# Patient Record
Sex: Male | Born: 1960
Health system: Southern US, Community
[De-identification: ages and names within clinical notes are randomized; demographics above are authoritative.]

## PROBLEM LIST (undated history)

## (undated) DIAGNOSIS — N429 Disorder of prostate, unspecified: Secondary | ICD-10-CM

## (undated) DIAGNOSIS — E785 Hyperlipidemia, unspecified: Secondary | ICD-10-CM

## (undated) DIAGNOSIS — R079 Chest pain, unspecified: Secondary | ICD-10-CM

## (undated) DIAGNOSIS — M25551 Pain in right hip: Secondary | ICD-10-CM

## (undated) DIAGNOSIS — E119 Type 2 diabetes mellitus without complications: Secondary | ICD-10-CM

## (undated) DIAGNOSIS — M1990 Unspecified osteoarthritis, unspecified site: Secondary | ICD-10-CM

## (undated) DIAGNOSIS — G4730 Sleep apnea, unspecified: Secondary | ICD-10-CM

## (undated) DIAGNOSIS — I10 Essential (primary) hypertension: Secondary | ICD-10-CM

## (undated) DIAGNOSIS — N200 Calculus of kidney: Secondary | ICD-10-CM

## (undated) HISTORY — DX: Essential (primary) hypertension: I10

## (undated) HISTORY — DX: Chest pain, unspecified: R07.9

## (undated) HISTORY — DX: Unspecified osteoarthritis, unspecified site: M19.90

## (undated) HISTORY — DX: Type 2 diabetes mellitus without complications: E11.9

## (undated) HISTORY — PX: INGUINAL HERNIA REPAIR: SUR1180

## (undated) HISTORY — PX: ROTATOR CUFF REPAIR: SHX139

## (undated) HISTORY — PX: APPENDECTOMY: SHX54

## (undated) HISTORY — DX: Pain in right hip: M25.551

## (undated) HISTORY — PX: NASAL RECONSTRUCTION: SHX2069

## (undated) HISTORY — DX: Sleep apnea, unspecified: G47.30

## (undated) HISTORY — PX: FINGER SURGERY: SHX640

## (undated) HISTORY — PX: BRAIN SURGERY: SHX531

## (undated) HISTORY — DX: Hyperlipidemia, unspecified: E78.5

## (undated) HISTORY — DX: Calculus of kidney: N20.0

## (undated) HISTORY — PX: HERNIA REPAIR: SHX51

---

## 1986-12-31 HISTORY — PX: BRAIN SURGERY: SHX531

## 1997-12-31 HISTORY — PX: OTHER SURGICAL HISTORY: SHX169

## 1998-07-28 ENCOUNTER — Emergency Department (HOSPITAL_COMMUNITY): Admission: EM | Admit: 1998-07-28 | Discharge: 1998-07-28 | Payer: Self-pay | Admitting: Emergency Medicine

## 1998-09-15 ENCOUNTER — Emergency Department (HOSPITAL_COMMUNITY): Admission: EM | Admit: 1998-09-15 | Discharge: 1998-09-15 | Payer: Self-pay | Admitting: Emergency Medicine

## 1998-10-17 ENCOUNTER — Emergency Department (HOSPITAL_COMMUNITY): Admission: EM | Admit: 1998-10-17 | Discharge: 1998-10-17 | Payer: Self-pay | Admitting: Emergency Medicine

## 1998-12-01 ENCOUNTER — Encounter: Admission: RE | Admit: 1998-12-01 | Discharge: 1998-12-01 | Payer: Self-pay | Admitting: Internal Medicine

## 1999-02-02 ENCOUNTER — Encounter: Payer: Self-pay | Admitting: Emergency Medicine

## 1999-02-02 ENCOUNTER — Emergency Department (HOSPITAL_COMMUNITY): Admission: EM | Admit: 1999-02-02 | Discharge: 1999-02-03 | Payer: Self-pay | Admitting: Emergency Medicine

## 1999-02-03 ENCOUNTER — Ambulatory Visit (HOSPITAL_COMMUNITY): Admission: RE | Admit: 1999-02-03 | Discharge: 1999-02-03 | Payer: Self-pay | Admitting: Internal Medicine

## 1999-02-03 ENCOUNTER — Encounter: Admission: RE | Admit: 1999-02-03 | Discharge: 1999-02-03 | Payer: Self-pay | Admitting: Internal Medicine

## 1999-07-13 ENCOUNTER — Encounter: Admission: RE | Admit: 1999-07-13 | Discharge: 1999-07-13 | Payer: Self-pay | Admitting: Hematology and Oncology

## 1999-07-13 ENCOUNTER — Ambulatory Visit (HOSPITAL_COMMUNITY): Admission: RE | Admit: 1999-07-13 | Discharge: 1999-07-13 | Payer: Self-pay | Admitting: Hematology and Oncology

## 2000-02-01 ENCOUNTER — Encounter: Admission: RE | Admit: 2000-02-01 | Discharge: 2000-02-01 | Payer: Self-pay | Admitting: Internal Medicine

## 2000-02-07 ENCOUNTER — Encounter: Payer: Self-pay | Admitting: Emergency Medicine

## 2000-02-07 ENCOUNTER — Emergency Department (HOSPITAL_COMMUNITY): Admission: EM | Admit: 2000-02-07 | Discharge: 2000-02-08 | Payer: Self-pay | Admitting: Emergency Medicine

## 2000-03-04 ENCOUNTER — Encounter: Admission: RE | Admit: 2000-03-04 | Discharge: 2000-03-04 | Payer: Self-pay | Admitting: Internal Medicine

## 2000-04-23 ENCOUNTER — Emergency Department (HOSPITAL_COMMUNITY): Admission: EM | Admit: 2000-04-23 | Discharge: 2000-04-23 | Payer: Self-pay | Admitting: Emergency Medicine

## 2000-04-23 ENCOUNTER — Encounter: Payer: Self-pay | Admitting: Emergency Medicine

## 2000-04-28 ENCOUNTER — Encounter: Payer: Self-pay | Admitting: *Deleted

## 2000-04-28 ENCOUNTER — Ambulatory Visit (HOSPITAL_COMMUNITY): Admission: RE | Admit: 2000-04-28 | Discharge: 2000-04-28 | Payer: Self-pay | Admitting: *Deleted

## 2000-05-30 ENCOUNTER — Encounter: Admission: RE | Admit: 2000-05-30 | Discharge: 2000-05-30 | Payer: Self-pay | Admitting: Internal Medicine

## 2000-07-01 ENCOUNTER — Encounter: Admission: RE | Admit: 2000-07-01 | Discharge: 2000-07-01 | Payer: Self-pay

## 2000-08-05 ENCOUNTER — Encounter: Admission: RE | Admit: 2000-08-05 | Discharge: 2000-08-05 | Payer: Self-pay | Admitting: Internal Medicine

## 2000-08-16 ENCOUNTER — Encounter: Admission: RE | Admit: 2000-08-16 | Discharge: 2000-08-16 | Payer: Self-pay | Admitting: Internal Medicine

## 2000-09-23 ENCOUNTER — Encounter: Admission: RE | Admit: 2000-09-23 | Discharge: 2000-09-23 | Payer: Self-pay | Admitting: Internal Medicine

## 2000-10-16 ENCOUNTER — Emergency Department (HOSPITAL_COMMUNITY): Admission: EM | Admit: 2000-10-16 | Discharge: 2000-10-16 | Payer: Self-pay | Admitting: Emergency Medicine

## 2000-10-16 ENCOUNTER — Encounter: Payer: Self-pay | Admitting: Emergency Medicine

## 2000-10-31 ENCOUNTER — Emergency Department (HOSPITAL_COMMUNITY): Admission: EM | Admit: 2000-10-31 | Discharge: 2000-10-31 | Payer: Self-pay | Admitting: Emergency Medicine

## 2000-10-31 ENCOUNTER — Encounter: Payer: Self-pay | Admitting: Emergency Medicine

## 2000-11-18 ENCOUNTER — Encounter: Admission: RE | Admit: 2000-11-18 | Discharge: 2000-11-18 | Payer: Self-pay | Admitting: Internal Medicine

## 2001-03-24 ENCOUNTER — Encounter: Admission: RE | Admit: 2001-03-24 | Discharge: 2001-03-24 | Payer: Self-pay | Admitting: Internal Medicine

## 2001-06-02 ENCOUNTER — Encounter: Admission: RE | Admit: 2001-06-02 | Discharge: 2001-06-02 | Payer: Self-pay | Admitting: Internal Medicine

## 2001-10-09 ENCOUNTER — Encounter: Admission: RE | Admit: 2001-10-09 | Discharge: 2001-10-09 | Payer: Self-pay

## 2001-12-02 ENCOUNTER — Encounter: Admission: RE | Admit: 2001-12-02 | Discharge: 2001-12-02 | Payer: Self-pay | Admitting: Internal Medicine

## 2002-03-05 ENCOUNTER — Encounter: Admission: RE | Admit: 2002-03-05 | Discharge: 2002-03-05 | Payer: Self-pay

## 2002-06-02 ENCOUNTER — Encounter: Admission: RE | Admit: 2002-06-02 | Discharge: 2002-06-02 | Payer: Self-pay | Admitting: Internal Medicine

## 2002-08-20 ENCOUNTER — Encounter: Admission: RE | Admit: 2002-08-20 | Discharge: 2002-08-20 | Payer: Self-pay | Admitting: Internal Medicine

## 2002-10-21 ENCOUNTER — Encounter: Admission: RE | Admit: 2002-10-21 | Discharge: 2002-10-21 | Payer: Self-pay | Admitting: Internal Medicine

## 2002-12-11 ENCOUNTER — Encounter: Admission: RE | Admit: 2002-12-11 | Discharge: 2002-12-11 | Payer: Self-pay | Admitting: Internal Medicine

## 2003-05-28 ENCOUNTER — Encounter: Admission: RE | Admit: 2003-05-28 | Discharge: 2003-05-28 | Payer: Self-pay | Admitting: Internal Medicine

## 2003-06-14 ENCOUNTER — Encounter: Admission: RE | Admit: 2003-06-14 | Discharge: 2003-06-14 | Payer: Self-pay | Admitting: Internal Medicine

## 2003-07-16 ENCOUNTER — Encounter: Admission: RE | Admit: 2003-07-16 | Discharge: 2003-07-16 | Payer: Self-pay | Admitting: Internal Medicine

## 2003-07-22 ENCOUNTER — Encounter: Admission: RE | Admit: 2003-07-22 | Discharge: 2003-07-22 | Payer: Self-pay | Admitting: Internal Medicine

## 2003-08-19 ENCOUNTER — Ambulatory Visit (HOSPITAL_COMMUNITY): Admission: RE | Admit: 2003-08-19 | Discharge: 2003-08-19 | Payer: Self-pay | Admitting: Cardiology

## 2003-08-19 ENCOUNTER — Encounter: Payer: Self-pay | Admitting: Cardiology

## 2003-09-02 ENCOUNTER — Encounter: Admission: RE | Admit: 2003-09-02 | Discharge: 2003-09-02 | Payer: Self-pay | Admitting: Internal Medicine

## 2003-09-02 ENCOUNTER — Ambulatory Visit (HOSPITAL_COMMUNITY): Admission: RE | Admit: 2003-09-02 | Discharge: 2003-09-02 | Payer: Self-pay | Admitting: Internal Medicine

## 2003-10-06 ENCOUNTER — Encounter: Admission: RE | Admit: 2003-10-06 | Discharge: 2003-10-06 | Payer: Self-pay | Admitting: Internal Medicine

## 2003-11-12 ENCOUNTER — Encounter: Admission: RE | Admit: 2003-11-12 | Discharge: 2003-11-12 | Payer: Self-pay | Admitting: Internal Medicine

## 2003-12-09 ENCOUNTER — Emergency Department (HOSPITAL_COMMUNITY): Admission: EM | Admit: 2003-12-09 | Discharge: 2003-12-09 | Payer: Self-pay | Admitting: Emergency Medicine

## 2003-12-16 ENCOUNTER — Encounter: Admission: RE | Admit: 2003-12-16 | Discharge: 2003-12-16 | Payer: Self-pay | Admitting: Internal Medicine

## 2003-12-30 ENCOUNTER — Encounter: Admission: RE | Admit: 2003-12-30 | Discharge: 2003-12-30 | Payer: Self-pay | Admitting: Internal Medicine

## 2004-01-21 ENCOUNTER — Encounter: Admission: RE | Admit: 2004-01-21 | Discharge: 2004-01-21 | Payer: Self-pay | Admitting: Internal Medicine

## 2004-02-22 ENCOUNTER — Emergency Department (HOSPITAL_COMMUNITY): Admission: AD | Admit: 2004-02-22 | Discharge: 2004-02-23 | Payer: Self-pay | Admitting: Emergency Medicine

## 2004-02-25 ENCOUNTER — Ambulatory Visit (HOSPITAL_COMMUNITY): Admission: RE | Admit: 2004-02-25 | Discharge: 2004-02-25 | Payer: Self-pay | Admitting: Internal Medicine

## 2004-02-25 ENCOUNTER — Encounter: Admission: RE | Admit: 2004-02-25 | Discharge: 2004-02-25 | Payer: Self-pay | Admitting: Internal Medicine

## 2004-06-01 ENCOUNTER — Encounter: Admission: RE | Admit: 2004-06-01 | Discharge: 2004-06-01 | Payer: Self-pay | Admitting: Internal Medicine

## 2004-10-18 ENCOUNTER — Ambulatory Visit: Payer: Self-pay | Admitting: Internal Medicine

## 2004-11-16 ENCOUNTER — Emergency Department (HOSPITAL_COMMUNITY): Admission: EM | Admit: 2004-11-16 | Discharge: 2004-11-16 | Payer: Self-pay | Admitting: Emergency Medicine

## 2005-01-17 ENCOUNTER — Ambulatory Visit: Payer: Self-pay | Admitting: Internal Medicine

## 2005-02-01 ENCOUNTER — Ambulatory Visit: Payer: Self-pay | Admitting: Internal Medicine

## 2005-04-06 ENCOUNTER — Ambulatory Visit: Payer: Self-pay | Admitting: Internal Medicine

## 2005-05-17 ENCOUNTER — Ambulatory Visit: Payer: Self-pay | Admitting: Internal Medicine

## 2005-05-30 ENCOUNTER — Ambulatory Visit (HOSPITAL_COMMUNITY): Admission: RE | Admit: 2005-05-30 | Discharge: 2005-05-30 | Payer: Self-pay | Admitting: Internal Medicine

## 2005-06-05 ENCOUNTER — Ambulatory Visit: Payer: Self-pay | Admitting: Internal Medicine

## 2005-06-22 ENCOUNTER — Ambulatory Visit (HOSPITAL_COMMUNITY): Admission: RE | Admit: 2005-06-22 | Discharge: 2005-06-22 | Payer: Self-pay | Admitting: Urology

## 2005-07-02 ENCOUNTER — Ambulatory Visit: Payer: Self-pay | Admitting: Internal Medicine

## 2005-07-26 ENCOUNTER — Emergency Department (HOSPITAL_COMMUNITY): Admission: EM | Admit: 2005-07-26 | Discharge: 2005-07-26 | Payer: Self-pay | Admitting: Emergency Medicine

## 2005-08-27 ENCOUNTER — Ambulatory Visit (HOSPITAL_BASED_OUTPATIENT_CLINIC_OR_DEPARTMENT_OTHER): Admission: RE | Admit: 2005-08-27 | Discharge: 2005-08-27 | Payer: Self-pay | Admitting: Surgery

## 2005-08-27 ENCOUNTER — Ambulatory Visit (HOSPITAL_COMMUNITY): Admission: RE | Admit: 2005-08-27 | Discharge: 2005-08-27 | Payer: Self-pay | Admitting: Surgery

## 2005-10-01 ENCOUNTER — Ambulatory Visit: Payer: Self-pay | Admitting: Hospitalist

## 2005-10-18 ENCOUNTER — Encounter: Payer: Self-pay | Admitting: Internal Medicine

## 2005-10-18 ENCOUNTER — Ambulatory Visit (HOSPITAL_COMMUNITY): Admission: RE | Admit: 2005-10-18 | Discharge: 2005-10-18 | Payer: Self-pay | Admitting: Gastroenterology

## 2005-12-27 ENCOUNTER — Ambulatory Visit: Payer: Self-pay | Admitting: Internal Medicine

## 2006-02-05 ENCOUNTER — Ambulatory Visit: Payer: Self-pay | Admitting: Internal Medicine

## 2006-02-27 ENCOUNTER — Ambulatory Visit: Payer: Self-pay | Admitting: Internal Medicine

## 2006-04-17 ENCOUNTER — Ambulatory Visit: Payer: Self-pay | Admitting: Internal Medicine

## 2006-04-22 ENCOUNTER — Ambulatory Visit: Payer: Self-pay | Admitting: Internal Medicine

## 2006-05-01 ENCOUNTER — Encounter: Payer: Self-pay | Admitting: Emergency Medicine

## 2006-06-06 ENCOUNTER — Ambulatory Visit: Payer: Self-pay | Admitting: Internal Medicine

## 2006-09-20 ENCOUNTER — Ambulatory Visit: Payer: Self-pay | Admitting: Hospitalist

## 2006-10-18 DIAGNOSIS — E785 Hyperlipidemia, unspecified: Secondary | ICD-10-CM

## 2006-10-18 DIAGNOSIS — D443 Neoplasm of uncertain behavior of pituitary gland: Secondary | ICD-10-CM | POA: Insufficient documentation

## 2006-10-18 DIAGNOSIS — I10 Essential (primary) hypertension: Secondary | ICD-10-CM

## 2006-10-18 DIAGNOSIS — D444 Neoplasm of uncertain behavior of craniopharyngeal duct: Secondary | ICD-10-CM

## 2006-10-18 DIAGNOSIS — G9050 Complex regional pain syndrome I, unspecified: Secondary | ICD-10-CM | POA: Insufficient documentation

## 2006-10-18 DIAGNOSIS — F411 Generalized anxiety disorder: Secondary | ICD-10-CM | POA: Insufficient documentation

## 2006-10-18 DIAGNOSIS — Z87442 Personal history of urinary calculi: Secondary | ICD-10-CM

## 2006-10-18 DIAGNOSIS — G4700 Insomnia, unspecified: Secondary | ICD-10-CM | POA: Insufficient documentation

## 2006-10-18 DIAGNOSIS — K219 Gastro-esophageal reflux disease without esophagitis: Secondary | ICD-10-CM

## 2006-10-18 HISTORY — DX: Hyperlipidemia, unspecified: E78.5

## 2006-12-03 DIAGNOSIS — G8929 Other chronic pain: Secondary | ICD-10-CM

## 2006-12-03 DIAGNOSIS — R197 Diarrhea, unspecified: Secondary | ICD-10-CM

## 2006-12-03 DIAGNOSIS — F1011 Alcohol abuse, in remission: Secondary | ICD-10-CM

## 2007-02-27 ENCOUNTER — Encounter: Payer: Self-pay | Admitting: Internal Medicine

## 2007-02-27 ENCOUNTER — Inpatient Hospital Stay (HOSPITAL_COMMUNITY): Admission: EM | Admit: 2007-02-27 | Discharge: 2007-03-02 | Payer: Self-pay | Admitting: Emergency Medicine

## 2007-03-02 ENCOUNTER — Encounter (INDEPENDENT_AMBULATORY_CARE_PROVIDER_SITE_OTHER): Payer: Self-pay | Admitting: *Deleted

## 2007-05-05 ENCOUNTER — Telehealth (INDEPENDENT_AMBULATORY_CARE_PROVIDER_SITE_OTHER): Payer: Self-pay | Admitting: *Deleted

## 2007-05-21 ENCOUNTER — Telehealth (INDEPENDENT_AMBULATORY_CARE_PROVIDER_SITE_OTHER): Payer: Self-pay | Admitting: *Deleted

## 2007-06-02 ENCOUNTER — Ambulatory Visit: Payer: Self-pay | Admitting: Internal Medicine

## 2007-06-02 DIAGNOSIS — R109 Unspecified abdominal pain: Secondary | ICD-10-CM | POA: Insufficient documentation

## 2007-06-02 DIAGNOSIS — F17200 Nicotine dependence, unspecified, uncomplicated: Secondary | ICD-10-CM

## 2008-01-15 ENCOUNTER — Telehealth: Payer: Self-pay | Admitting: Infectious Disease

## 2008-07-06 ENCOUNTER — Encounter (INDEPENDENT_AMBULATORY_CARE_PROVIDER_SITE_OTHER): Payer: Self-pay | Admitting: Internal Medicine

## 2008-07-06 ENCOUNTER — Ambulatory Visit: Payer: Self-pay | Admitting: Internal Medicine

## 2008-07-07 LAB — CONVERTED CEMR LAB
ALT: 22 units/L (ref 0–53)
AST: 17 units/L (ref 0–37)
Albumin: 4.1 g/dL (ref 3.5–5.2)
Alkaline Phosphatase: 85 units/L (ref 39–117)
BUN: 13 mg/dL (ref 6–23)
CO2: 26 meq/L (ref 19–32)
Calcium: 8.9 mg/dL (ref 8.4–10.5)
Chloride: 106 meq/L (ref 96–112)
Cholesterol: 217 mg/dL — ABNORMAL HIGH (ref 0–200)
Creatinine, Ser: 0.82 mg/dL (ref 0.40–1.50)
Glucose, Bld: 91 mg/dL (ref 70–99)
HDL: 38 mg/dL — ABNORMAL LOW (ref >39)
LDL Cholesterol: 116 mg/dL — ABNORMAL HIGH (ref 0–99)
Potassium: 4.2 meq/L (ref 3.5–5.3)
Sodium: 143 meq/L (ref 135–145)
Total Bilirubin: 0.4 mg/dL (ref 0.3–1.2)
Total CHOL/HDL Ratio: 5.7
Total Protein: 6.4 g/dL (ref 6.0–8.3)
Triglycerides: 317 mg/dL — ABNORMAL HIGH (ref <150)
VLDL: 63 mg/dL — ABNORMAL HIGH (ref 0–40)

## 2008-07-13 ENCOUNTER — Encounter (INDEPENDENT_AMBULATORY_CARE_PROVIDER_SITE_OTHER): Payer: Self-pay | Admitting: Internal Medicine

## 2008-07-13 ENCOUNTER — Ambulatory Visit: Payer: Self-pay | Admitting: Internal Medicine

## 2008-07-13 DIAGNOSIS — M25519 Pain in unspecified shoulder: Secondary | ICD-10-CM

## 2008-07-13 LAB — CONVERTED CEMR LAB
ALT: 27 units/L (ref 0–53)
AST: 21 units/L (ref 0–37)
Albumin: 4.1 g/dL (ref 3.5–5.2)
Alkaline Phosphatase: 81 units/L (ref 39–117)
BUN: 11 mg/dL (ref 6–23)
CO2: 26 meq/L (ref 19–32)
Calcium: 9.2 mg/dL (ref 8.4–10.5)
Chloride: 106 meq/L (ref 96–112)
Creatinine, Ser: 0.83 mg/dL (ref 0.40–1.50)
Glucose, Bld: 89 mg/dL (ref 70–99)
Potassium: 4.2 meq/L (ref 3.5–5.3)
Sodium: 144 meq/L (ref 135–145)
Total Bilirubin: 0.5 mg/dL (ref 0.3–1.2)
Total Protein: 6.5 g/dL (ref 6.0–8.3)

## 2008-09-06 ENCOUNTER — Encounter (INDEPENDENT_AMBULATORY_CARE_PROVIDER_SITE_OTHER): Payer: Self-pay | Admitting: Internal Medicine

## 2008-09-13 ENCOUNTER — Ambulatory Visit: Payer: Self-pay | Admitting: Internal Medicine

## 2008-09-13 ENCOUNTER — Encounter (INDEPENDENT_AMBULATORY_CARE_PROVIDER_SITE_OTHER): Payer: Self-pay | Admitting: Internal Medicine

## 2008-09-13 LAB — CONVERTED CEMR LAB

## 2008-09-14 ENCOUNTER — Encounter (INDEPENDENT_AMBULATORY_CARE_PROVIDER_SITE_OTHER): Payer: Self-pay | Admitting: Internal Medicine

## 2008-09-16 ENCOUNTER — Telehealth: Payer: Self-pay | Admitting: *Deleted

## 2008-09-27 ENCOUNTER — Encounter (INDEPENDENT_AMBULATORY_CARE_PROVIDER_SITE_OTHER): Payer: Self-pay | Admitting: Internal Medicine

## 2008-09-27 ENCOUNTER — Ambulatory Visit: Payer: Self-pay | Admitting: Internal Medicine

## 2008-09-27 ENCOUNTER — Ambulatory Visit (HOSPITAL_COMMUNITY): Admission: RE | Admit: 2008-09-27 | Discharge: 2008-09-27 | Payer: Self-pay | Admitting: Internal Medicine

## 2008-09-27 DIAGNOSIS — Z9189 Other specified personal risk factors, not elsewhere classified: Secondary | ICD-10-CM | POA: Insufficient documentation

## 2008-09-27 LAB — CONVERTED CEMR LAB
ALT: 33 units/L (ref 0–53)
AST: 18 units/L (ref 0–37)
Albumin: 4.4 g/dL (ref 3.5–5.2)
Alkaline Phosphatase: 82 units/L (ref 39–117)
BUN: 16 mg/dL (ref 6–23)
Basophils Absolute: 0 10*3/uL (ref 0.0–0.1)
Basophils Relative: 0 % (ref 0–1)
Bilirubin Urine: NEGATIVE
CO2: 22 meq/L (ref 19–32)
Calcium: 9.2 mg/dL (ref 8.4–10.5)
Chloride: 106 meq/L (ref 96–112)
Creatinine, Ser: 1 mg/dL (ref 0.40–1.50)
Eosinophils Absolute: 0.2 10*3/uL (ref 0.0–0.7)
Eosinophils Relative: 2 % (ref 0–5)
Glucose, Bld: 93 mg/dL (ref 70–99)
HCT: 44.9 % (ref 39.0–52.0)
HIV 1 RNA Quant: <50 copies/mL (ref <50)
HIV-1 RNA Quant, Log: <1.7 (ref <1.70)
Hemoglobin, Urine: NEGATIVE
Hemoglobin: 14.8 g/dL (ref 13.0–17.0)
Ketones, ur: NEGATIVE mg/dL
Leukocytes, UA: NEGATIVE
Lymphocytes Relative: 27 % (ref 12–46)
Lymphs Abs: 2.8 10*3/uL (ref 0.7–4.0)
MCHC: 33 g/dL (ref 30.0–36.0)
MCV: 95.7 fL (ref 78.0–100.0)
Monocytes Absolute: 0.7 10*3/uL (ref 0.1–1.0)
Monocytes Relative: 7 % (ref 3–12)
Neutro Abs: 6.6 10*3/uL (ref 1.7–7.7)
Neutrophils Relative %: 65 % (ref 43–77)
Nitrite: NEGATIVE
Platelets: 221 10*3/uL (ref 150–400)
Potassium: 4 meq/L (ref 3.5–5.3)
Protein, ur: NEGATIVE mg/dL
RBC: 4.69 M/uL (ref 4.22–5.81)
RDW: 13.4 % (ref 11.5–15.5)
Sodium: 143 meq/L (ref 135–145)
Specific Gravity, Urine: 1.041 — ABNORMAL HIGH (ref 1.005–1.03)
Total Bilirubin: 0.4 mg/dL (ref 0.3–1.2)
Total Protein: 6.7 g/dL (ref 6.0–8.3)
Urine Glucose: NEGATIVE mg/dL
Urobilinogen, UA: 0.2 (ref 0.0–1.0)
WBC: 10.2 10*3/uL (ref 4.0–10.5)
pH: 5.5 (ref 5.0–8.0)

## 2008-10-08 ENCOUNTER — Ambulatory Visit: Payer: Self-pay | Admitting: Internal Medicine

## 2008-10-08 DIAGNOSIS — R1319 Other dysphagia: Secondary | ICD-10-CM | POA: Insufficient documentation

## 2008-10-19 ENCOUNTER — Ambulatory Visit: Payer: Self-pay | Admitting: Internal Medicine

## 2008-10-27 ENCOUNTER — Encounter: Payer: Self-pay | Admitting: Internal Medicine

## 2008-10-27 ENCOUNTER — Ambulatory Visit: Payer: Self-pay | Admitting: Internal Medicine

## 2008-10-27 LAB — CONVERTED CEMR LAB: UREASE: NEGATIVE

## 2008-10-28 LAB — HM COLONOSCOPY

## 2008-11-01 ENCOUNTER — Encounter: Payer: Self-pay | Admitting: Internal Medicine

## 2009-05-31 ENCOUNTER — Telehealth (INDEPENDENT_AMBULATORY_CARE_PROVIDER_SITE_OTHER): Payer: Self-pay | Admitting: Internal Medicine

## 2009-06-10 ENCOUNTER — Telehealth: Payer: Self-pay | Admitting: *Deleted

## 2010-06-14 ENCOUNTER — Telehealth: Payer: Self-pay | Admitting: Internal Medicine

## 2010-07-21 ENCOUNTER — Telehealth: Payer: Self-pay | Admitting: Internal Medicine

## 2010-07-31 ENCOUNTER — Ambulatory Visit: Admission: RE | Admit: 2010-07-31 | Discharge: 2010-07-31 | Payer: Self-pay | Admitting: Internal Medicine

## 2010-07-31 ENCOUNTER — Ambulatory Visit: Payer: Self-pay | Admitting: Internal Medicine

## 2010-07-31 ENCOUNTER — Encounter: Payer: Self-pay | Admitting: Internal Medicine

## 2010-07-31 LAB — CONVERTED CEMR LAB
Amphetamine Screen, Ur: NEGATIVE
Barbiturate Quant, Ur: NEGATIVE
Benzodiazepines.: NEGATIVE
Bilirubin Urine: NEGATIVE
Casts: NONE SEEN /lpf
Cocaine Metabolites: NEGATIVE
Creatinine, Urine: 279.5 mg/dL
Creatinine,U: 283.7 mg/dL
Ketones, ur: NEGATIVE mg/dL
Leukocytes, UA: NEGATIVE
Marijuana Metabolite: NEGATIVE
Methadone: NEGATIVE
Microalb Creat Ratio: 9.3 mg/g (ref 0.0–30.0)
Microalb, Ur: 2.59 mg/dL — ABNORMAL HIGH (ref 0.00–1.89)
Nitrite: NEGATIVE
Opiates: NEGATIVE
Phencyclidine (PCP): NEGATIVE
Propoxyphene: NEGATIVE
Protein, ur: NEGATIVE mg/dL
Specific Gravity, Urine: 1.034 — ABNORMAL HIGH (ref 1.005–1.0)
Squamous Epithelial / HPF: NONE SEEN /lpf
Urine Glucose: NEGATIVE mg/dL
Urobilinogen, UA: 1 (ref 0.0–1.0)
pH: 6 (ref 5.0–8.0)

## 2010-08-09 ENCOUNTER — Telehealth: Payer: Self-pay | Admitting: *Deleted

## 2010-09-18 ENCOUNTER — Telehealth: Payer: Self-pay | Admitting: *Deleted

## 2010-09-19 ENCOUNTER — Ambulatory Visit: Payer: Self-pay | Admitting: Internal Medicine

## 2010-09-19 LAB — CONVERTED CEMR LAB
ALT: 65 units/L — ABNORMAL HIGH (ref 0–53)
AST: 35 units/L (ref 0–37)
Albumin: 4.2 g/dL (ref 3.5–5.2)
Alkaline Phosphatase: 91 units/L (ref 39–117)
BUN: 14 mg/dL (ref 6–23)
CO2: 27 meq/L (ref 19–32)
Calcium: 8.9 mg/dL (ref 8.4–10.5)
Chloride: 106 meq/L (ref 96–112)
Cholesterol: 170 mg/dL (ref 0–200)
Creatinine, Ser: 0.92 mg/dL (ref 0.40–1.50)
Glucose, Bld: 105 mg/dL — ABNORMAL HIGH (ref 70–99)
HCT: 45.4 % (ref 39.0–52.0)
HDL: 38 mg/dL — ABNORMAL LOW (ref >39)
Hemoglobin: 15.5 g/dL (ref 13.0–17.0)
LDL Cholesterol: 112 mg/dL — ABNORMAL HIGH (ref 0–99)
MCHC: 34.1 g/dL (ref 30.0–36.0)
MCV: 94 fL (ref >=78.0)
Platelets: 193 10*3/uL (ref 150–400)
Potassium: 4.2 meq/L (ref 3.5–5.3)
RBC: 4.83 M/uL (ref 4.22–5.81)
RDW: 13.1 % (ref 11.5–15.5)
Sodium: 141 meq/L (ref 135–145)
TSH: 0.898 microintl units/mL (ref 0.350–4.5)
Total Bilirubin: 0.6 mg/dL (ref 0.3–1.2)
Total CHOL/HDL Ratio: 4.5
Total Protein: 6.2 g/dL (ref 6.0–8.3)
Triglycerides: 102 mg/dL (ref <150)
VLDL: 20 mg/dL (ref 0–40)
WBC: 5.8 10*3/uL (ref 4.0–10.5)

## 2011-01-30 NOTE — Progress Notes (Signed)
Summary: refill/ hla  Phone Note Refill Request Message from:  Fax from Pharmacy on June 14, 2010 10:35 AM  Refills Requested: Medication #1:  LISINOPRIL 5 MG  TABS take 1 pill by mouth daily.   Last Refilled: 4/3 last visit 09/2008  Initial call taken by: Marin Roberts RN,  June 14, 2010 10:35 AM  Follow-up for Phone Call        Pt last seen 2009.  Will give 30 day supply.  Pt needs appt for F/U and labs.  Sent flag to [facility] Follow-up by: Blanch Media MD,  June 14, 2010 10:47 AM    Prescriptions: LISINOPRIL 5 MG  TABS (LISINOPRIL) take 1 pill by mouth daily.  #30 x 0   Entered and Authorized by:   Blanch Media MD   Signed by:   Blanch Media MD on 06/14/2010   Method used:   [facility]        [facility] [phone]* (retail)       [address]       [place], [place]  [address]       Ph: [phone] or [phone]       Fax: [phone]   RxID:   [id number]   Appended Document: refill/ hla MS Lissa Hoard called pt - no car,no driver's lincense, no money for taxi.  I sent flag to Lorri Frederick to see if any transportation resources to get pt here for F/U

## 2011-01-30 NOTE — Assessment & Plan Note (Signed)
Summary: ACUTE-GENERAL CHECK-UP UNASSIGNED/CFB   Vital Signs:  Patient profile:   51 year old male Height:      69 inches (175.26 cm) Weight:      210.2 pounds (95.55 kg) BMI:     31.15 Temp:     97.0 degrees F (36.11 degrees C) oral Pulse rate:   74 / minute BP sitting:   153 / 92  (right arm) Cuff size:   large  Vitals Entered By: Theotis Barrio NT II (July 31, 2010 3:58 PM) CC: MEDICATION REFILL / BILATERAL ANKLE AND FEET PAIN / UNABLE TO SLEEP AT NIGHT / JOINT PAIN LEFT MIDDLE FINGER / LOWER BACK PAIN / HEAT BURN Is Patient Diabetic? No Pain Assessment Patient in pain? yes     Location: BACK/ANKLE Intensity:      9 Type: PAIN Onset of pain  Chronic Nutritional Status BMI of > 30 = obese  Have you ever been in a relationship where you felt threatened, hurt or afraid?No   Does patient need assistance? Functional Status Self care Ambulation Normal   CC:  MEDICATION REFILL / BILATERAL ANKLE AND FEET PAIN / UNABLE TO SLEEP AT NIGHT / JOINT PAIN LEFT MIDDLE FINGER / LOWER BACK PAIN / HEAT BURN.  History of Present Illness: 1. HTN -was not taking any medications for "months." Denies any HA/visual changes or weakness.. No exercise and salt. 2. Heartburn. Hx of GERD. 3. Chronic foot pain.  Preventive Screening-Counseling & Management  Alcohol-Tobacco     Smoking Status: current     Smoking Cessation Counseling: yes     Packs/Day: 3 packs aday     Year Started: 33years  Caffeine-Diet-Exercise     Does Patient Exercise: no  Problems Prior to Update: 1)  Dysphagia  (ICD-787.29) 2)  Fever, Hx of  (ICD-V15.9) 3)  Preventive Health Care  (ICD-V70.0) 4)  Shoulder Pain, Left  (ICD-719.41) 5)  Tobacco Abuse  (ICD-305.1) 6)  Abdominal Pain, Chronic  (ICD-789.00) 7)  Hypertension  (ICD-401.9) 8)  Pain, Chronic Nec  (ICD-338.29) 9)  Dyslipidemia  (ICD-272.4) 10)  Gerd  (ICD-530.81) 11)  Anxiety  (ICD-300.00) 12)  Diarrhea, Chronic  (ICD-787.91) 13)  Insomnia   (ICD-780.52) 14)  Renal Calculus, Hx of  (ICD-V13.01) 15)  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) 16)  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) 17)  Abuse, Alcohol, 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)  Current Problems (verified): 1)  Dysphagia  (ICD-787.29) 2)  Fever, Hx of  (ICD-V15.9) 3)  Preventive Health Care  (ICD-V70.0) 4)  Shoulder Pain, Left  (ICD-719.41) 5)  Tobacco Abuse  (ICD-305.1) 6)  Abdominal Pain, Chronic  (ICD-789.00) 7)  Hypertension  (ICD-401.9) 8)  Pain, Chronic Nec  (ICD-338.29) 9)  Dyslipidemia  (ICD-272.4) 10)  Gerd  (ICD-530.81) 11)  Anxiety  (ICD-300.00) 12)  Diarrhea, Chronic  (ICD-787.91) 13)  Insomnia  (ICD-780.52) 14)  Renal Calculus, Hx of  (ICD-V13.01) 15)  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 Take 1 Pill By Mouth Daily. 2)  Ativan 1 Mg Tabs (Lorazepam) .... Take 1 Tablet By Mouth Once A Day 3)  Famotidine 10 Mg  Tabs (Famotidine) .... Take 1 Pill By Mouth Daily 4)  Pravachol 10 Mg  Tabs (Pravastatin Sodium) .... Take 1 Pill By Mouth Daily. 5)  Aspirin 81 Mg Chew (Aspirin) .... Take 1 Tablet By Mouth Once A Day 6)  Cardura 8 Mg  Tabs (Doxazosin Mesylate) .... Per His Urologist. 7)  Tramadol Hcl 50 Mg Tabs (Tramadol Hcl) .... Take One Tab By Mouth Q 4 Hours Prn For Pain 8)  Omeprazole 40 Mg  Cpdr (Omeprazole) .Marland Kitchen.. 1 Each Day 30 Minutes Before Meal  Allergies (verified): 1)  ! Pcn 2)  ! Codeine  Directives (verified): 1)  Full Code   Past History:  Past Medical History: Last updated: 10/08/2008 Anxiety GERD Hypertension H/O Atypical chest pain Partial small bowel obstruction 2008 Brain tumor Hyperlipidemia  Past Surgical History: Last updated: 10/08/2008 Bilateral inguinal hernia repair Appendectomy Rotator cuff surgery, right Left ankle reconstruction Brain tumor removal -  pituitary  Family History: Last updated: 07/31/2010 Grndomother: breast cancer Mother: breast cancer Father: Hypertension  Social History: Last updated: 10/08/2008 Married, 1 son, currently separated Occupation: Tax inspector buildings Patient currently smokes.  Alcohol Use - no Illicit Drug Use - no Daily Caffeine Use 6 doses  Risk Factors: Smoking Status: current (07/31/2010) Packs/Day: 3 packs aday (07/31/2010)  Family History: Grndomother: breast cancer Mother: breast cancer Father: Hypertension  Review of Systems       per HPI  Physical Exam  General:  alert, well-nourished, overweight-appearing, dusky, disheveled, and poor hygiene.   Head:  normocephalic and atraumatic.   Eyes:  pupils equal, pupils round, pupils reactive to light, pupils react to accomodation, no injection, vision impaired R, and xerophthalmia.   Ears:  no external deformities.   Nose:  no external deformity.   Mouth:  no gingival abnormalities and pharynx pink and moist.   Neck:  Supple; no masses or thyromegaly. Lungs:  Normal respiratory effort, chest expands symmetrically. Lungs are clear to auscultation, no crackles or wheezes. Heart:  Regular rate and rhythm; no murmurs, rubs,  or bruits. Abdomen:  Right inguinal hernia otherwise soft, NT, no masses BS+ no HSM Rectal:  deferred until time of colonoscopy.   Msk:  left foot lateral aspect TTP; FROM but with acute pain ln lateral flexion;no crepitus or joint instability Pulses:  R and L carotid,radial,femoral,dorsalis pedis and posterior tibial pulses are full and equal bilaterally Extremities:  No clubbing, cyanosis, edema, or deformity noted with normal full range of motion of all joints.   Neurologic:  No cranial nerve deficits noted. Station and gait are normal. Plantar reflexes are down-going bilaterally. DTRs are symmetrical throughout. Sensory, motor and coordinative functions appear intact. Skin:  no rashes Cervical Nodes:  No  lymphadenopathy noted Axillary Nodes:  No palpable lymphadenopathy Inguinal Nodes:  No significant adenopathy Psych:  Cognition and judgment appear intact. Alert and cooperative with normal attention span and concentration. No apparent delusions, illusions, hallucinations   Impression & Recommendations:  Problem # 1:  DYSLIPIDEMIA (ICD-272.4) Low cholesterol and high fiber diet along with exercise discussed. Will f/u post lab work. His updated medication list for this problem includes:    Pravachol 10 Mg Tabs (Pravastatin sodium) .Marland Kitchen... Take 1 pill by mouth daily.  Orders: T-CMP with Estimated GFR ([id number])Future Orders: T-Lipid Profile ([id number]) ... 08/01/2010 T-TSH [phone]) ... 08/01/2010  Labs Reviewed: SGOT: 18 (09/27/2008)   SGPT: 33 (09/27/2008)   HDL:38 (07/06/2008)  LDL:116 (07/06/2008)  Chol:217 (07/06/2008)  Trig:317 (07/06/2008)  Problem # 2:  HYPERTENSION (ICD-401.9) Uncontrolled due to lack of medication for 1 month. Strongly advised to adhere with Tx regimen. Weight managment and diet adressed. His updated medication list for this problem includes:    Lisinopril 5 Mg Tabs (Lisinopril) .Marland Kitchen... Take 1 pill by mouth daily.  Cardura 8 Mg Tabs (Doxazosin mesylate) .Marland Kitchen... Per his urologist.  Orders: 12 Lead EKG (12 Lead EKG) Ophthalmology Referral (Ophthalmology) T-Urine Microalbumin w/creat. ratio [phone]) T-Urinalysis ([id number]) T-CMP with Estimated GFR ([id number])Future Orders: T-CBC No Diff ([id number]) ... 08/01/2010  BP today: 153/92 Prior BP: 130/82 (10/19/2008)  Labs Reviewed: K+: 4.0 (09/27/2008) Creat: : 1.00 (09/27/2008)   Chol: 217 (07/06/2008)   HDL: 38 (07/06/2008)   LDL: 116 (07/06/2008)   TG: 317 (07/06/2008)  Problem # 3:  TOBACCO ABUSE (ICD-305.1)  Encouraged smoking cessation and discussed different methods for smoking cessation.   Future Orders: T-CBC No Diff ([id number]) ... 08/01/2010  Problem # 4:  GERD  (ICD-530.81) Denies any weight loss, hematchezia or melena. Seen GI and had upper GI work up in the past. Will continue with opmeprazole. Patient refused a repat referral for EGD/manometry. The following medications were removed from the medication list:    Famotidine 10 Mg Tabs (Famotidine) .Marland Kitchen... Take 1 pill by mouth daily His updated medication list for this problem includes:    Omeprazole 40 Mg Cpdr (Omeprazole) .Marland Kitchen... 1 each day 30 minutes before meal  EGD: Location: [facility]   (10/27/2008)  Labs Reviewed: Hgb: 14.8 (09/27/2008)   Hct: 44.9 (09/27/2008)  Problem # 5:  PAIN, CHRONIC NEC (ICD-338.29) Left foot latereal aspect pain; hx of trauma and suergery in 1999 by Dr. Eulah Pont. Patient refused any imaging at this time but agreed to be seen by Sports Medicine. d/C tramdaol since ineffective. Given saples of Celebres -side-effects reviewed. Orders: Sports Medicine (Sports Med)  Complete Medication List: 1)  Lisinopril 5 Mg Tabs (Lisinopril) .... Take 1 pill by mouth daily. 2)  Ativan 1 Mg Tabs (Lorazepam) .... Take 1 tablet by mouth once a day 3)  Pravachol 10 Mg Tabs (Pravastatin sodium) .... Take 1 pill by mouth daily. 4)  Aspirin 81 Mg Chew (Aspirin) .... Take 1 tablet by mouth once a day 5)  Cardura 8 Mg Tabs (Doxazosin mesylate) .... Per his urologist. 6)  Omeprazole 40 Mg Cpdr (Omeprazole) .Marland Kitchen.. 1 each day 30 minutes before meal  Other Orders: T-Hemoccult Card-Multiple (take home) ([id number]) T-Drug Screen-Urine, (single) [phone])  Patient Instructions: 1)  Please, return for a blood work fasting. 2)  Nurse will call you with ayour referrals to an eye doctor and sports medicine. 3)  Call with any questions. 4)  Please, follow up in 4 weeks. Prescriptions: OMEPRAZOLE 40 MG  CPDR (OMEPRAZOLE) 1 each day 30 minutes before meal  #30 x 6   Entered and Authorized by:   Deatra Robinson MD   Signed by:   Deatra Robinson MD on 07/31/2010   Method used:    [facility]        [facility] [phone]* (retail)       [address]       [place], [place]  [address]       Ph: [phone] or [phone]       Fax: [phone]   RxID:   [id number] PRAVACHOL 10 MG  TABS (PRAVASTATIN SODIUM) take 1 pill by mouth daily.  #62 x 6   Entered and Authorized by:   Deatra Robinson MD   Signed by:   Deatra Robinson MD on 07/31/2010   Method used:   Electronically to        [facility] [phone]* (retail)       [phone]  [place], [place]  [address]       Ph: [phone] or [phone]       Fax: [phone]   RxID:   [id number] LISINOPRIL 5 MG  TABS (LISINOPRIL) take 1 pill by mouth daily.  #30 x 6   Entered and Authorized by:   Deatra Robinson MD   Signed by:   Deatra Robinson MD on 07/31/2010   Method used:   Electronically to        [facility] [phone]* (retail)       [address]       [place], [place]  [address]       Ph: [phone] or [phone]       Fax: [phone]   RxID:   [id number]  Process Orders Check Orders Results:     [facility]: ABN not required for this insurance Tests Sent for requisitioning (August 02, 2010 3:26 PM):     08/01/2010: [facility] -- T-Lipid Profile [phone] (signed)     08/01/2010: [facility] -- T-CBC No Diff [[id number]] (signed)     08/01/2010: [facility] -- T-TSH [id number] (signed)     07/31/2010: [facility] -- T-Drug Screen-Urine, (single) [[id number]] (signed)     07/31/2010: [facility] -- T-Urine Microalbumin w/creat. ratio [[id number]] (signed)     07/31/2010: [facility] -- T-Urinalysis [[id number]] (signed)     07/31/2010: [facility] -- T-CMP with Estimated GFR [[id number]] (signed)     Prevention & Chronic  Care Immunizations   Influenza vaccine: Not documented   Influenza vaccine deferral: Deferred  (07/31/2010)   Influenza vaccine due: 08/31/2010    Tetanus booster: Not documented   Tetanus booster due: 07/31/2020    Pneumococcal vaccine: Not documented  Other Screening  Reports requested:   Last colonoscopy report requested.  Smoking status: current  (07/31/2010)   Smoking cessation counseling: yes  (07/31/2010)  Lipids   Total Cholesterol: 217  (07/06/2008)   LDL: 116  (07/06/2008)   LDL Direct: Not documented   HDL: 38  (07/06/2008)   Triglycerides: 317  (07/06/2008)    SGOT (AST): 18  (09/27/2008)   SGPT (ALT): 33  (09/27/2008)   Alkaline phosphatase: 82  (09/27/2008)   Total bilirubin: 0.4  (09/27/2008)    Lipid flowsheet reviewed?: Yes   Progress toward LDL goal: Unchanged    Stage of readiness to change (lipid management): Preparation  Hypertension   Last Blood Pressure: 153 / 92  (07/31/2010)   Serum creatinine: 1.00  (09/27/2008)   Serum potassium 4.0  (09/27/2008)    Hypertension flowsheet reviewed?: Yes   Progress toward BP goal: Deteriorated    Stage of readiness to change (hypertension management): Action  Self-Management Support :   Personal Goals (by the next clinic visit) :      Personal blood pressure goal: 140/90  (07/31/2010)     Personal LDL goal: 100  (07/31/2010)    Patient will work on the following items until the next clinic visit to reach self-care goals:     Medications and monitoring: take my medicines every day  (07/31/2010)     Eating: drink diet soda or water instead of juice or soda, eat more vegetables, use fresh or frozen vegetables, eat baked foods instead of fried foods, limit or avoid alcohol  (07/31/2010)    Hypertension self-management support: Resources for patients  handout  (07/31/2010)    Lipid self-management support: Resources for patients handout  (07/31/2010)     Self-management comments: UNABLE TO EXERCISE DUE  TO PAIN IN LEGS AND FEET      Resource handout printed.   Nursing Instructions: Give tetanus booster today Request report of last colonoscopy    Process Orders Check Orders Results:     [facility]: ABN not required for this insurance Tests Sent for requisitioning (August 02, 2010 3:26 PM):     08/01/2010: [facility] -- T-Lipid Profile [phone] (signed)     08/01/2010: [facility] -- T-CBC No Diff [[id number]] (signed)     08/01/2010: [facility] -- T-TSH [phone] (signed)     07/31/2010: [facility] -- T-Drug Screen-Urine, (single) [[id number]] (signed)     07/31/2010: [facility] -- T-Urine Microalbumin w/creat. ratio [[id number]] (signed)     07/31/2010: [facility] -- T-Urinalysis [[id number]] (signed)     07/31/2010: [facility] -- T-CMP with Estimated GFR [[id number]] (signed)

## 2011-01-30 NOTE — Progress Notes (Signed)
  Phone Note Outgoing Call   Call placed by: Theotis Barrio NT II,  August 09, 2010 3:18 PM Call placed to: Patient Details for Reason: SPORTS MEDICINE V/ OPTH. REFERRAL Summary of Call: SPOKE WITH MR Marrin, HE HAS NOT YET TALKED WTIH DEBORAH HILL. TO PUT REFERRALS ON HOLD UNTIL HE IS ABLE TO TALKE WITH HER.

## 2011-01-30 NOTE — Progress Notes (Signed)
Summary: call for medicine/ hla  Phone Note Call from Patient   Summary of Call: pt calls very angry, requesting his lisinopril be filled now, he is informed that the physician has denied filling any medications until he is seen in clinic. he starts to raise his voice very loudly and states "so, you people don't care if i keel over before that appt cause you wouldn't give me my medicine", i answered sir, you could keel over by taking medicine without recently being evaluated, dr Phillips Odor was present at this time. pt then hung up on me after saying," i will just go to the street and buy something to lower my blood pressure" Initial call taken by: Marin Roberts RN,  July 21, 2010 12:43 PM  Follow-up for Phone Call        Provider Notified Follow-up by: Deatra Robinson MD,  July 24, 2010 11:51 PM

## 2011-01-30 NOTE — Progress Notes (Signed)
Summary: rtc/ hla  Phone Note Call from Patient   Summary of Call: pt called, left message to rtc, called, a lady stated he was not there and she would have him call clinic Initial call taken by: Marin Roberts RN,  September 18, 2010 5:28 PM

## 2011-04-21 ENCOUNTER — Other Ambulatory Visit: Payer: Self-pay | Admitting: Internal Medicine

## 2011-04-23 NOTE — Telephone Encounter (Signed)
Last visit in 8/11 with Dr Denton Meek  OK to refill X 3 but will need appointment in the next few months

## 2011-05-18 NOTE — H&P (Signed)
[name] Acosta, [name]                ACCOUNT NO.:  [id number]   MEDICAL RECORD NO.:  [id number]          PATIENT TYPE:  EMS   LOCATION:  ED                           FACILITY:  [facility]   PHYSICIAN:  Angelia Mould. Derrell Lolling, M.D.DATE OF BIRTH:  [birth year]   DATE OF ADMISSION:  02/27/2007  DATE OF DISCHARGE:                              HISTORY & PHYSICAL   CHIEF COMPLAINT:  Abdominal pain and vomiting.   HISTORY OF PRESENT ILLNESS:  This is a 51 year old white man who states  that he began having crampy diffuse abdominal pain at about noontime  yesterday, approximately 18-20 hours ago.  He has vomited 5 times,  mostly greenish fluid.  He states he had a loose bowel movement  yesterday after the pain started and then had a normal bowel movement  the previous day.   He denies any prior GI problems like this, but on the record, he has had  an upper GI and small-bowel follow-through in 2006 ordered by Dr. Ewing Schlein,  which was normal.  He has been followed by Dr. Vic Blackbird over  the years and he is on doxazosin.  CT scans have shown a small hypodense  lesion of the left kidney for 3 or 4 years that has been stable.   He was evaluated by Dr. Effie Shy, the emergency department physician.  Abdominal x-ray showed some slightly dilated, slightly thickened small  bowel loops of uncertain etiology.  A CT scan suggested a small-bowel  obstruction with a transition in the mid small bowel, no sign of  inflammatory process.  A small 5-mm hypodense lesion of the left kidney  was noted again.  He is being admitted for further evaluation and  management.   PAST HISTORY:  1. He states he has had a brain tumor removed by Dr. Jeral Fruit.  2. Right rotator cuff surgery.  3. Left ankle reconstruction.  4. Open appendectomy.  5. Bilateral inguinal hernia repair by Dr. Daphine Deutscher.  6. Hypertension.  7. Hyperlipidemia.   CURRENT MEDICATIONS:  1. Aspirin 81 mg a day.  2. Doxazosin, unknown dose, once a day.  3.  Lorazepam 1 mg three times a day.  4. Protonix 40 mg once a day.  5. Soma 350 mg three times a day.  6. Ziac 10 mg once a day.  7. Zetia one tablet daily.   DRUG ALLERGIES:  He tells me PENICILLIN and CODEINE.   SOCIAL HISTORY:  He lives in [place].  He is married.  He has 1 child and  1 stepchild.  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  He smokes 1 pack of cigarettes per day.  He used  to drink alcohol fairly heavily, but not lately; no drinking at all now.   FAMILY HISTORY:  Mother living, had history of breast cancer and has  mental problems according to him.  Father living and has hypertension.   REVIEW OF SYSTEMS:  Fifteen-system review of systems is performed and is  noncontributory, except as described above.   PHYSICAL EXAMINATION:  GENERAL:  A thin white man in  mild to moderate  distress.  He appears somewhat sedated by the Dilaudid that he has  received prior to my arrival.  He does answer questions appropriately  and is cooperative.  VITAL SIGNS:  Temperature 97.5, blood pressure 108/73, heart rate 88 and  regular, respirations 29 and unlabored, oxygen saturation 95% on room  air.  HEENT:  Eyes:  Sclerae are clear.  Extraocular movements intact.  Ears,  nose, mouth and throat, nose, lips, tongue and oropharynx are without  gross lesions.  NECK:  Supple and nontender.  No mass.  No jugular distention.  LUNGS:  Clear to auscultation.  No chest wall tenderness.  HEART:  Regular rate and rhythm.  No murmur.  Radial and femoral pulses  are palpable.  ABDOMEN:  Not obviously distended.  Active bowel sounds.  Fairly soft,  but with mild diffuse tenderness.  I cannot get any evidence of any  rebound.  There is no percussion tenderness anywhere.  I do feel a mass.  I do not feel a hernia.  There is a transverse scar in the right lower  quadrant.  There are bilateral inguinal scars.  I do not feel an  inguinal hernia or any inguinal mass  or tenderness.  EXTREMITIES:  Moves all 4 extremities well without pain or deformity.  NEUROLOGIC:  No gross motor or sensory deficits.   ADMISSION DATA:  1. CT scan suggesting mid small bowel obstruction as described above.  2. Hemoglobin 14.6, white blood cell count 9500; differential is      normal with 72 neutrophils, 20 lymphocytes and 6 monocytes and 2      eosinophils.  Basic metabolic panel shows a sodium of 135,      potassium 3.4, chloride 97, carbon dioxide 29, glucose 108, BUN 12,      creatinine 0.86, calcium 8.8.  Liver function tests are basically      normal except for an SGOT of 59; lipase is 51.  Urinalysis is      negative.   ASSESSMENT:  1. Abdominal pain and vomiting.  Most likely diagnosis is a small-      bowel obstruction secondary to adhesions.  There is no sign of any      compromised bowel at this time, although he is still symptomatic      despite the nasogastric tube.  2. History of hypertension.  3. History of appendectomy.  4. History of bilateral inguinal hernia repair.   PLAN:  1. The patient will be admitted for bowel rest, nasogastric suction      and we will reevaluate him in 12-24 hours with followup lab and x-      ray.  He has been told that if this does not resolve spontaneously,      he may be a laparotomy.      Angelia Mould. Derrell Lolling, M.D.  Electronically Signed     HMI/MEDQ  D:  02/27/2007  T:  02/27/2007  Job:  [id number]   cc:   [facility]

## 2011-05-18 NOTE — Op Note (Signed)
NAMEJACEN, [name] Acosta                ACCOUNT NO.:  [id number]   MEDICAL RECORD NO.:  [id number]          PATIENT TYPE:  AMB   LOCATION:  NESC                         FACILITY:  [facility]   PHYSICIAN:  Thornton Park. Daphine Deutscher, MD  DATE OF BIRTH:  May 23, 1960   DATE OF PROCEDURE:  08/27/2005  DATE OF DISCHARGE:                                 OPERATIVE REPORT   PREOPERATIVE DIAGNOSIS:  Bilateral inguinal hernias.   POSTOPERATIVE DIAGNOSIS:  Bilateral direct inguinal hernias, left larger  than right, the right incarcerated and pinching hence probably causing pain.   SURGEON:  Thornton Park. Daphine Deutscher, MD   ANESTHESIA:  General endotracheal.   DESCRIPTION OF PROCEDURE:  [name] Acosta is a 51 year old man with a lot of  pain on the right side and bilateral inguinal hernias. He was taken to OR 1  at [facility] and after general anesthesia was administered and a  Foley was placed, I made a transverse incision, dissected to the left  midline, inserted a balloon and got good preperitoneal dissection. He has a  small pelvis and has had a previous appendectomy and that did limit the size  of my cavity for operating. I dissected free two hernias, one on the left  that was incarcerated and was a fairly broad direct hernia. On the right,  there was a prominent right inguinal hernia but a little more narrow and a  little more incarcerated. Both were freed. I cleaned everything up as best I  could particularly because of the inflammatory change on the right side from  his previous appendectomy but was able to identify the cord structures.  There did not appear to be __________ indirect hernias. I then placed a  piece of left large mesh tacking it a little more to the right of the  midline but I carried it out laterally. Everywhere I placed a tack, I could  palpate the tacker and it seemed to be well anterior to the anterior  superior iliac spine. I placed a single tacked laterally and tacked it along  the  iliopubic tract and then anteriorly. I covered the defect. I similarly  inserted a right large 3-D max mesh again letting them overlap and tacked it  and got good coverage of both direct defects. There was essentially minimal  bleeding noted. I then deflated the preperitoneal space. The two 5 mm  trocars that I had placed under vision using needle guide were then  withdrawn and the balloon tip trocar was removed. The fascia and the upper  midline 10 mm port was closed with a single #0 Vicryl and then 4-0 Vicryl  used to close the skin with Benzoin and Steri-Strips. The patient seemed to  tolerate the procedure well and was taken to the recovery room in  satisfactory condition. He will be given Tylox to take as needed for pain  and will be followed-up in the office in about 3 weeks.      Thornton Park Daphine Deutscher, MD  Electronically Signed     MBM/MEDQ  D:  08/27/2005  T:  08/27/2005  Job:  [id number] 

## 2011-05-18 NOTE — Discharge Summary (Signed)
NAMEPARVIN, STETZER                ACCOUNT NO.:  [id number]   MEDICAL RECORD NO.:  [id number]          PATIENT TYPE:  INP   LOCATION:  1618                         FACILITY:  [facility]   PHYSICIAN:  Angelia Mould. Derrell Lolling, M.D.DATE OF BIRTH:  January 11, 1960   DATE OF ADMISSION:  02/26/2007  DATE OF DISCHARGE:  03/02/2007                               DISCHARGE SUMMARY   FINAL DIAGNOSES:  1. Partial small-bowel obstruction, resolved.  2. Hypertension.  3. History of appendectomy.  4. History of bilateral inguinal hernia repairs.   OPERATIONS PERFORMED:  None.   HISTORY:  This is a 51 year old white man who reported crampy abdominal  pain for 24 hours.  He reported multiple episodes of bilious vomiting,  but he stated he had a loose bowel movement 1 day prior to admission.  He denies having prior GI problems, but on the hospital records, there  was a report of an upper GI small-bowel follow-through in 2006 by Dr.  Vida Rigger which was normal.   When he came to the emergency room, he was evaluated by Dr. Shelva Majestic, the  emergency department physician, and x-rays showed some slightly dilated,  slightly thickened small-bowel loops of uncertain etiology.  The CT scan  suggested a small-bowel obstruction with transition in mid small bowel  and a 5mm hypodense lesion in the left kidney which has been noted for  years and followed by Dr. Vic Blackbird.   PHYSICAL EXAMINATION:  GENERAL:  He was found to be a thin man in mild  to moderate distress.  ABDOMEN:  Was not obviously distended, had active bowel sounds.  It was  soft with mild diffuse tenderness, no rebound, no mass, no hernia.  Transverse scar in the right lower quadrant and bilateral inguinal  scars. No evidence of any recurrent hernia.   ADMISSION DATA:  Hemoglobin 14.6, white blood cell count 9500.  Chemistry studies were basically normal. Lipase 51.  Urinalysis clear   HOSPITAL COURSE:  A nasogastric tube was placed.  IV fluids were  started, and the patient was admitted.  Over the next couple days he  improved.  He did have some nausea and mild pain initially, but he got  more comfortable by  February 29 and said he passed a little bit of flatus.  His x-rays  improved showing a normal gas pattern, and his nasogastric tube was  removed.  He was discharged on March 02, 2007.  At that time, he was  tolerating oral diet and was asymptomatic.  He was given a regular diet  and allowed to go home.  He is to follow up with [place] at as needed.      Angelia Mould. Derrell Lolling, M.D.  Electronically Signed     HMI/MEDQ  D:  03/23/2007  T:  03/23/2007  Job:  [id number]   cc:   [facility]

## 2011-08-01 ENCOUNTER — Encounter: Payer: Self-pay | Admitting: Internal Medicine

## 2011-08-01 ENCOUNTER — Ambulatory Visit (INDEPENDENT_AMBULATORY_CARE_PROVIDER_SITE_OTHER): Payer: Self-pay | Admitting: Internal Medicine

## 2011-08-01 VITALS — BP 136/85 | HR 90 | Temp 97.1°F | Ht 69.0 in | Wt 215.3 lb

## 2011-08-01 DIAGNOSIS — R319 Hematuria, unspecified: Secondary | ICD-10-CM

## 2011-08-01 DIAGNOSIS — M25579 Pain in unspecified ankle and joints of unspecified foot: Secondary | ICD-10-CM

## 2011-08-01 DIAGNOSIS — M25572 Pain in left ankle and joints of left foot: Secondary | ICD-10-CM | POA: Insufficient documentation

## 2011-08-01 DIAGNOSIS — G8929 Other chronic pain: Secondary | ICD-10-CM

## 2011-08-01 DIAGNOSIS — I10 Essential (primary) hypertension: Secondary | ICD-10-CM

## 2011-08-01 DIAGNOSIS — Z0000 Encounter for general adult medical examination without abnormal findings: Secondary | ICD-10-CM | POA: Insufficient documentation

## 2011-08-01 HISTORY — DX: Other chronic pain: G89.29

## 2011-08-01 HISTORY — DX: Hematuria, unspecified: R31.9

## 2011-08-01 LAB — LIPID PANEL
Cholesterol: 180 mg/dL (ref 0–200)
Total CHOL/HDL Ratio: 5.1 Ratio
VLDL: 37 mg/dL (ref 0–40)

## 2011-08-01 LAB — CBC
Hemoglobin: 14.4 g/dL (ref 13.0–17.0)
MCH: 31.6 pg (ref 26.0–34.0)
MCHC: 33.1 g/dL (ref 30.0–36.0)
MCV: 95.4 fL (ref 78.0–100.0)
Platelets: 194 10*3/uL (ref 150–400)
RBC: 4.56 MIL/uL (ref 4.22–5.81)

## 2011-08-01 LAB — POCT URINALYSIS DIPSTICK
Glucose, UA: NEGATIVE
Leukocytes, UA: NEGATIVE
Nitrite, UA: NEGATIVE
Urobilinogen, UA: 1

## 2011-08-01 MED ORDER — TRAMADOL HCL 50 MG PO TABS: 50.0000 mg | ORAL_TABLET | Freq: Four times a day (QID) | ORAL | Status: DC | PRN

## 2011-08-01 NOTE — Assessment & Plan Note (Signed)
He reports worsening of his chronic ankle pain. He was offered some physical therapy which he refused. We'll treat his pain symptomatically with tramadol. It was noted that he has allergies to codeine and therefore he was informed to call the clinic if he has similar kind of reaction or some new rash from that medication. He was also informed to stop that medication at that time. Patient voices clear understanding.

## 2011-08-01 NOTE — Assessment & Plan Note (Signed)
Blood pressure well controlled.  Will continue the current regimen. Lab Results  Component Value Date   NA 141 09/19/2010   K 4.2 09/19/2010   CL 106 09/19/2010   CO2 27 09/19/2010   BUN 14 09/19/2010   CREATININE 0.92 09/19/2010    BP Readings from Last 3 Encounters:  08/01/11 136/85  07/31/10 153/92  10/19/08 130/82

## 2011-08-01 NOTE — Patient Instructions (Signed)
Please take your medicines as prescribed. Please call the clinic and let us know if you experience some side effects from new medicine. If you have severe reaction chest pain, SOB  then call [phone] immediately.

## 2011-08-01 NOTE — Assessment & Plan Note (Addendum)
Differentials include renal calculus versus renal cyst  Versus BPH. Given his history of renal calculus and hemorrhagic cyst ( seen on the CT scan in 2006), which is high on differential. Will get UA , CBC and BMET for him today. We will also try to get his old records from Dr. Serena Colonel at the [facility]. Patient does not have any insurance currently and does not even have orange card, so urology referral would be difficult at this time. We'll refer him to [facility] and once he gets his orange card, will get a urology referral.  His notes from [facility] were reviewed- his last cystoscopy was in June 2006 which was negative. He had lithotripsy last in 2002 and had not any stones since then.

## 2011-08-01 NOTE — Progress Notes (Signed)
  Subjective:    Patient ID: [name] Acosta, male    DOB: 1960-10-05, 51 y.o.   MRN: [id number]  HPI: 51 year old man with past medical history significant for an ankle surgery in 1999, hypertension, history of renal calculus comes to the clinic for followup visit.  He reports he had an episode of hematuria about 3 days ago which resolved by itself. He denies any associated abdominal pain, urgency , frequency , fevers, nausea or vomiting. He states he follows up with Dr. Aldean Ast for some renal cysts and thinks that it could be related to that. He also reports some fullness in his ears that started around 2 weeks ago but denies any productive cough, postnasal drip, headaches. He reports some occasional low- pitched noise in his ears and dizziness whenever he stands up but denies any complaints today. He also reports some worsening of his chronic ankle pain for last couple of weeks.    Review of Systems  Constitutional: Negative for fever, chills, activity change, appetite change and fatigue.  HENT: Negative for ear pain, tinnitus and ear discharge.   Cardiovascular: Negative for chest pain, palpitations and leg swelling.  Gastrointestinal: Negative for abdominal distention.  Genitourinary: Positive for hematuria. Negative for dysuria, frequency, flank pain, enuresis and difficulty urinating.  Musculoskeletal: Negative for arthralgias.  Neurological: Negative for dizziness, facial asymmetry, weakness and headaches.       Objective:   Physical Exam  Constitutional: He is oriented to person, place, and time. He appears well-developed and well-nourished.  HENT:  Head: Normocephalic and atraumatic.  Right Ear: External ear normal.  Left Ear: External ear normal.  Eyes: Conjunctivae and EOM are normal. Pupils are equal, round, and reactive to light. Right eye exhibits no discharge. Left eye exhibits no discharge.       Bilateral TM intact.  Neck: Normal range of motion. Neck supple. No JVD  present. No tracheal deviation present. No thyromegaly present.  Cardiovascular: Normal rate, regular rhythm, normal heart sounds and intact distal pulses.  Exam reveals no gallop and no friction rub.   No murmur heard. Pulmonary/Chest: Effort normal and breath sounds normal. No stridor. No respiratory distress. He has no wheezes. He has no rales. He exhibits no tenderness.  Abdominal: Soft. Bowel sounds are normal. He exhibits no distension and no mass. There is no tenderness. There is no rebound and no guarding.  Musculoskeletal: Normal range of motion. He exhibits no edema and no tenderness.  Lymphadenopathy:    He has no cervical adenopathy.  Neurological: He is alert and oriented to person, place, and time. He displays normal reflexes. No cranial nerve deficit. He exhibits normal muscle tone. Coordination normal.  Skin: Skin is warm.          Assessment & Plan:

## 2011-08-01 NOTE — Assessment & Plan Note (Signed)
Will check his lipid profile today.

## 2011-08-02 ENCOUNTER — Other Ambulatory Visit: Payer: Self-pay | Admitting: Internal Medicine

## 2011-08-02 LAB — COMPLETE METABOLIC PANEL WITH GFR
ALT: 39 U/L (ref 0–53)
AST: 31 U/L (ref 0–37)
CO2: 27 mEq/L (ref 19–32)
Calcium: 9.2 mg/dL (ref 8.4–10.5)
Chloride: 106 mEq/L (ref 96–112)
Creat: 0.82 mg/dL (ref 0.50–1.35)
GFR, Est African American: >60 mL/min (ref >60)
Sodium: 142 mEq/L (ref 135–145)
Total Protein: 6.5 g/dL (ref 6.0–8.3)

## 2011-08-23 ENCOUNTER — Ambulatory Visit (INDEPENDENT_AMBULATORY_CARE_PROVIDER_SITE_OTHER): Payer: Self-pay | Admitting: Internal Medicine

## 2011-08-23 VITALS — BP 143/96 | HR 88 | Temp 97.5°F | Ht 69.0 in | Wt 214.9 lb

## 2011-08-23 DIAGNOSIS — Z0000 Encounter for general adult medical examination without abnormal findings: Secondary | ICD-10-CM

## 2011-08-23 DIAGNOSIS — R319 Hematuria, unspecified: Secondary | ICD-10-CM

## 2011-08-23 DIAGNOSIS — Z23 Encounter for immunization: Secondary | ICD-10-CM

## 2011-08-23 LAB — URINALYSIS, ROUTINE W REFLEX MICROSCOPIC
Glucose, UA: NEGATIVE mg/dL
Leukocytes, UA: NEGATIVE
Nitrite: NEGATIVE
pH: 6 (ref 5.0–8.0)

## 2011-08-23 NOTE — Progress Notes (Signed)
  Subjective:    Patient ID: [name] Acosta, male    DOB: 02-04-1960, 51 y.o.   MRN: [id number]  HPI: 51 year old man with past medical history significant for hypertension, hyperlipidemia, renal calculus comes to the clinic for a followup visit.  Patient was recently seen in the clinic about 3 weeks ago for hematuria. He denies any further episodes. He occasionally has abdominal pain and diarrhea. He takes Imodium every day which helps him with his diarrhea.   Denies any symptoms including hesitancy, frequency, urgency, fever, chills nausea or vomiting.         Review of Systems  Constitutional: Negative for fever, chills and diaphoresis.  HENT: Negative for nosebleeds, congestion, sore throat, rhinorrhea, dental problem and postnasal drip.   Eyes: Negative for visual disturbance.  Respiratory: Negative for apnea, cough, choking, chest tightness, shortness of breath, wheezing and stridor.   Cardiovascular: Negative for chest pain, palpitations and leg swelling.  Gastrointestinal: Negative for nausea, abdominal pain and constipation.  Genitourinary: Negative for dysuria, urgency, hematuria and flank pain.  Musculoskeletal: Positive for arthralgias.  Neurological: Negative for dizziness.  Hematological: Negative for adenopathy.       Objective:   Physical Exam  Constitutional: He is oriented to person, place, and time. He appears well-developed and well-nourished. No distress.  HENT:  Head: Normocephalic.  Eyes: Conjunctivae and EOM are normal. Pupils are equal, round, and reactive to light.  Neck: Normal range of motion. Neck supple. No JVD present. No tracheal deviation present. No thyromegaly present.  Cardiovascular: Normal rate, regular rhythm and intact distal pulses.  Exam reveals no gallop and no friction rub.   No murmur heard. Pulmonary/Chest: Effort normal and breath sounds normal. No stridor. No respiratory distress. He has no wheezes. He has no rales. He exhibits no  tenderness.  Abdominal: Soft. Bowel sounds are normal. He exhibits no distension. There is no tenderness. There is no rebound and no guarding.  Musculoskeletal: Normal range of motion. He exhibits no edema and no tenderness.  Lymphadenopathy:    He has no cervical adenopathy.  Neurological: He is alert and oriented to person, place, and time. He has normal reflexes. He displays normal reflexes. No cranial nerve deficit. He exhibits normal muscle tone. Coordination normal.  Skin: Skin is warm. He is not diaphoretic.          Assessment & Plan:

## 2011-08-23 NOTE — Assessment & Plan Note (Signed)
Followup on hematuria-no new episodes. Denies any complaints including dysuria, hesitancy , urgency or frequency. After discussing with Dr. Phillips Odor, -Recheck UA. -Will get a PSA and urine cytology. -Has not seen Rudell Cobb since last clinic appointment ( with no insurance and orange card- urology referral could not be placed at this visit as well) -If he has new episodes of hematuria -he was advised to call the clinic- we may consider getting a CT scan at that time.  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 - a small 0.3 mm calculus in left kidney - Urine cytology in 2009 was negative.

## 2011-08-23 NOTE — Assessment & Plan Note (Signed)
He got a flu shot and Tdap. States that he had colonoscopy 3-4 years ago. Will try to obtain those records.

## 2011-08-24 ENCOUNTER — Other Ambulatory Visit (HOSPITAL_COMMUNITY)
Admission: RE | Admit: 2011-08-24 | Discharge: 2011-08-24 | Disposition: A | Discharge status: Home / Self Care | Payer: Self-pay | Source: Ambulatory Visit | Attending: Internal Medicine | Admitting: Internal Medicine

## 2011-08-24 DIAGNOSIS — Z23 Encounter for immunization: Secondary | ICD-10-CM

## 2011-08-24 DIAGNOSIS — R319 Hematuria, unspecified: Secondary | ICD-10-CM | POA: Insufficient documentation

## 2011-08-24 DIAGNOSIS — Z0000 Encounter for general adult medical examination without abnormal findings: Secondary | ICD-10-CM

## 2011-08-24 LAB — URINALYSIS, MICROSCOPIC ONLY
Bacteria, UA: NONE SEEN
Casts: NONE SEEN
Crystals: NONE SEEN

## 2011-08-24 LAB — PSA: PSA: 0.4 ng/mL (ref <=4.00)

## 2011-08-24 NOTE — Patient Instructions (Signed)
Please schedule a follow up appointment in 1 month. Please take your medicines as prescribed. 

## 2011-12-10 ENCOUNTER — Other Ambulatory Visit: Payer: Self-pay | Admitting: *Deleted

## 2011-12-10 DIAGNOSIS — I10 Essential (primary) hypertension: Secondary | ICD-10-CM

## 2011-12-10 MED ORDER — LISINOPRIL 5 MG PO TABS: 5.0000 mg | ORAL_TABLET | Freq: Every day | ORAL | Status: DC

## 2012-02-01 ENCOUNTER — Encounter: Payer: Self-pay | Admitting: Internal Medicine

## 2012-02-01 ENCOUNTER — Ambulatory Visit (INDEPENDENT_AMBULATORY_CARE_PROVIDER_SITE_OTHER): Payer: Self-pay | Admitting: Internal Medicine

## 2012-02-01 DIAGNOSIS — E785 Hyperlipidemia, unspecified: Secondary | ICD-10-CM

## 2012-02-01 DIAGNOSIS — F17200 Nicotine dependence, unspecified, uncomplicated: Secondary | ICD-10-CM

## 2012-02-01 DIAGNOSIS — M25519 Pain in unspecified shoulder: Secondary | ICD-10-CM

## 2012-02-01 DIAGNOSIS — I10 Essential (primary) hypertension: Secondary | ICD-10-CM

## 2012-02-01 MED ORDER — LISINOPRIL 5 MG PO TABS: 10.0000 mg | ORAL_TABLET | Freq: Every day | ORAL | Status: DC

## 2012-02-01 MED ORDER — PRAVASTATIN SODIUM 10 MG PO TABS: 10.0000 mg | ORAL_TABLET | Freq: Every day | ORAL | Status: DC

## 2012-02-01 NOTE — Patient Instructions (Signed)
Please schedule a follow up appointment in 3 months or earlier if needed. Please take your medicines as prescribed. Please contact our financial advisor - Debra hill for orange card.

## 2012-02-01 NOTE — Assessment & Plan Note (Signed)
Counseled on tobacco cessation. Various aids were discussed.

## 2012-02-01 NOTE — Assessment & Plan Note (Signed)
His shoulder pain is most likely thought to be related to degenerative arthritis. -Advised to use heat compresses. -He was advised to use NSAIDS on his bad days. - Refer  him for physical therapy

## 2012-02-01 NOTE — Assessment & Plan Note (Signed)
Lab Results  Component Value Date   NA 142 08/01/2011   K 3.6 08/01/2011   CL 106 08/01/2011   CO2 27 08/01/2011   BUN 19 08/01/2011   CREATININE 0.82 08/01/2011   CREATININE 0.92 09/19/2010    BP Readings from Last 3 Encounters:  02/01/12 144/92  08/23/11 143/96  08/01/11 136/85    Assessment: Hypertension control:  mildly elevated  Progress toward goals:  deteriorated Barriers to meeting goals:  no barriers identified  Plan: Hypertension treatment:  Will increase the dose of his lisinopril.

## 2012-02-01 NOTE — Progress Notes (Signed)
  Subjective:    Patient ID: [name] Acosta, male    DOB: Sep 04, 1960, 52 y.o.   MRN: [id number]  HPI: 52 year old man with medical history significant for hypertension, hyperlipidemia comes to the clinic for a followup visit.  He states that he has been having increased pain in his left shoulder for last few months. He denies any associated tingling and numbness.  He also reports having increased pain in his hands and knee causing him some difficulty with some daily activities.  He continues to smoke 1-1/2 pack everyday.    Review of Systems  Constitutional: Negative for fever, appetite change, fatigue and unexpected weight change.  HENT: Negative for nosebleeds, congestion, rhinorrhea, sneezing, neck pain and postnasal drip.   Eyes: Negative for visual disturbance.  Respiratory: Negative for apnea, cough, choking, chest tightness and shortness of breath.   Gastrointestinal: Negative for abdominal pain.  Genitourinary: Negative for dysuria and flank pain.  Musculoskeletal: Negative for arthralgias.  Neurological: Negative for dizziness, facial asymmetry, light-headedness, numbness and headaches.  Hematological: Negative for adenopathy.  Psychiatric/Behavioral: Negative for agitation.       Objective:   Physical Exam  Constitutional: He is oriented to person, place, and time. He appears well-developed and well-nourished. No distress.  HENT:  Head: Normocephalic and atraumatic.  Mouth/Throat: No oropharyngeal exudate.  Eyes: Conjunctivae and EOM are normal. Pupils are equal, round, and reactive to light. No scleral icterus.  Neck: Normal range of motion. Neck supple. No JVD present. No tracheal deviation present. No thyromegaly present.  Cardiovascular: Normal rate, regular rhythm, normal heart sounds and intact distal pulses.  Exam reveals no gallop and no friction rub.   No murmur heard. Pulmonary/Chest: Effort normal and breath sounds normal. No stridor. No respiratory distress.  He has no wheezes. He has no rales. He exhibits no tenderness.  Abdominal: Soft. Bowel sounds are normal. He exhibits no distension and no mass. There is no tenderness. There is no rebound and no guarding.  Musculoskeletal: He exhibits no edema and no tenderness.       Left shoulder tender to palpation, restricted ROM.  Lymphadenopathy:    He has no cervical adenopathy.  Neurological: He is alert and oriented to person, place, and time. He has normal reflexes. He displays normal reflexes. No cranial nerve deficit. He exhibits normal muscle tone. Coordination normal.  Skin: Skin is warm. He is not diaphoretic.          Assessment & Plan:

## 2012-02-07 ENCOUNTER — Other Ambulatory Visit: Payer: Self-pay | Admitting: Internal Medicine

## 2012-02-12 ENCOUNTER — Other Ambulatory Visit: Payer: Self-pay | Admitting: Internal Medicine

## 2012-02-12 DIAGNOSIS — I10 Essential (primary) hypertension: Secondary | ICD-10-CM

## 2012-02-12 MED ORDER — LISINOPRIL 10 MG PO TABS: 10.0000 mg | ORAL_TABLET | Freq: Every day | ORAL | Status: DC

## 2012-06-02 ENCOUNTER — Other Ambulatory Visit: Payer: Self-pay | Admitting: *Deleted

## 2012-06-02 DIAGNOSIS — N400 Enlarged prostate without lower urinary tract symptoms: Secondary | ICD-10-CM

## 2012-06-02 MED ORDER — DOXAZOSIN MESYLATE 4 MG PO TABS: 4.0000 mg | ORAL_TABLET | Freq: Two times a day (BID) | ORAL | Status: DC

## 2012-06-02 NOTE — Telephone Encounter (Signed)
ERROR

## 2012-06-02 NOTE — Telephone Encounter (Signed)
Cardura rx refilled - request form faxed to [facility] .

## 2012-06-02 NOTE — Telephone Encounter (Signed)
Requesting 90-day supply per fax from [facility].

## 2012-08-23 ENCOUNTER — Other Ambulatory Visit: Payer: Self-pay | Admitting: Internal Medicine

## 2012-08-23 DIAGNOSIS — E785 Hyperlipidemia, unspecified: Secondary | ICD-10-CM

## 2012-09-30 ENCOUNTER — Other Ambulatory Visit: Payer: Self-pay | Admitting: Internal Medicine

## 2012-11-27 ENCOUNTER — Other Ambulatory Visit: Payer: Self-pay | Admitting: Internal Medicine

## 2012-12-01 ENCOUNTER — Encounter: Payer: Self-pay | Admitting: Internal Medicine

## 2012-12-01 ENCOUNTER — Other Ambulatory Visit: Payer: Self-pay | Admitting: Internal Medicine

## 2012-12-01 ENCOUNTER — Ambulatory Visit (INDEPENDENT_AMBULATORY_CARE_PROVIDER_SITE_OTHER): Payer: Self-pay | Admitting: Internal Medicine

## 2012-12-01 VITALS — BP 137/92 | HR 103 | Temp 97.7°F | Ht 69.0 in | Wt 234.0 lb

## 2012-12-01 DIAGNOSIS — R319 Hematuria, unspecified: Secondary | ICD-10-CM

## 2012-12-01 DIAGNOSIS — R635 Abnormal weight gain: Secondary | ICD-10-CM

## 2012-12-01 DIAGNOSIS — M25579 Pain in unspecified ankle and joints of unspecified foot: Secondary | ICD-10-CM

## 2012-12-01 DIAGNOSIS — M25572 Pain in left ankle and joints of left foot: Secondary | ICD-10-CM

## 2012-12-01 DIAGNOSIS — J309 Allergic rhinitis, unspecified: Secondary | ICD-10-CM | POA: Insufficient documentation

## 2012-12-01 DIAGNOSIS — Z23 Encounter for immunization: Secondary | ICD-10-CM

## 2012-12-01 DIAGNOSIS — F17200 Nicotine dependence, unspecified, uncomplicated: Secondary | ICD-10-CM

## 2012-12-01 DIAGNOSIS — I10 Essential (primary) hypertension: Secondary | ICD-10-CM

## 2012-12-01 DIAGNOSIS — E785 Hyperlipidemia, unspecified: Secondary | ICD-10-CM

## 2012-12-01 DIAGNOSIS — Z0000 Encounter for general adult medical examination without abnormal findings: Secondary | ICD-10-CM

## 2012-12-01 DIAGNOSIS — J329 Chronic sinusitis, unspecified: Secondary | ICD-10-CM

## 2012-12-01 MED ORDER — LISINOPRIL 10 MG PO TABS: 10.0000 mg | ORAL_TABLET | Freq: Every day | ORAL | Status: DC

## 2012-12-01 MED ORDER — MOMETASONE FUROATE 50 MCG/ACT NA SUSP: 2.0000 | Freq: Every day | NASAL | Status: DC

## 2012-12-01 NOTE — Assessment & Plan Note (Signed)
Check lipid panel and CMP. Await results to make any further changes in the regimen.

## 2012-12-01 NOTE — Telephone Encounter (Signed)
Betty at [facility], [place], confirms they have already received the Cardura 4 mg, 1 BID ,#180 with 1 refill Rx - Dorie Rank, RN, 12/01/2012, 5:05P

## 2012-12-01 NOTE — Patient Instructions (Addendum)
General Instructions:  Please schedule a follow up appointment in 3 months or sooner if needed . Please bring your medication bottles with your next appointment. Please take your medicines as prescribed. I will call you with your lab results if anything will be abnormal.

## 2012-12-01 NOTE — Assessment & Plan Note (Signed)
Blood pressure mildly elevated. Continue current regimen for now.  BP Readings from Last 3 Encounters:  12/01/12 137/92  02/01/12 144/92  08/23/11 143/96

## 2012-12-01 NOTE — Assessment & Plan Note (Signed)
He was counseled on tobacco cessation. He is planning to try electronic cigarettes but is not ready to quit completely. He was given the number for 1-800- QUIT- NOW. Continue to counsel him at future visits.

## 2012-12-01 NOTE — Assessment & Plan Note (Signed)
Continues to complain of worsening chronic left ankle pain- that usually flares up with seasonal changes.  - Advised to use heat or ice - Agree with Tylenol or Ibuprofen for PRN worse pain

## 2012-12-01 NOTE — Assessment & Plan Note (Signed)
He reports increased trouble with rhinorrhea, postnasal drip and sneezing recently. On exam he was found to have some maxillary tenderness. -Advised steam inhalation and warm saline gargles - Nasonex spray for congestion

## 2012-12-01 NOTE — Progress Notes (Signed)
Subjective:   Patient ID: [name] Acosta male   DOB: 23-Jan-1960 52 y.o.   MRN: [id number]  HPI: 52 year old man with past medical history significant for hypertension, hyperlipidemia, craniopharyngioma status post resection in 1988 comes to the clinic for a followup visit.  Patient reports having reconstruction surgery in his left ankle in 1999 and has been having problems with left ankle pain ever since then. He describes his pain as sharp or penetrating pain, on his worse days he rates his pain 8/10 with today being one of them. Denies any swelling or redness associated with it. States that Tylenol or ibuprofen helps a little bit but tramadol doesn't  He also reports having problems with his sinuses not since his surgery for craniopharyngioma. His symptoms include postnasal drip, sneezing and rhinorrhea.  Denies having any urinary complaints or having any further episodes of hematuria.   He has gained 24 lbs since his last visit in 02/13.   History   Social History  . Marital Status: Legally Separated    Spouse Name: N/A    Number of Children: N/A  . Years of Education: N/A   Occupational History  . Not on file.   Social History Main Topics  . Smoking status: Current Every Day Smoker -- 1.0 packs/day    Types: Cigarettes  . Smokeless tobacco: Not on file  . Alcohol Use: Not on file  . Drug Use: Not on file  . Sexually Active: Not on file   Other Topics Concern  . Not on file   Social History Narrative  . No narrative on file   Review of Systems: General: Denies fever, chills, diaphoresis, appetite change and fatigue. HEENT: Denies photophobia, eye pain, redness, hearing loss, ear pain, congestion, sore throat, , mouth sores, trouble swallowing, neck pain, neck stiffness and tinnitus.+ postnasal drip, + sneezing, + rhinorrhea  Respiratory: Denies SOB, DOE, cough, chest tightness, and wheezing. Cardiovascular: Denies to chest pain, palpitations and leg  swelling. Gastrointestinal: Denies nausea, vomiting, abdominal pain, diarrhea, constipation, blood in stool and abdominal distention. Genitourinary: Denies dysuria, urgency, frequency, hematuria, flank pain and difficulty urinating. Musculoskeletal: Denies myalgias, back pain, joint swelling, arthralgias and gait problem.  Skin: Denies pallor, rash and wound. Neurological: Denies dizziness, seizures, syncope, weakness, light-headedness, numbness and headaches. Hematological: Denies adenopathy, easy bruising, personal or family bleeding history. Psychiatric/Behavioral: Denies suicidal ideation, mood changes, confusion, nervousness, sleep disturbance and agitation.    Current Outpatient Medications: Current Outpatient Prescriptions  Medication Sig Dispense Refill  . doxazosin (CARDURA) 4 MG tablet TAKE 1 TABLET BY MOUTH TWICE A DAY  180 tablet  1  . lisinopril (PRINIVIL,ZESTRIL) 10 MG tablet TAKE 1 TABLET BY MOUTH EVERY DAY  30 tablet  3  . pravastatin (PRAVACHOL) 10 MG tablet Take 1 tablet (10 mg total) by mouth daily.  62 tablet  4  . pravastatin (PRAVACHOL) 10 MG tablet TAKE 1 TABLET BY MOUTH EVERY DAY  60 tablet  3  . [DISCONTINUED] doxazosin (CARDURA) 4 MG tablet TAKE 1 TABLET BY MOUTH TWICE A DAY  180 tablet  1    Allergies: Allergies  Allergen Reactions  . Codeine     REACTION: Unknown reaction  . Penicillins     REACTION: Unknown reaction      Objective:   Physical Exam: There were no vitals filed for this visit.  General: Vital signs reviewed and noted. Well-developed, well-nourished, in no acute distress; alert, appropriate and cooperative throughout examination. HEENT: Normocephalic, atraumatic, mild maxillary tenderness Lungs: Normal  respiratory effort. Clear to auscultation BL without crackles or wheezes. Heart: RRR. S1 and S2 normal without gallop, murmur, or rubs. Abdomen:BS normoactive. Soft, Nondistended, non-tender.  No masses or organomegaly. Extremities: No  pretibial edema. Left ankle non tender, no erythema or warmth.      Assessment & Plan:

## 2012-12-02 ENCOUNTER — Encounter: Payer: Self-pay | Admitting: Internal Medicine

## 2012-12-02 DIAGNOSIS — R635 Abnormal weight gain: Secondary | ICD-10-CM | POA: Insufficient documentation

## 2012-12-02 LAB — LIPID PANEL
Cholesterol: 164 mg/dL (ref 0–200)
HDL: 38 mg/dL — ABNORMAL LOW (ref >39)
Total CHOL/HDL Ratio: 4.3 Ratio
Triglycerides: 166 mg/dL — ABNORMAL HIGH (ref <150)

## 2012-12-02 LAB — COMPLETE METABOLIC PANEL WITH GFR
Alkaline Phosphatase: 79 U/L (ref 39–117)
Creat: 1.04 mg/dL (ref 0.50–1.35)
GFR, Est African American: >89 mL/min
GFR, Est Non African American: 82 mL/min
Glucose, Bld: 119 mg/dL — ABNORMAL HIGH (ref 70–99)
Sodium: 142 mEq/L (ref 135–145)
Total Bilirubin: 0.5 mg/dL (ref 0.3–1.2)
Total Protein: 6.4 g/dL (ref 6.0–8.3)

## 2012-12-02 LAB — CBC
HCT: 44.1 % (ref 39.0–52.0)
MCH: 32.3 pg (ref 26.0–34.0)
MCHC: 34.7 g/dL (ref 30.0–36.0)
MCV: 93.2 fL (ref 78.0–100.0)
Platelets: 201 10*3/uL (ref 150–400)
RDW: 13.1 % (ref 11.5–15.5)

## 2012-12-02 NOTE — Assessment & Plan Note (Signed)
He has gained ~25 lbs since his last visit. We discussed lifestyle changes including diet and exercise during the visit. - Would also check TSH.

## 2013-01-28 ENCOUNTER — Other Ambulatory Visit: Payer: Self-pay | Admitting: Internal Medicine

## 2013-02-03 ENCOUNTER — Other Ambulatory Visit: Payer: Self-pay | Admitting: Internal Medicine

## 2013-06-03 ENCOUNTER — Telehealth: Payer: Self-pay | Admitting: *Deleted

## 2013-06-03 ENCOUNTER — Encounter (HOSPITAL_COMMUNITY): Payer: Self-pay | Admitting: Emergency Medicine

## 2013-06-03 ENCOUNTER — Emergency Department (HOSPITAL_COMMUNITY)
Admission: EM | Admit: 2013-06-03 | Discharge: 2013-06-03 | Disposition: A | Discharge status: Home / Self Care | Payer: Self-pay | Source: Home / Self Care | Attending: Emergency Medicine | Admitting: Emergency Medicine

## 2013-06-03 DIAGNOSIS — R5381 Other malaise: Secondary | ICD-10-CM

## 2013-06-03 DIAGNOSIS — R5383 Other fatigue: Secondary | ICD-10-CM

## 2013-06-03 DIAGNOSIS — M1990 Unspecified osteoarthritis, unspecified site: Secondary | ICD-10-CM

## 2013-06-03 DIAGNOSIS — M129 Arthropathy, unspecified: Secondary | ICD-10-CM

## 2013-06-03 HISTORY — DX: Disorder of prostate, unspecified: N42.9

## 2013-06-03 LAB — POCT I-STAT, CHEM 8
HCT: 47 % (ref 39.0–52.0)
Hemoglobin: 16 g/dL (ref 13.0–17.0)
Potassium: 3.9 mEq/L (ref 3.5–5.1)
Sodium: 143 mEq/L (ref 135–145)

## 2013-06-03 MED ORDER — NAPROXEN 500 MG PO TABS: 500.0000 mg | ORAL_TABLET | Freq: Two times a day (BID) | ORAL | Status: DC

## 2013-06-03 NOTE — ED Notes (Signed)
Blood drawn for Lyme IgC-IgM by Neill Loft.

## 2013-06-03 NOTE — ED Provider Notes (Addendum)
History     CSN: [id number]  Arrival date & time 06/03/13  1612   First MD Initiated Contact with Patient 06/03/13 1659      Chief Complaint  Patient presents with  . Insect Bite    (Consider location/radiation/quality/duration/timing/severity/associated sxs/prior treatment) HPI Comments: Pt presents c/o potential lyme disease.  For 2 weeks, he has had some generalized joint stiffness, mild intermittent headaches, a lack of energy, intermittent subjective fever/chills.  He thinks he can feel a tick attached to his leg but is not sure bc he cannot see it.  He has not noticed any rashes.  [place] contact with a tick although he does not know if he has been bitten.  Denies SOB, CP, palpitations, NVD, tarry stools, dark urine, weight loss, night sweats.  He does have a Hx of arthritis but he says the pain he is describing here today is worse.     Past Medical History  Diagnosis Date  . Hyperlipidemia   . Hypertension   . Renal calculus   . Prostate disorder     Past Surgical History  Procedure Laterality Date  . Hernia repair    . Left ankle reconstruction  1999    self reported   . Hernia repair    . Rotator cuff repair    . Brain surgery    . Appendectomy    . Finger surgery      History reviewed. No pertinent family history.  History  Substance Use Topics  . Smoking status: Current Every Day Smoker -- 2.00 packs/day for 40 years    Types: Cigarettes  . Smokeless tobacco: Not on file  . Alcohol Use: No     Comment: Quit drinking in 1988      Review of Systems  Constitutional: Positive for fever, chills, appetite change (decreased appetite) and fatigue.  HENT: Negative for sore throat, neck pain and neck stiffness.   Eyes: Negative for visual disturbance.  Respiratory: Negative for cough, chest tightness and shortness of breath.   Cardiovascular: Negative for chest pain, palpitations and leg swelling.   Gastrointestinal: Negative for nausea, vomiting, abdominal pain, diarrhea and constipation.  Endocrine: Negative for cold intolerance, heat intolerance, polydipsia and polyuria.  Genitourinary: Negative for dysuria, urgency, frequency, hematuria, flank pain and difficulty urinating.  Musculoskeletal: Positive for arthralgias. Negative for myalgias, joint swelling and gait problem.  Skin: Negative for rash.  Neurological: Positive for headaches. Negative for dizziness, seizures, weakness and light-headedness.  Hematological: Does not bruise/bleed easily.  Psychiatric/Behavioral: Negative for hallucinations, sleep disturbance, decreased concentration and agitation.    Allergies  Codeine and Penicillins  Home Medications   Current Outpatient Rx  Name  Route  Sig  Dispense  Refill  . doxazosin (CARDURA) 4 MG tablet      TAKE 1 TABLET BY MOUTH TWICE A DAY   180 tablet   1   . lisinopril (PRINIVIL,ZESTRIL) 10 MG tablet      TAKE 1 TABLET BY MOUTH EVERY DAY   30 tablet   3   . lisinopril (PRINIVIL,ZESTRIL) 10 MG tablet      TAKE 1 TABLET BY MOUTH EVERY DAY   30 tablet   3   . mometasone (NASONEX) 50 MCG/ACT nasal spray   Nasal   Place 2 sprays into the nose daily.   17 g   3   . naproxen (NAPROSYN) 500 MG tablet   Oral  Take 1 tablet (500 mg total) by mouth 2 (two) times daily.   60 tablet   0   . pravastatin (PRAVACHOL) 10 MG tablet      TAKE 1 TABLET BY MOUTH EVERY DAY   60 tablet   3     BP 145/86  Pulse 95  Temp(Src) 97.8 F (36.6 C) (Oral)  Resp 16  SpO2 100%  Physical Exam  Constitutional: He is oriented to person, place, and time. He appears well-developed and well-nourished. No distress.  HENT:  Head: Normocephalic and atraumatic.  Mouth/Throat: Oropharynx is clear and moist.  Eyes: Conjunctivae and EOM are normal. Pupils are equal, round, and reactive to light.  Neck: Normal range of motion. Neck supple. No JVD present. No tracheal deviation  present.  Cardiovascular: Normal rate, regular rhythm and normal heart sounds.  Exam reveals no gallop and no friction rub.   No murmur heard. Pulmonary/Chest: Effort normal and breath sounds normal. No respiratory distress. He has no wheezes. He has no rales.  Abdominal: Soft. He exhibits no distension and no mass. There is no tenderness.  Musculoskeletal: Normal range of motion. He exhibits no edema and no tenderness.  Lymphadenopathy:    He has no cervical adenopathy.  Neurological: He is oriented to person, place, and time.  Skin: Skin is warm and dry. Lesion (There are 2 small dark spots on the legs that pt says are wounds that are very slow to heal) noted. No ecchymosis and no rash noted. He is not diaphoretic.  Psychiatric: He has a normal mood and affect. His behavior is normal. Judgment and thought content normal.    ED Course  Procedures (including critical care time)  Labs Reviewed  POCT I-STAT, CHEM 8 - Abnormal; Notable for the following:    Glucose, Bld 121 (*)    All other components within normal limits   No results found.   1. Fatigue   2. Arthritis       MDM  No tick or target lesion found on exam - the thing he thought was a tick was actually a skin tag.  Lyme titers sent.  Will use BID naproxen for now and have him f/u with his PCP.  There is no obvious reason for his symptoms and his exam is fairly normal.  This does not have the features of an acute process and his vital signs and istat are all normal.  Will treat him with BID naproxen for now and he will call his PCP tomorrow to set up appt.    Meds ordered this encounter  Medications  . naproxen (NAPROSYN) 500 MG tablet    Sig: Take 1 tablet (500 mg total) by mouth 2 (two) times daily.    Dispense:  60 tablet    Refill:  0   Graylon Good, PA-C 06/03/13 2100   This patient is a lyme titer came back positive. Will prescribe doxycycline twice a day for 2 weeks.  Graylon Good, PA-C 06/12/13  1525

## 2013-06-03 NOTE — Telephone Encounter (Signed)
Pt calls and states about 4-5 days ago he found what he thinks is a tick attached to him, back top of thigh, he cannot see it but feels it. He is now getting sick w/ low fever (he cannot tell me what it is), chills, nausea, aches over all body, h/a. He is ask to go to urg care or ED now, he states he will go.

## 2013-06-03 NOTE — ED Notes (Addendum)
Pt c/o bump on lower buttocks x 3 weeks. Is painful and sore. Has not been able to see it due to location. When he showers it is painful. All of his joints feel very sore and stiff. Has a hx of recurrent boils. Pt is alert and oriented.

## 2013-06-04 LAB — B. BURGDORFI ANTIBODIES: B burgdorferi Ab IgG+IgM: 1.81 {ISR} — ABNORMAL HIGH

## 2013-06-04 NOTE — ED Provider Notes (Signed)
Medical screening examination/treatment/procedure(s) were performed by non-physician practitioner and as supervising physician I was immediately available for consultation/collaboration.  Raynald Blend, MD 06/04/13 [phone]

## 2013-06-05 ENCOUNTER — Other Ambulatory Visit: Payer: Self-pay | Admitting: Internal Medicine

## 2013-06-05 LAB — B. BURGDORFI ANTIBODIES BY WB: B burgdorferi IgG Abs (IB): NEGATIVE

## 2013-06-07 NOTE — ED Notes (Signed)
Dr Ladon Applebaum reviewed lab and considered lab report positive . Attempted x 3 self dialed and x 1 hospital operator w same resultant heavy static on line . Unable to contact patient to advise of positive report. Letter sent

## 2013-06-07 NOTE — ED Notes (Signed)
Chart review.

## 2013-06-12 MED ORDER — DOXYCYCLINE HYCLATE 100 MG PO CAPS: 100.0000 mg | ORAL_CAPSULE | Freq: Two times a day (BID) | ORAL | Status: DC

## 2013-06-15 NOTE — ED Notes (Addendum)
Patient responded to letter sent . Discussed positive lyme disease findings. States he has not yet started his rx for lyme, but will start today. Instructed may not feel significant improvement right away, but if he feels worse or develops new symptoms, he is to go to the ED

## 2013-06-16 NOTE — ED Provider Notes (Signed)
Medical screening examination/treatment/procedure(s) were performed by non-physician practitioner and as supervising physician I was immediately available for consultation/collaboration.  Raynald Blend, MD 06/16/13 [phone]

## 2013-08-20 ENCOUNTER — Telehealth: Payer: Self-pay | Admitting: *Deleted

## 2013-08-20 ENCOUNTER — Other Ambulatory Visit: Payer: Self-pay | Admitting: Internal Medicine

## 2013-08-20 DIAGNOSIS — E785 Hyperlipidemia, unspecified: Secondary | ICD-10-CM

## 2013-08-20 DIAGNOSIS — F411 Generalized anxiety disorder: Secondary | ICD-10-CM

## 2013-08-20 NOTE — Telephone Encounter (Signed)
Pt states on 8/21 he was at school, up walking became diaphoretic, weak, pale, ems was called, his blood sugar was in the 200's, had eaten appr 1 hr before and had a few slices of pizza. He states he felt really bad, heart rate increased and bp was somewhere around 180/80. He was advised to go to ED and refused as he refuses today, states he wants an appt for tomorrow, it is given w/ dr gill, his pcp. He is advised that if he has another episode to call 911 and come to ED, he says "ok" but is agreeable w/ appt

## 2013-08-20 NOTE — Telephone Encounter (Signed)
OK 

## 2013-08-20 NOTE — Telephone Encounter (Signed)
I'll see him tomorrow

## 2013-08-21 ENCOUNTER — Encounter: Payer: Self-pay | Admitting: Internal Medicine

## 2013-08-21 ENCOUNTER — Ambulatory Visit (HOSPITAL_COMMUNITY)
Admission: RE | Admit: 2013-08-21 | Discharge: 2013-08-21 | Disposition: A | Discharge status: Home / Self Care | Payer: Self-pay | Source: Ambulatory Visit | Attending: Internal Medicine | Admitting: Internal Medicine

## 2013-08-21 ENCOUNTER — Ambulatory Visit (INDEPENDENT_AMBULATORY_CARE_PROVIDER_SITE_OTHER): Payer: Self-pay | Admitting: Internal Medicine

## 2013-08-21 VITALS — BP 153/94 | HR 96 | Temp 97.4°F | Ht 69.0 in | Wt 226.0 lb

## 2013-08-21 DIAGNOSIS — R079 Chest pain, unspecified: Secondary | ICD-10-CM | POA: Insufficient documentation

## 2013-08-21 DIAGNOSIS — R9431 Abnormal electrocardiogram [ECG] [EKG]: Secondary | ICD-10-CM | POA: Insufficient documentation

## 2013-08-21 DIAGNOSIS — I10 Essential (primary) hypertension: Secondary | ICD-10-CM

## 2013-08-21 DIAGNOSIS — E785 Hyperlipidemia, unspecified: Secondary | ICD-10-CM

## 2013-08-21 DIAGNOSIS — R0789 Other chest pain: Secondary | ICD-10-CM | POA: Insufficient documentation

## 2013-08-21 DIAGNOSIS — Z23 Encounter for immunization: Secondary | ICD-10-CM

## 2013-08-21 LAB — COMPLETE METABOLIC PANEL WITH GFR
ALT: 70 U/L — ABNORMAL HIGH (ref 0–53)
AST: 44 U/L — ABNORMAL HIGH (ref 0–37)
Alkaline Phosphatase: 86 U/L (ref 39–117)
BUN: 19 mg/dL (ref 6–23)
Calcium: 9.3 mg/dL (ref 8.4–10.5)
Chloride: 106 mEq/L (ref 96–112)
Creat: 0.97 mg/dL (ref 0.50–1.35)
GFR, Est African American: >89 mL/min
Potassium: 3.8 mEq/L (ref 3.5–5.3)
Total Bilirubin: 0.4 mg/dL (ref 0.3–1.2)

## 2013-08-21 LAB — CBC WITH DIFFERENTIAL/PLATELET
Basophils Absolute: 0.1 10*3/uL (ref 0.0–0.1)
Eosinophils Absolute: 0.2 10*3/uL (ref 0.0–0.7)
Hemoglobin: 15.4 g/dL (ref 13.0–17.0)
Lymphocytes Relative: 28 % (ref 12–46)
Lymphs Abs: 2.5 10*3/uL (ref 0.7–4.0)
MCV: 95.1 fL (ref 78.0–100.0)
Monocytes Relative: 7 % (ref 3–12)
Neutrophils Relative %: 61 % (ref 43–77)
Platelets: 165 10*3/uL (ref 150–400)
RBC: 4.52 MIL/uL (ref 4.22–5.81)
WBC: 8.8 10*3/uL (ref 4.0–10.5)

## 2013-08-21 LAB — POCT GLYCOSYLATED HEMOGLOBIN (HGB A1C): Hemoglobin A1C: 5.8

## 2013-08-21 LAB — TROPONIN I: Troponin I: <0.3 ng/mL (ref <0.30)

## 2013-08-21 NOTE — Assessment & Plan Note (Addendum)
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  He walked to another booth and noticed that his entire shirt was soaked and he began feeling weak all over with associated nausea.  He denies any CP at that time but reports having left sided CP radiating to his left shoulder the previous evening which lasted for about 30 seconds.  He also reports having another episode of diaphoresis later on that evening.  He describes the pain as dull and achy.  He does not recall a FH of CAD.  He has risk factors for a ACS including age, HTN, Hyperlipidemia, and smoking.  TIMI score: 1, which puts him at  5% risk at 14 days of: all-cause mortality, new or recurrent MI, or severe recurrent ischemia requiring urgent revascularization.  No pertinent findings on physical exam.  Possibilities include: ACS, Anxiety (he has a h/o), GERD (h/o), Aortic Dissection (no tearing chest pain and lasted only a few seconds), Pneumothorax (unlikely-no c/o SOB).  ACS is likely given his risk factors.  However, the pain was non-exertional and lasted only a few seconds.  Anxiety is equally as possible given his h/o and he has been under stress trying to finish his degree.      -12 lead EKG; showed nonspecific T wave changes -stat troponin; negative -ASA 325mg  -cardiology consult -already on statin (pravastatin) -f/u in 1 week

## 2013-08-21 NOTE — Progress Notes (Signed)
I saw and evaluated the patient.  I personally confirmed the key portions of the history and exam documented by Dr. Gill and I reviewed pertinent patient test results.  The assessment, diagnosis, and plan were formulated together and I agree with the documentation in the resident's note. 

## 2013-08-21 NOTE — Patient Instructions (Addendum)
Return to the clinic in 1 week  1. Please take a 325mg  ASA per day 2. Continue to take your current medications

## 2013-08-21 NOTE — Progress Notes (Signed)
Patient ID: [name] Acosta, male   DOB: Jan 29, 1960, 53 y.o.   MRN: [id number]    Subjective:   Patient ID: [name] Acosta male   DOB: [birth year] 53 y.o.   MRN: [id number]  HPI: [name] is a 53 y.o. who comes in for an acute visit.  He has a PMH outlined below.  Please see problem based assessment and plan below for further details.   Past Medical History  Diagnosis Date  . Hyperlipidemia   . Hypertension   . Renal calculus   . Prostate disorder    Current Outpatient Prescriptions  Medication Sig Dispense Refill  . doxazosin (CARDURA) 4 MG tablet TAKE 1 TABLET BY MOUTH TWICE A DAY  180 tablet  1  . lisinopril (PRINIVIL,ZESTRIL) 10 MG tablet TAKE 1 TABLET BY MOUTH EVERY DAY  30 tablet  3  . doxycycline (VIBRAMYCIN) 100 MG capsule Take 1 capsule (100 mg total) by mouth 2 (two) times daily.  28 capsule  0  . mometasone (NASONEX) 50 MCG/ACT nasal spray Place 2 sprays into the nose daily.  17 g  3  . naproxen (NAPROSYN) 500 MG tablet Take 1 tablet (500 mg total) by mouth 2 (two) times daily.  60 tablet  0  . pravastatin (PRAVACHOL) 10 MG tablet TAKE 1 TABLET BY MOUTH EVERY DAY  60 tablet  3   No current facility-administered medications for this visit.   No family history on file. History   Social History  . Marital Status: Legally Separated    Spouse Name: N/A    Number of Children: N/A  . Years of Education: N/A   Social History Main Topics  . Smoking status: Current Every Day Smoker -- 2.00 packs/day for 40 years    Types: Cigarettes  . Smokeless tobacco: Not on file     Comment: DOWN TO 1.5 PER DAY  . Alcohol Use: No     Comment: Quit drinking in 1988  . Drug Use: Not on file  . Sexual Activity: Not on file   Other Topics Concern  . Not on file   Social History Narrative  . No narrative on file   Review of Systems: Review of Systems  Constitutional: Positive for malaise/fatigue. Negative for fever, chills and weight loss.  Eyes: Positive for blurred  vision.  Respiratory: Positive for cough and wheezing. Negative for shortness of breath.   Cardiovascular: Positive for chest pain, palpitations and leg swelling. Negative for orthopnea and PND.  Gastrointestinal: Positive for diarrhea. Negative for nausea, vomiting, abdominal pain and constipation.  Genitourinary: Positive for dysuria.  Musculoskeletal: Negative for back pain.  Skin: Negative for rash.  Neurological: Positive for focal weakness and headaches. Negative for dizziness.  Psychiatric/Behavioral: Negative for depression, suicidal ideas and substance abuse. The patient is nervous/anxious.      Objective:  Physical Exam: Filed Vitals:   08/21/13 1120  BP: 153/94  Pulse: 96  Temp: 97.4 F (36.3 C)  TempSrc: Oral  Height: 5\' 9"  (1.753 m)  Weight: 226 lb (102.513 kg)  SpO2: 96%    Assessment & Plan:

## 2013-08-21 NOTE — Assessment & Plan Note (Signed)
Pt at goal: LDL<100  Lab Results  Component Value Date   LDLCALC 93 12/01/2012   -continue pravastatin 10mg  daily

## 2013-08-21 NOTE — Assessment & Plan Note (Signed)
BP Readings from Last 3 Encounters:  08/21/13 153/94  06/03/13 145/86  12/01/12 137/92    Lab Results  Component Value Date   NA 143 06/03/2013   K 3.9 06/03/2013   CREATININE 0.90 06/03/2013    Assessment: Blood pressure control: mildly elevated Progress toward BP goal:  deteriorated  Plan: Medications:  continue current medications Other plans: no changes today; will continue to monitor

## 2013-08-24 LAB — GLUCOSE, CAPILLARY: Glucose-Capillary: 104 mg/dL — ABNORMAL HIGH (ref 70–99)

## 2013-08-28 ENCOUNTER — Ambulatory Visit: Payer: Self-pay | Admitting: Internal Medicine

## 2013-11-02 ENCOUNTER — Ambulatory Visit (INDEPENDENT_AMBULATORY_CARE_PROVIDER_SITE_OTHER): Payer: Self-pay | Admitting: Internal Medicine

## 2013-11-02 ENCOUNTER — Encounter: Payer: Self-pay | Admitting: Internal Medicine

## 2013-11-02 VITALS — BP 151/85 | HR 114 | Temp 97.1°F | Ht 69.0 in | Wt 228.5 lb

## 2013-11-02 DIAGNOSIS — I10 Essential (primary) hypertension: Secondary | ICD-10-CM

## 2013-11-02 DIAGNOSIS — F17200 Nicotine dependence, unspecified, uncomplicated: Secondary | ICD-10-CM

## 2013-11-02 DIAGNOSIS — R7402 Elevation of levels of lactic acid dehydrogenase (LDH): Secondary | ICD-10-CM

## 2013-11-02 DIAGNOSIS — E785 Hyperlipidemia, unspecified: Secondary | ICD-10-CM

## 2013-11-02 DIAGNOSIS — R079 Chest pain, unspecified: Secondary | ICD-10-CM

## 2013-11-02 DIAGNOSIS — Z0000 Encounter for general adult medical examination without abnormal findings: Secondary | ICD-10-CM

## 2013-11-02 DIAGNOSIS — R7401 Elevation of levels of liver transaminase levels: Secondary | ICD-10-CM | POA: Insufficient documentation

## 2013-11-02 LAB — LIPID PANEL
Cholesterol: 223 mg/dL — ABNORMAL HIGH (ref 0–200)
HDL: 35 mg/dL — ABNORMAL LOW (ref >39)
Total CHOL/HDL Ratio: 6.4 Ratio
Triglycerides: 157 mg/dL — ABNORMAL HIGH (ref <150)

## 2013-11-02 LAB — COMPLETE METABOLIC PANEL WITH GFR
ALT: 70 U/L — ABNORMAL HIGH (ref 0–53)
AST: 36 U/L (ref 0–37)
Albumin: 4.3 g/dL (ref 3.5–5.2)
Alkaline Phosphatase: 72 U/L (ref 39–117)
Chloride: 105 mEq/L (ref 96–112)
Potassium: 3.9 mEq/L (ref 3.5–5.3)
Sodium: 141 mEq/L (ref 135–145)
Total Protein: 6.4 g/dL (ref 6.0–8.3)

## 2013-11-02 MED ORDER — PRAVASTATIN SODIUM 10 MG PO TABS: ORAL_TABLET | ORAL | Status: DC

## 2013-11-02 MED ORDER — LISINOPRIL 20 MG PO TABS: 20.0000 mg | ORAL_TABLET | Freq: Every day | ORAL | Status: DC

## 2013-11-02 NOTE — Assessment & Plan Note (Signed)
BP Readings from Last 3 Encounters:  11/02/13 151/85  08/21/13 153/94  06/03/13 145/86    Lab Results  Component Value Date   NA 145 08/21/2013   K 3.8 08/21/2013   CREATININE 0.97 08/21/2013    Assessment: Blood pressure control: mildly elevated Progress toward BP goal:  deteriorated Comments: pt reports compliance with meds  Plan: Medications: increase lisinopril from 10mg  to 20mg  daily Other plans: pt continues to have recurrent chest pain; monitor blood pressure for improvement; may need to add an additional agent; recheck BMP on next visit to check creatinine

## 2013-11-02 NOTE — Progress Notes (Signed)
Patient ID: [name] Acosta, male   DOB: Apr 09, 1960, 53 y.o.   MRN: [id number]    Subjective:   Patient ID: [name] Acosta male   DOB: 25-Oct-1960 53 y.o.   MRN: [id number]  HPI: [name] is a 53 y.o. here for a follow-up visit.  He has a PMH outlined below.  He continues to have episodes of  non-exertional substernal CP radiating into his left arm about once a week associated with diaphoresis, nausea, SOB, and lightheadness.  He has no other complaints.    Past Medical History  Diagnosis Date  . Hyperlipidemia   . Hypertension   . Renal calculus   . Prostate disorder    Current Outpatient Prescriptions  Medication Sig Dispense Refill  . doxazosin (CARDURA) 4 MG tablet TAKE 1 TABLET BY MOUTH TWICE A DAY  180 tablet  1  . doxycycline (VIBRAMYCIN) 100 MG capsule Take 1 capsule (100 mg total) by mouth 2 (two) times daily.  28 capsule  0  . lisinopril (PRINIVIL,ZESTRIL) 10 MG tablet TAKE 1 TABLET BY MOUTH EVERY DAY  30 tablet  3  . mometasone (NASONEX) 50 MCG/ACT nasal spray Place 2 sprays into the nose daily.  17 g  3  . naproxen (NAPROSYN) 500 MG tablet Take 1 tablet (500 mg total) by mouth 2 (two) times daily.  60 tablet  0  . pravastatin (PRAVACHOL) 10 MG tablet TAKE 1 TABLET BY MOUTH EVERY DAY  60 tablet  3   No current facility-administered medications for this visit.   No family history on file. History   Social History  . Marital Status: Legally Separated    Spouse Name: N/A    Number of Children: N/A  . Years of Education: N/A   Social History Main Topics  . Smoking status: Current Every Day Smoker -- 2.00 packs/day for 40 years    Types: Cigarettes  . Smokeless tobacco: None     Comment: DOWN TO 1.5 PER DAY  . Alcohol Use: No     Comment: Quit drinking in 1988  . Drug Use: None  . Sexual Activity: None   Other Topics Concern  . None   Social History Narrative  . None   Review of Systems:  Review of Systems  Constitutional: Positive for chills.  Negative for fever.  Eyes: Positive for blurred vision.  Respiratory: Positive for shortness of breath and wheezing. Negative for cough.   Cardiovascular: Positive for chest pain, claudication, leg swelling and PND. Negative for palpitations and orthopnea.  Gastrointestinal: Positive for heartburn, nausea and diarrhea. Negative for vomiting, abdominal pain, constipation and blood in stool.  Genitourinary: Positive for dysuria and frequency.  Musculoskeletal: Positive for back pain and myalgias.  Neurological: Positive for weakness and headaches. Negative for dizziness and tremors.  Psychiatric/Behavioral: Negative for depression and suicidal ideas. The patient has insomnia. The patient is not nervous/anxious.     Objective:  Physical Exam: Filed Vitals:   11/02/13 1427  BP: 151/85  Pulse: 114  Temp: 97.1 F (36.2 C)  TempSrc: Oral  Height: 5\' 9"  (1.753 m)  Weight: 228 lb 8 oz (103.647 kg)  SpO2: 96%   Constitutional: Vital signs reviewed.  Patient is a well-developed and well-nourished male in no acute distress and cooperative with exam.  Head: Normocephalic and atraumatic Eyes: PERRL, EOMI, conjunctivae normal, No scleral icterus.  Neck: Supple, Trachea midline .  Cardiovascular: RRR, S1 normal, S2 normal, no MRG, pulses symmetric and intact bilaterally Pulmonary/Chest: normal respiratory  effort, CTAB, no wheezes, rales, or rhonchi; decreased air movement b/l. Abdominal: Soft. Non-tender, non-distended, bowel sounds are normal, no masses, organomegaly, or guarding present.  Musculoskeletal: No joint deformities, erythema, or stiffness Neurological: A&O x3, cranial nerve II-XII are grossly intact, no focal motor deficit, sensory intact to light touch bilaterally.  Skin: Warm, dry and intact. No rash, cyanosis, or clubbing.  Psychiatric: Normal mood and affect.   Assessment & Plan:

## 2013-11-02 NOTE — Assessment & Plan Note (Signed)
Pt continues to have mild elevation in AST and ALT.  No h/o IV drug use.  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  No stigmata of chronic liver disease.  Lab Results  Component Value Date   ALT 70* 08/21/2013   AST 44* 08/21/2013   ALKPHOS 86 08/21/2013   BILITOT 0.4 08/21/2013   -check hepatitis panel (B&C) -repeat CMP

## 2013-11-02 NOTE — Assessment & Plan Note (Signed)
He continues to have episodes of  non-exertional substernal CP radiating into his left arm about once a week associated with diaphoresis, nausea, SOB, and lightheadedness.  On physical exam, nothing remarkable.    -check 12 lead EKG -Lipid profile, CMP -referral for cardiology (pt working with [name] to get the orange card)

## 2013-11-02 NOTE — Assessment & Plan Note (Addendum)
Lab Results  Component Value Date   CHOL 164 12/01/2012   CHOL 180 08/01/2011   CHOL 170 09/19/2010   Lab Results  Component Value Date   HDL 38* 12/01/2012   HDL 35* 08/01/2011   HDL 38* 09/19/2010   Lab Results  Component Value Date   LDLCALC 93 12/01/2012   LDLCALC 108* 08/01/2011   LDLCALC 112* 09/19/2010   Lab Results  Component Value Date   TRIG 166* 12/01/2012   TRIG 183* 08/01/2011   TRIG 102 09/19/2010   Lab Results  Component Value Date   CHOLHDL 4.3 12/01/2012   CHOLHDL 5.1 08/01/2011   CHOLHDL 4.5 Ratio 09/19/2010   -recheck lipid profile today -continue pravastatin

## 2013-11-02 NOTE — Assessment & Plan Note (Signed)
-  Pt working with [name] to get the orange card

## 2013-11-02 NOTE — Assessment & Plan Note (Signed)
Counseled on smoking cessation  

## 2013-11-02 NOTE — Patient Instructions (Signed)
We have increased your lisinopril to 20mg  daily We will refer you to a cardiologist to further evaluate your chest pain Please call 911 if you continue to have chest pain more frequently or that lasts longer than a few minutes

## 2013-11-03 LAB — HEPATITIS B SURFACE ANTIGEN: Hepatitis B Surface Ag: NEGATIVE

## 2013-11-03 LAB — HEPATITIS B CORE ANTIBODY, TOTAL: Hep B Core Total Ab: NONREACTIVE

## 2013-11-03 NOTE — Progress Notes (Signed)
I saw and evaluated the patient.  I personally confirmed the key portions of the history and exam documented by Dr. Delane Ginger and I reviewed pertinent patient test results.  The assessment, diagnosis, and plan were formulated together and I agree with the documentation in the resident's note.  Patient reports brief episodes of chest pain that occur about once a week, stable for several months, each lasting no longer than one minute; I agree with the plan for cardiology referral, and patient was advised to come in immediately if he has any worsening of his symptoms.

## 2013-11-17 ENCOUNTER — Other Ambulatory Visit: Payer: Self-pay | Admitting: *Deleted

## 2013-11-17 MED ORDER — LISINOPRIL 20 MG PO TABS: 20.0000 mg | ORAL_TABLET | Freq: Every day | ORAL | Status: DC

## 2013-11-17 NOTE — Telephone Encounter (Signed)
Needs scripts for 90 day supplies

## 2013-11-17 NOTE — Telephone Encounter (Signed)
done

## 2013-12-07 ENCOUNTER — Ambulatory Visit (INDEPENDENT_AMBULATORY_CARE_PROVIDER_SITE_OTHER): Payer: Self-pay | Admitting: Internal Medicine

## 2013-12-07 ENCOUNTER — Encounter: Payer: Self-pay | Admitting: Internal Medicine

## 2013-12-07 ENCOUNTER — Other Ambulatory Visit: Payer: Self-pay | Admitting: *Deleted

## 2013-12-07 VITALS — BP 128/89 | HR 100 | Temp 97.8°F | Ht 69.0 in | Wt 225.3 lb

## 2013-12-07 DIAGNOSIS — R079 Chest pain, unspecified: Secondary | ICD-10-CM

## 2013-12-07 DIAGNOSIS — E785 Hyperlipidemia, unspecified: Secondary | ICD-10-CM

## 2013-12-07 DIAGNOSIS — I10 Essential (primary) hypertension: Secondary | ICD-10-CM

## 2013-12-07 DIAGNOSIS — Z0000 Encounter for general adult medical examination without abnormal findings: Secondary | ICD-10-CM

## 2013-12-07 DIAGNOSIS — F17200 Nicotine dependence, unspecified, uncomplicated: Secondary | ICD-10-CM

## 2013-12-07 DIAGNOSIS — G8929 Other chronic pain: Secondary | ICD-10-CM

## 2013-12-07 MED ORDER — DOXAZOSIN MESYLATE 4 MG PO TABS: ORAL_TABLET | ORAL | Status: DC

## 2013-12-07 NOTE — Assessment & Plan Note (Addendum)
Pt is asymptomatic.  He has experienced elevated liver enzymes in the past.  Labs were done for hepatitis panel but all were wnl.  Possible related to NAFLD since he is obese and the elevations are very mild.  Pt denies any current alcohol use.  His AST returned to normal on last visit.  On exam, he has no stigmata of liver disease.  The most common cause of transaminitis is nonalcoholic fatty liver disease, which can affect up to 30% of the population. Other causes include alcoholic liver disease, medication-associated liver injury, viral hepatitis (hepatitis B and C), and hemochromatosis. He is on a statin would could account for the elevation.      Lab Results  Component Value Date   ALT 70* 11/02/2013   AST 36 11/02/2013   ALKPHOS 72 11/02/2013   BILITOT 0.6 11/02/2013   -no concerning symptoms/signs at this point -continue to monitor

## 2013-12-07 NOTE — Assessment & Plan Note (Signed)
  Assessment: Progress toward smoking cessation:  smoking the same amount Barriers to progress toward smoking cessation:  lack of motivation to quit  Plan: Instruction/counseling given:  I counseled patient on the dangers of tobacco use, advised patient to stop smoking, and reviewed strategies to maximize success. Educational resources provided:   gave him brochure for 1-800-quit-now Medications to assist with smoking cessation:  None

## 2013-12-07 NOTE — Assessment & Plan Note (Addendum)
Pt continues to have chest pain episodes that are not necessarily exertional.  He states he may get CP at "anytime".  He states the pain is substernal and radiates into his left arm and lasts for about 30 seconds to a minute.  He has experienced this about 2 times this month.  He endorses associated N/V, diaphoresis, lightheadedness.  On exam, there are no remarkable findings.  We would refer him to a cardiologist but he is uninsured and has not obtained the orange card although he appears to be working on this.  EKG on last visit was without acute changes.  Chest pain could be related to anxiety which he has experienced in the past or possibly GERD, however, CAD must be d/t pt having risk factors.   -advised him to go to the ED if he continues to have severe chest pain or chest pain that is unremitting -continue 81mg  ASA daily -advised him to quit smoking and gave him a brochure for 1800quitnow -BP is at goal; continue current medications -HA1c wnl -follow up in 3 months -advised him to lose weight -continue pravachol 10mg 

## 2013-12-07 NOTE — Assessment & Plan Note (Signed)
Lab Results  Component Value Date   CHOL 223* 11/02/2013   HDL 35* 11/02/2013   LDLCALC 157* 11/02/2013   TRIG 157* 11/02/2013   CHOLHDL 6.4 11/02/2013   -may need to increase his pravachol since his LDL is not at goal; based on the ASCVD Risk Estimator he has a 69% lifetime risk and should be on a moderate to high intensity statin

## 2013-12-07 NOTE — Assessment & Plan Note (Signed)
-  discussed the importance of pt getting all of the documents into [facility] for the orange card; we are unable to get him into cardiology without this

## 2013-12-07 NOTE — Assessment & Plan Note (Addendum)
Pt continues to have chronic pain that is worse in his left arm.  Pt is on a statin which could account for myalgias.   -check CPK

## 2013-12-07 NOTE — Assessment & Plan Note (Signed)
BP Readings from Last 3 Encounters:  12/07/13 128/89  11/02/13 151/85  08/21/13 153/94    Lab Results  Component Value Date   NA 141 11/02/2013   K 3.9 11/02/2013   CREATININE 0.84 11/02/2013    Assessment: Blood pressure control: controlled Progress toward BP goal:  at goal  Plan: Medications:  continue current medications; lisinopril 20mg ; doxazosin 4mg  Other plans: encouraged pt to quit smoking and continue to take all of his medications as prescribed

## 2013-12-07 NOTE — Patient Instructions (Signed)
Please go to the Emergency Department if you continue to have chest pain Please continue to take your aspirin every day Please complete your orange card

## 2013-12-07 NOTE — Progress Notes (Signed)
Patient ID: [name] Acosta, male   DOB: 08-25-1960, 53 y.o.   MRN: [id number]    Subjective:   Patient ID: [name] Acosta male   DOB: [birth year] 53 y.o.   MRN: [id number]  HPI: [name] is a 53 y.o. here for a follow-up visit of ongoing chest pain.  He has a PMH outlined below.  Please see assessment and plan for further details of his medical history.    Past Medical History  Diagnosis Date  . Hyperlipidemia   . Hypertension   . Renal calculus   . Prostate disorder    Current Outpatient Prescriptions  Medication Sig Dispense Refill  . doxazosin (CARDURA) 4 MG tablet TAKE 1 TABLET BY MOUTH TWICE A DAY  180 tablet  0  . lisinopril (PRINIVIL,ZESTRIL) 20 MG tablet Take 1 tablet (20 mg total) by mouth daily.  90 tablet  4  . pravastatin (PRAVACHOL) 10 MG tablet TAKE 1 TABLET BY MOUTH EVERY DAY  60 tablet  3  . doxycycline (VIBRAMYCIN) 100 MG capsule Take 1 capsule (100 mg total) by mouth 2 (two) times daily.  28 capsule  0  . mometasone (NASONEX) 50 MCG/ACT nasal spray Place 2 sprays into the nose daily.  17 g  3  . naproxen (NAPROSYN) 500 MG tablet Take 1 tablet (500 mg total) by mouth 2 (two) times daily.  60 tablet  0   No current facility-administered medications for this visit.   No family history on file. History   Social History  . Marital Status: Legally Separated    Spouse Name: N/A    Number of Children: N/A  . Years of Education: N/A   Social History Main Topics  . Smoking status: Current Every Day Smoker -- 2.00 packs/day for 40 years    Types: Cigarettes  . Smokeless tobacco: Not on file     Comment: DOWN TO 1.5 PER DAY  . Alcohol Use: No     Comment: Quit drinking in 1988  . Drug Use: Not on file  . Sexual Activity: Not on file   Other Topics Concern  . Not on file   Social History Narrative  . No narrative on file   Review of Systems:  Review of Systems  Constitutional: Positive for malaise/fatigue and diaphoresis. Negative for fever, chills  and weight loss.  Eyes: Positive for blurred vision.  Respiratory: Positive for cough, shortness of breath and wheezing.   Cardiovascular: Positive for chest pain, palpitations, orthopnea and PND. Negative for leg swelling.  Gastrointestinal: Positive for heartburn, nausea, vomiting, abdominal pain and diarrhea. Negative for constipation.  Genitourinary: Negative for dysuria.  Musculoskeletal: Positive for myalgias.  Neurological: Positive for weakness and headaches. Negative for dizziness.  Psychiatric/Behavioral: Negative for depression.    Objective:  Physical Exam: Filed Vitals:   12/07/13 1403  BP: 128/89  Pulse: 100  Temp: 97.8 F (36.6 C)  TempSrc: Oral  Height: 5\' 9"  (1.753 m)  Weight: 102.195 kg (225 lb 4.8 oz)  SpO2: 96%    Constitutional: Vital signs reviewed.  Patient is a well-developed and well-nourished male in no acute distress and cooperative with exam.   Head: Normocephalic and atraumatic Eyes: PERRL, EOMI, conjunctivae normal, no scleral icterus.  Neck: Supple, Trachea midline normal ROM, No JVD, or carotid bruit present.  Cardiovascular: RRR, S1 normal, S2 normal, no MRG, pulses symmetric and intact bilaterally Pulmonary/Chest: normal respiratory effort, CTAB, no wheezes, rales, or rhonchi Abdominal: Soft. Non-tender, non-distended, bowel sounds are normal Neurological:  A&O x3, cranial nerve II-XII are grossly intact, no focal motor deficit  Skin: Warm, dry and intact. No rash, cyanosis, or clubbing.      Assessment & Plan:

## 2013-12-08 NOTE — Progress Notes (Signed)
I saw and evaluated the patient.  I personally confirmed the key portions of Dr. Shiela Mayer history and exam and reviewed pertinent patient test results.  The assessment, diagnosis, and plan were formulated together and I agree with the documentation in the resident's note. Discussed with the patient need for referral to cardiology, we are working on this. In the mean time, I advised him to go to the ED if he has CP again and he understood. He denied CP at this time.

## 2014-03-10 ENCOUNTER — Encounter: Payer: Self-pay | Admitting: Internal Medicine

## 2014-03-22 ENCOUNTER — Ambulatory Visit: Payer: Self-pay | Admitting: Internal Medicine

## 2014-03-25 ENCOUNTER — Ambulatory Visit: Payer: Self-pay | Admitting: Internal Medicine

## 2014-03-26 ENCOUNTER — Other Ambulatory Visit: Payer: Self-pay | Admitting: Internal Medicine

## 2014-03-29 ENCOUNTER — Ambulatory Visit (INDEPENDENT_AMBULATORY_CARE_PROVIDER_SITE_OTHER): Payer: Self-pay | Admitting: Internal Medicine

## 2014-03-29 ENCOUNTER — Encounter: Payer: Self-pay | Admitting: Internal Medicine

## 2014-03-29 DIAGNOSIS — I10 Essential (primary) hypertension: Secondary | ICD-10-CM

## 2014-03-29 DIAGNOSIS — E785 Hyperlipidemia, unspecified: Secondary | ICD-10-CM

## 2014-03-29 DIAGNOSIS — Z0000 Encounter for general adult medical examination without abnormal findings: Secondary | ICD-10-CM

## 2014-03-29 DIAGNOSIS — R079 Chest pain, unspecified: Secondary | ICD-10-CM

## 2014-03-29 DIAGNOSIS — G8929 Other chronic pain: Secondary | ICD-10-CM

## 2014-03-29 DIAGNOSIS — K219 Gastro-esophageal reflux disease without esophagitis: Secondary | ICD-10-CM

## 2014-03-29 DIAGNOSIS — F17200 Nicotine dependence, unspecified, uncomplicated: Secondary | ICD-10-CM

## 2014-03-29 DIAGNOSIS — M25579 Pain in unspecified ankle and joints of unspecified foot: Secondary | ICD-10-CM

## 2014-03-29 MED ORDER — PRAVASTATIN SODIUM 40 MG PO TABS: 40.0000 mg | ORAL_TABLET | Freq: Every day | ORAL | Status: DC

## 2014-03-29 MED ORDER — OMEPRAZOLE 20 MG PO CPDR: 20.0000 mg | DELAYED_RELEASE_CAPSULE | Freq: Every day | ORAL | Status: DC

## 2014-03-29 NOTE — Assessment & Plan Note (Signed)
-  although pt does not characterize acid reflux symptoms, he states that a baby ASA "tears his stomach up" -will begin omeprazole 20mg  daily which should help him tolerate his 81mg  ASA better

## 2014-03-29 NOTE — Progress Notes (Signed)
Patient ID: [name] Acosta, male   DOB: 15-May-1960, 54 y.o.   MRN: [id number]     Subjective:   Patient ID: [name] Acosta male    DOB: 1960/12/10 54 y.o.    MRN: [id number] Health Maintenance Due: No health maintenance topics applied.  _________________________________________________  HPI: [name] is a 54 y.o. male here for a routine visit.  Pt has a PMH outlined below.  Please see problem-based charting assessment and plan note for further details of medical issues addressed at today's visit.  PMH: Past Medical History  Diagnosis Date  . Hyperlipidemia   . Hypertension   . Renal calculus   . Prostate disorder     Medications: Current Outpatient Prescriptions on File Prior to Visit  Medication Sig Dispense Refill  . aspirin 81 MG tablet Take 81 mg by mouth daily.      Marland Kitchen doxazosin (CARDURA) 4 MG tablet TAKE 1 TABLET BY MOUTH TWICE A DAY  180 tablet  0  . lisinopril (PRINIVIL,ZESTRIL) 20 MG tablet Take 1 tablet (20 mg total) by mouth daily.  90 tablet  4   No current facility-administered medications on file prior to visit.    Allergies: Allergies  Allergen Reactions  . Codeine     REACTION: Unknown reaction  . Penicillins     REACTION: Unknown reaction    FH: No family history on file.  SH: History   Social History  . Marital Status: Legally Separated    Spouse Name: N/A    Number of Children: N/A  . Years of Education: N/A   Social History Main Topics  . Smoking status: Current Every Day Smoker -- 2.00 packs/day for 40 years    Types: Cigarettes  . Smokeless tobacco: Not on file     Comment: DOWN TO 1.5 PER DAY  . Alcohol Use: No     Comment: Quit drinking in 1988  . Drug Use: Not on file  . Sexual Activity: Not on file   Other Topics Concern  . Not on file   Social History Narrative  . No narrative on file    Review of Systems: Constitutional: Negative for fever, chills and weight loss.  Eyes: Negative for blurred vision.    Respiratory: Negative for cough and shortness of breath.  Cardiovascular: Positive for chest pain, palpitations and leg swelling.  Gastrointestinal: Negative for nausea, vomiting, abdominal pain, diarrhea, constipation and blood in stool.  Genitourinary: Negative for dysuria, urgency and frequency.  Musculoskeletal: Positive for myalgias and back pain.  Neurological: Negative for dizziness, weakness and headaches.     Objective:   Vital Signs: There were no vitals filed for this visit.    BP Readings from Last 3 Encounters:  12/07/13 128/89  11/02/13 151/85  08/21/13 153/94    Physical Exam: Constitutional: Vital signs reviewed.  Patient is well-developed and well-nourished male appearing disheveled but in NAD and cooperative with exam.  Head: Normocephalic and atraumatic. Eyes: PERRL, EOMI, conjunctivae nl, no scleral icterus.  Neck: Supple. Cardiovascular: tachycardic, regular rhythm, no MRG. Pulmonary/Chest: normal effort, non-tender to palpation, CTAB, no wheezes, rales, or rhonchi. Abdominal: Obese. Soft. NT/ND +BS. Neurological: A&O x3, cranial nerves II-XII are grossly intact, moving all extremities. Extremities: 2+DP b/l; trace pitting edema b/l.  Skin: Warm, dry and intact. No rash.  Most Recent Laboratory Results:  CMP     Component Value Date/Time   NA 141 11/02/2013 1617   K 3.9 11/02/2013 1617   CL 105 11/02/2013 1617  CO2 28 11/02/2013 1617   GLUCOSE 84 11/02/2013 1617   BUN 11 11/02/2013 1617   CREATININE 0.84 11/02/2013 1617   CREATININE 0.90 06/03/2013 1806   CALCIUM 8.9 11/02/2013 1617   PROT 6.4 11/02/2013 1617   ALBUMIN 4.3 11/02/2013 1617   AST 36 11/02/2013 1617   ALT 70* 11/02/2013 1617   ALKPHOS 72 11/02/2013 1617   BILITOT 0.6 11/02/2013 1617   GFRNONAA >89 11/02/2013 1617   GFRAA >89 11/02/2013 1617    CBC    Component Value Date/Time   WBC 8.8 08/21/2013 1258   RBC 4.52 08/21/2013 1258   HGB 15.4 08/21/2013 1258   HCT 43.0 08/21/2013 1258   PLT  165 08/21/2013 1258   MCV 95.1 08/21/2013 1258   MCH 34.1* 08/21/2013 1258   MCHC 35.8 08/21/2013 1258   RDW 12.8 08/21/2013 1258   LYMPHSABS 2.5 08/21/2013 1258   MONOABS 0.7 08/21/2013 1258   EOSABS 0.2 08/21/2013 1258   BASOSABS 0.1 08/21/2013 1258    Lipid Panel Lab Results  Component Value Date   CHOL 223* 11/02/2013   HDL 35* 11/02/2013   LDLCALC 157* 11/02/2013   TRIG 157* 11/02/2013   CHOLHDL 6.4 11/02/2013    HA1C Lab Results  Component Value Date   HGBA1C 5.8 08/21/2013    Urinalysis    Component Value Date/Time   COLORURINE YELLOW 08/23/2011 1539   APPEARANCEUR CLEAR 08/23/2011 1539   LABSPEC 1.025 08/23/2011 1539   PHURINE 6.0 08/23/2011 1539   GLUCOSEU NEG 08/23/2011 1539   GLUCOSEU NEG mg/dL 07/31/2010 2135   HGBUR LARGE* 08/23/2011 1539   BILIRUBINUR SMALL* 08/23/2011 1539   BILIRUBINUR NEGATIVE 08/01/2011 1623   KETONESUR TRACE* 08/23/2011 1539   PROTEINUR 30* 08/23/2011 1539   PROTEINUR TRACE 08/01/2011 1623   UROBILINOGEN 1 08/23/2011 1539   UROBILINOGEN 1.0 08/01/2011 1623   NITRITE NEG 08/23/2011 1539   NITRITE NEGATIVE 08/01/2011 1623   LEUKOCYTESUR NEG 08/23/2011 1539    Urine Microalbumin Lab Results  Component Value Date   MICROALBUR 2.59* 07/31/2010    Imaging N/A   Assessment & Plan:   Assessment and plan was discussed and formulated with my attending.  Patient should return to the [facility] [place] in 3 month(s).

## 2014-03-29 NOTE — Assessment & Plan Note (Addendum)
Pt describes substernal, non-exertional CP that radiates down both arms at times; he states the pain last several seconds and can come "at any time"; it is not exacerbated by anything or relieved by anything; he sometimes has nausea but no vomiting and no diaphoresis or syncope; he reports it is always the same pain; he reports it happening 2x in the past 2 weeks.  He has a FH of MI in his grandfather in his mid-late 42's and his mother recently had stents placed.  Unfortunately he is still smoking 1 ppd and reports having a chemical stress test in the 1990's.  He is on statin therapy (10mg  daily) and was taking a baby ASA but states that it "tears up his stomach."  Pt also seems to have a lot of anxiety as witnessed by his tachycardia and also carries this diagnosis.  Perhaps some of his CP is related to anxiety?  -he needs a stress test but unfortunately has still not completed his paperwork for the orange card  -will optimize his statin therapy to pravastatin 40mg  daily for primary prevention -will add omeprazole 20mg  daily for GERD since his CP appears to be atypical  -will ask that he continue his 81mg  ASA along with omeprazole on board and hopefully this will help -will consider adding a BB to his regimen at next visit if the addition of omeprazole does not resolve his CP -counseled on smoking cessation, weight loss, and exercise

## 2014-03-29 NOTE — Assessment & Plan Note (Addendum)
BP Readings from Last 3 Encounters:  12/07/13 128/89  11/02/13 151/85  08/21/13 153/94    Lab Results  Component Value Date   NA 141 11/02/2013   K 3.9 11/02/2013   CREATININE 0.84 11/02/2013    Assessment: Blood pressure control: controlled Progress toward BP goal:  at goal  Plan: Medications:  continue current medications of doxazosin 4mg  daily (pt also has BPH); lisinopril 20mg  daily Other plans: encouraged weight loss, exercise, smoking cessation; will consider adding a BB to his regimen if he continues to have CP after starting him on omeprazole therapy

## 2014-03-29 NOTE — Assessment & Plan Note (Signed)
  Assessment: Progress toward smoking cessation:  smoking less Barriers to progress toward smoking cessation:  lack of motivation to quit  Plan: Instruction/counseling given:  I counseled patient on the dangers of tobacco use, advised patient to stop smoking, and reviewed strategies to maximize success. Medications to assist with smoking cessation:  None; advised pt to call quit line to inquire about obtaining the patch for free Other plans: continue to counsel

## 2014-03-29 NOTE — Assessment & Plan Note (Addendum)
-  deferred; pt has been working on getting the orange card but cannot get his paperwork in and has transportation issues -counseled pt on weight loss, exercise, and smoking cessation

## 2014-03-29 NOTE — Assessment & Plan Note (Signed)
-  will check vitamin D level

## 2014-03-29 NOTE — Patient Instructions (Signed)
Thank you for your visit today.    Please return to the internal medicine clinic in 3 month(s) or sooner if needed.      Please take all medications as prescribed.     I have made the following additions/changes to your medications: Increased pravastatin to 40mg  at bedtime daily (you may take 4 of the 10mg  pills until you pick up your new prescription). I have also sent you a medication for your stomach to [facility] called omeprazole.  Please take this once daily before breakfast on an empty stomach.   Please continue to take your baby aspirin 81mg  daily.     I have made the following referrals for you: None.  You need to have a cardiology referral but will need you to get the orange card first.  Please bring in your paperwork so we can get this completed.     You need the following test(s) for regular health maintenance: Will discuss during next visit.    Please be sure to bring all of your medications with you to every visit; this includes herbal supplements, vitamins, eye drops, and any over-the-counter medications.    Should you have any questions regarding your medications and/or any new or worsening symptoms, please be sure to call the clinic at [phone].    If you believe that you are suffering from a life threatening condition or one that may result in the loss of limb or function, then you should call 911 or proceed to the nearest Emergency Department.     A healthy lifestyle and preventative care can promote health and wellness.   Maintain regular health, dental, and eye exams.  Eat a healthy diet. Foods like vegetables, fruits, whole grains, low-fat dairy products, and lean protein foods contain the nutrients you need without too many calories. Decrease your intake of foods high in solid fats, added sugars, and salt. Get information about a proper diet from your caregiver, if necessary.  Regular physical exercise is one of the most important things you can do for your  health. Most adults should get at least 150 minutes of moderate-intensity exercise (any activity that increases your heart rate and causes you to sweat) each week. In addition, most adults need muscle-strengthening exercises on 2 or more days a week.   Maintain a healthy weight. The body mass index (BMI) is a screening tool to identify possible weight problems. It provides an estimate of body fat based on height and weight. Your caregiver can help determine your BMI, and can help you achieve or maintain a healthy weight. For adults 20 years and older:  A BMI below 18.5 is considered underweight.  A BMI of 18.5 to 24.9 is normal.  A BMI of 25 to 29.9 is considered overweight.  A BMI of 30 and above is considered obese.

## 2014-03-29 NOTE — Assessment & Plan Note (Signed)
Lab Results  Component Value Date   CHOL 223* 11/02/2013   HDL 35* 11/02/2013   LDLCALC 157* 11/02/2013   TRIG 157* 11/02/2013   CHOLHDL 6.4 11/02/2013   -pt was not at goal; will increase his pravastatin to 40mg  daily (told him it was OK to take 4 10mg  daily since he still has some of the 10mg  until he gets the 40mg )

## 2014-03-30 ENCOUNTER — Other Ambulatory Visit: Payer: Self-pay | Admitting: Internal Medicine

## 2014-03-30 LAB — VITAMIN D 25 HYDROXY (VIT D DEFICIENCY, FRACTURES): Vit D, 25-Hydroxy: 13 ng/mL — ABNORMAL LOW (ref 30–89)

## 2014-03-30 MED ORDER — VITAMIN D3 1.25 MG (50000 UT) PO CAPS: 50000.0000 [IU] | ORAL_CAPSULE | ORAL | Status: DC

## 2014-03-30 NOTE — Progress Notes (Signed)
Case discussed with Dr. Gill at the time of the visit.  We reviewed the resident's history and exam and pertinent patient test results.  I agree with the assessment, diagnosis and plan of care documented in the resident's note. 

## 2014-03-30 NOTE — Progress Notes (Signed)
Pt informed and voices understanding 

## 2014-06-08 ENCOUNTER — Other Ambulatory Visit: Payer: Self-pay | Admitting: Internal Medicine

## 2014-09-02 ENCOUNTER — Other Ambulatory Visit: Payer: Self-pay | Admitting: Internal Medicine

## 2014-10-18 ENCOUNTER — Encounter: Payer: Self-pay | Admitting: Internal Medicine

## 2014-10-18 ENCOUNTER — Ambulatory Visit (INDEPENDENT_AMBULATORY_CARE_PROVIDER_SITE_OTHER): Payer: Self-pay | Admitting: Internal Medicine

## 2014-10-18 VITALS — BP 114/74 | HR 114 | Temp 97.9°F | Ht 69.0 in | Wt 229.7 lb

## 2014-10-18 DIAGNOSIS — Z23 Encounter for immunization: Secondary | ICD-10-CM

## 2014-10-18 DIAGNOSIS — I10 Essential (primary) hypertension: Secondary | ICD-10-CM

## 2014-10-18 DIAGNOSIS — Z0000 Encounter for general adult medical examination without abnormal findings: Secondary | ICD-10-CM

## 2014-10-18 DIAGNOSIS — E785 Hyperlipidemia, unspecified: Secondary | ICD-10-CM

## 2014-10-18 DIAGNOSIS — J309 Allergic rhinitis, unspecified: Secondary | ICD-10-CM

## 2014-10-18 LAB — CBC WITH DIFFERENTIAL/PLATELET
BASOS ABS: 0.1 10*3/uL (ref 0.0–0.1)
Basophils Relative: 1 % (ref 0–1)
EOS PCT: 2 % (ref 0–5)
Eosinophils Absolute: 0.2 10*3/uL (ref 0.0–0.7)
HEMATOCRIT: 44.5 % (ref 39.0–52.0)
HEMOGLOBIN: 15.9 g/dL (ref 13.0–17.0)
LYMPHS PCT: 25 % (ref 12–46)
Lymphs Abs: 2.5 10*3/uL (ref 0.7–4.0)
MCH: 33.1 pg (ref 26.0–34.0)
MCHC: 35.7 g/dL (ref 30.0–36.0)
MCV: 92.5 fL (ref 78.0–100.0)
MONO ABS: 0.7 10*3/uL (ref 0.1–1.0)
MONOS PCT: 7 % (ref 3–12)
NEUTROS ABS: 6.4 10*3/uL (ref 1.7–7.7)
Neutrophils Relative %: 65 % (ref 43–77)
Platelets: 180 10*3/uL (ref 150–400)
RBC: 4.81 MIL/uL (ref 4.22–5.81)
RDW: 12.8 % (ref 11.5–15.5)
WBC: 9.9 10*3/uL (ref 4.0–10.5)

## 2014-10-18 NOTE — Patient Instructions (Addendum)
Thank you for your visit today.   Please return to the internal medicine clinic in 6 month(s) or sooner if needed.     Your current medical regimen is effective;  continue present plan and take all medications as prescribed.   Please take 1000 units of Vitamin D daily.  You may take saline nasal spray for your allergies.  You may try zyrtec for your allergies.  Continue to use nasonex as needed.   Please be sure to bring all of your medications with you to every visit; this includes herbal supplements, vitamins, eye drops, and any over-the-counter medications.   Should you have any questions regarding your medications and/or any new or worsening symptoms, please be sure to call the clinic at [phone].   If you believe that you are suffering from a life threatening condition or one that may result in the loss of limb or function, then you should call 911 or proceed to the nearest Emergency Department.     A healthy lifestyle and preventative care can promote health and wellness.   Maintain regular health, dental, and eye exams.  Eat a healthy diet. Foods like vegetables, fruits, whole grains, low-fat dairy products, and lean protein foods contain the nutrients you need without too many calories. Decrease your intake of foods high in solid fats, added sugars, and salt. Get information about a proper diet from your caregiver, if necessary.  Regular physical exercise is one of the most important things you can do for your health. Most adults should get at least 150 minutes of moderate-intensity exercise (any activity that increases your heart rate and causes you to sweat) each week. In addition, most adults need muscle-strengthening exercises on 2 or more days a week.   Maintain a healthy weight. The body mass index (BMI) is a screening tool to identify possible weight problems. It provides an estimate of body fat based on height and weight. Your caregiver can help determine your BMI,  and can help you achieve or maintain a healthy weight. For adults 20 years and older:  A BMI below 18.5 is considered underweight.  A BMI of 18.5 to 24.9 is normal.  A BMI of 25 to 29.9 is considered overweight.  A BMI of 30 and above is considered obese.

## 2014-10-18 NOTE — Progress Notes (Signed)
Patient ID: [name] Acosta, male   DOB: [birth year], 54 y.o.   MRN: [id number]    Subjective:   Patient ID: [name] Acosta male    DOB: 1960-02-11 54 y.o.    MRN: [id number] Health Maintenance Due: There are no preventive care reminders to display for this patient.  _________________________________________________  HPI: [name] is a 54 y.o. male here for a acute visit.  Pt has a PMH outlined below.  Please see problem-based charting assessment and plan note for further details of medical issues addressed at today's visit.  PMH: Past Medical History  Diagnosis Date  . Hyperlipidemia   . Hypertension   . Renal calculus   . Prostate disorder     Medications: Current Outpatient Prescriptions on File Prior to Visit  Medication Sig Dispense Refill  . aspirin 81 MG tablet Take 81 mg by mouth daily.      Marland Kitchen doxazosin (CARDURA) 4 MG tablet TAKE 1 TABLET BY MOUTH TWICE A DAY  180 tablet  0  . lisinopril (PRINIVIL,ZESTRIL) 20 MG tablet Take 1 tablet (20 mg total) by mouth daily.  90 tablet  4  . omeprazole (PRILOSEC) 20 MG capsule Take 1 capsule (20 mg total) by mouth daily.  90 capsule  1  . pravastatin (PRAVACHOL) 40 MG tablet Take 1 tablet (40 mg total) by mouth at bedtime.  90 tablet  3   No current facility-administered medications on file prior to visit.    Allergies: Allergies  Allergen Reactions  . Codeine     REACTION: Unknown reaction  . Penicillins     REACTION: Unknown reaction    FH: No family history on file.  SH: History   Social History  . Marital Status: Legally Separated    Spouse Name: N/A    Number of Children: N/A  . Years of Education: N/A   Social History Main Topics  . Smoking status: Current Every Day Smoker -- 2.00 packs/day for 40 years    Types: Cigarettes  . Smokeless tobacco: None     Comment: DOWN TO 1.5 PER DAY  . Alcohol Use: No     Comment: Quit drinking in 1988  . Drug Use: None  . Sexual Activity: None   Other Topics  Concern  . None   Social History Narrative  . None    Review of Systems: Constitutional: Negative for fever, chills and weight loss.  Eyes: Negative for blurred vision.  Respiratory: Negative for cough and shortness of breath.  Cardiovascular: Negative for chest pain, palpitations and leg swelling.  Gastrointestinal: Negative for nausea, vomiting, abdominal pain, diarrhea, constipation and blood in stool.  Genitourinary: Negative for dysuria, urgency and frequency.  Musculoskeletal: Negative for myalgias and back pain.  Neurological: Negative for dizziness, weakness and headaches.     Objective:   Vital Signs: Filed Vitals:   10/18/14 1536  BP: 114/74  Pulse: 114  Temp: 97.9 F (36.6 C)  TempSrc: Oral  Height: 5\' 9"  (1.753 m)  Weight: 229 lb 11.2 oz (104.191 kg)  SpO2: 100%     BP Readings from Last 3 Encounters:  10/18/14 114/74  12/07/13 128/89  11/02/13 151/85    Physical Exam: Constitutional: Vital signs reviewed.  Patient is well-developed and well-nourished in NAD and cooperative with exam.  Head: Normocephalic and atraumatic. No sinus tenderness.  Eyes: PERRL, EOMI, conjunctivae nl, no scleral icterus.  Nose: no drainage Throat: no exudates or erythema Neck: Supple. Cardiovascular: RRR, no MRG. Pulmonary/Chest: normal effort,  non-tender to palpation, CTAB, no wheezes, rales, or rhonchi. Abdominal: Soft. NT/ND +BS. Neurological: A&O x3, cranial nerves II-XII are grossly intact, moving all extremities. Extremities: 2+DP b/l; no pitting edema. Skin: Warm, dry and intact. No rash.   Assessment & Plan:   Assessment and plan was discussed and formulated with my attending.

## 2014-10-19 LAB — COMPLETE METABOLIC PANEL WITH GFR
ALBUMIN: 4.2 g/dL (ref 3.5–5.2)
ALK PHOS: 80 U/L (ref 39–117)
ALT: 53 U/L (ref 0–53)
AST: 34 U/L (ref 0–37)
BILIRUBIN TOTAL: 0.5 mg/dL (ref 0.2–1.2)
BUN: 24 mg/dL — AB (ref 6–23)
CO2: 26 mEq/L (ref 19–32)
Calcium: 9.3 mg/dL (ref 8.4–10.5)
Chloride: 104 mEq/L (ref 96–112)
Creat: 0.9 mg/dL (ref 0.50–1.35)
GFR, Est African American: >89 mL/min
Glucose, Bld: 119 mg/dL — ABNORMAL HIGH (ref 70–99)
POTASSIUM: 4 meq/L (ref 3.5–5.3)
SODIUM: 142 meq/L (ref 135–145)
TOTAL PROTEIN: 6.3 g/dL (ref 6.0–8.3)

## 2014-10-19 NOTE — Assessment & Plan Note (Signed)
Pt presents with "sinusitis" with runny nose, congestion.  Denies fever/chills, SOB.  Has tried nasonex and OTC meds without much relief.  On exam, no sinus tenderness.  -continue nasonex (reminded him to point the nozzle away from his nasal bone) -advised him to avoid sudafed as this may worsen his BP -advised to try saline nasal spray

## 2014-10-19 NOTE — Assessment & Plan Note (Addendum)
-  influenza vaccine given today  -advised to take 1000 units of vitamin D daily due to VitD deficiency

## 2014-10-19 NOTE — Assessment & Plan Note (Signed)
BP Readings from Last 3 Encounters:  10/18/14 114/74  12/07/13 128/89  11/02/13 151/85    Lab Results  Component Value Date   NA 142 10/18/2014   K 4.0 10/18/2014   CREATININE 0.90 10/18/2014    Assessment: Blood pressure control: controlled Progress toward BP goal:  at goal  Plan: Medications:  continue current medications of lisinopril 20mg  and doxazosin 4mg  daily

## 2014-10-19 NOTE — Assessment & Plan Note (Signed)
-  continue pravastatin 40mg  daily

## 2014-10-20 NOTE — Progress Notes (Signed)
Case discussed with Dr. Gill soon after the resident saw the patient.  We reviewed the resident's history and exam and pertinent patient test results.  I agree with the assessment, diagnosis, and plan of care documented in the resident's note. 

## 2014-10-29 ENCOUNTER — Other Ambulatory Visit: Payer: Self-pay | Admitting: Internal Medicine

## 2014-12-07 ENCOUNTER — Ambulatory Visit: Payer: Self-pay

## 2014-12-15 ENCOUNTER — Ambulatory Visit: Payer: Self-pay

## 2014-12-15 ENCOUNTER — Ambulatory Visit (HOSPITAL_COMMUNITY)
Admission: RE | Admit: 2014-12-15 | Discharge: 2014-12-15 | Disposition: A | Discharge status: Home / Self Care | Payer: Self-pay | Source: Ambulatory Visit | Attending: Internal Medicine | Admitting: Internal Medicine

## 2014-12-15 ENCOUNTER — Other Ambulatory Visit: Payer: Self-pay | Admitting: Internal Medicine

## 2014-12-15 ENCOUNTER — Ambulatory Visit (INDEPENDENT_AMBULATORY_CARE_PROVIDER_SITE_OTHER): Payer: Self-pay | Admitting: Internal Medicine

## 2014-12-15 ENCOUNTER — Encounter: Payer: Self-pay | Admitting: Internal Medicine

## 2014-12-15 VITALS — BP 155/84 | HR 92 | Temp 97.8°F | Ht 69.0 in | Wt 229.7 lb

## 2014-12-15 DIAGNOSIS — R059 Cough, unspecified: Secondary | ICD-10-CM

## 2014-12-15 DIAGNOSIS — R0602 Shortness of breath: Secondary | ICD-10-CM | POA: Insufficient documentation

## 2014-12-15 DIAGNOSIS — R05 Cough: Secondary | ICD-10-CM

## 2014-12-15 DIAGNOSIS — J020 Streptococcal pharyngitis: Secondary | ICD-10-CM | POA: Insufficient documentation

## 2014-12-15 DIAGNOSIS — R509 Fever, unspecified: Secondary | ICD-10-CM | POA: Insufficient documentation

## 2014-12-15 LAB — CBC WITH DIFFERENTIAL/PLATELET
Basophils Absolute: 0.1 10*3/uL (ref 0.0–0.1)
Basophils Relative: 1 % (ref 0–1)
EOS ABS: 0.2 10*3/uL (ref 0.0–0.7)
EOS PCT: 2 % (ref 0–5)
HEMATOCRIT: 45.2 % (ref 39.0–52.0)
HEMOGLOBIN: 15.7 g/dL (ref 13.0–17.0)
LYMPHS ABS: 1.9 10*3/uL (ref 0.7–4.0)
LYMPHS PCT: 23 % (ref 12–46)
MCH: 32.8 pg (ref 26.0–34.0)
MCHC: 34.7 g/dL (ref 30.0–36.0)
MCV: 94.4 fL (ref 78.0–100.0)
MONO ABS: 0.7 10*3/uL (ref 0.1–1.0)
MONOS PCT: 8 % (ref 3–12)
MPV: 10.7 fL (ref 9.4–12.4)
Neutro Abs: 5.5 10*3/uL (ref 1.7–7.7)
Neutrophils Relative %: 66 % (ref 43–77)
Platelets: 199 10*3/uL (ref 150–400)
RBC: 4.79 MIL/uL (ref 4.22–5.81)
RDW: 12.3 % (ref 11.5–15.5)
WBC: 8.3 10*3/uL (ref 4.0–10.5)

## 2014-12-15 LAB — BASIC METABOLIC PANEL
BUN: 19 mg/dL (ref 6–23)
CALCIUM: 9.7 mg/dL (ref 8.4–10.5)
CHLORIDE: 104 meq/L (ref 96–112)
CO2: 27 meq/L (ref 19–32)
CREATININE: 1.01 mg/dL (ref 0.50–1.35)
GLUCOSE: 202 mg/dL — AB (ref 70–99)
Potassium: 4.4 mEq/L (ref 3.5–5.3)
Sodium: 143 mEq/L (ref 135–145)

## 2014-12-15 MED ORDER — ALBUTEROL SULFATE HFA 108 (90 BASE) MCG/ACT IN AERS: 2.0000 | INHALATION_SPRAY | Freq: Four times a day (QID) | RESPIRATORY_TRACT | Status: DC | PRN

## 2014-12-15 MED ORDER — AZITHROMYCIN 250 MG PO TABS: ORAL_TABLET | ORAL | Status: AC

## 2014-12-15 NOTE — Assessment & Plan Note (Addendum)
Productive cough with fever- 100.3, per patient. Also significant smoking history. Vitals stable- Pulse- 82bpm, Saturating 100% on RA, temperature here in clinic- 98.42F, but patient says he took tylenol 65mins prior to arrival. Physical exam unremarkable, except for reduced air flow bilaterally.   Plan- CBC - Bmet - Chest xray- Impression normal chest xray. - Z pac for 5 days - Albuterol inhaler to help with SOB, as patient is not moving a whole lot of air on exam.   Addendum- 12/16/2014-  Group A strep positive. Pt is already on appropriate antibiotic. Did not tolerate Penicillins in the past- hives and swelling.

## 2014-12-15 NOTE — Patient Instructions (Signed)
General Instructions: We will be prescribing a medication for you called Azithromycin- take 2 tablets on day 1, and then one tablet every day for 4 more days.   Let us know if you do not feel better.   Please bring your medicines with you each time you come to clinic.  Medicines may include prescription medications, over-the-counter medications, herbal remedies, eye drops, vitamins, or other pills.

## 2014-12-15 NOTE — Progress Notes (Addendum)
Patient ID: [name] Acosta, male   DOB: [birth year], 54 y.o.   MRN: [id number]   Subjective:   Patient ID: [name] Acosta male   DOB: 12/01/1960 54 y.o.   MRN: [id number]  HPI: [name] Acosta is a 54 y.o. with PMH listed below presented today with c/o cough of 2 weeks duration, productive of greenish yellow sputum- moderate amount,  Has been taking robitussin, which helped somewhat. Last checked his temperature today before coming to the clinic and it was 100.3, said he has been having high tempratures over the past 2-3 weeks, intermittently. SOB has increased past baseline as he is a smoker. Doesn't think he has been wheezing.  Mother who patient lives with has Pneumonia, though patients symptoms started after. Pt got the flu shot this year.  Past Medical History  Diagnosis Date  . Hyperlipidemia   . Hypertension   . Renal calculus   . Prostate disorder    Current Outpatient Prescriptions  Medication Sig Dispense Refill  . aspirin 81 MG tablet Take 81 mg by mouth daily.    Marland Kitchen doxazosin (CARDURA) 4 MG tablet TAKE 1 TABLET BY MOUTH TWICE A DAY 180 tablet 0  . lisinopril (PRINIVIL,ZESTRIL) 20 MG tablet Take 1 tablet (20 mg total) by mouth daily. 90 tablet 4  . omeprazole (PRILOSEC) 20 MG capsule TAKE ONE CAPSULE BY MOUTH EVERY DAY 90 capsule 0  . pravastatin (PRAVACHOL) 40 MG tablet Take 1 tablet (40 mg total) by mouth at bedtime. 90 tablet 3   No current facility-administered medications for this visit.   No family history on file. History   Social History  . Marital Status: Legally Separated    Spouse Name: N/A    Number of Children: N/A  . Years of Education: N/A   Social History Main Topics  . Smoking status: Current Every Day Smoker -- 2.00 packs/day for 40 years    Types: Cigarettes  . Smokeless tobacco: Not on file     Comment: DOWN TO 1.5 PER DAY  . Alcohol Use: No     Comment: Quit drinking in 1988  . Drug Use: Not on file  . Sexual Activity: Not on file   Other  Topics Concern  . Not on file   Social History Narrative  . No narrative on file   Review of Systems: CONSTITUTIONAL- No Fever, weightloss, night sweat or change in appetite. SKIN- No Rash, colour changes or itching. HEAD- No Headache or dizziness. EYES- No Vision loss, pain, redness, double or blurred vision. EARS- No vertigo, hearing loss or ear discharge. Mouth/throat- No Sorethroat, dentures, or bleeding gums. RESPIRATORY- No Cough or SOB. CARDIAC- No Palpitations, DOE, PND or chest pain. GI- Says he has chronic  diarrhea and normal takes 2 Imodium a day, this has ben going on for 10-12 years, no constipation, abd pain. URINARY- No Frequency, urgency, straining or dysuria. NEUROLOGIC- No Numbness, syncope, seizures or burning. [facility]- Denies depression or anxiety.  Objective:  Physical Exam: Filed Vitals:   12/15/14 1418  BP: 155/84  Pulse: 92  Temp: 97.8 F (36.6 C)  TempSrc: Oral  Height: 5\' 9"  (1.753 m)  Weight: 229 lb 11.2 oz (104.191 kg)  SpO2: 100%   GENERAL- alert, co-operative, appears as stated age, not in any distress. HEENT- Atraumatic, normocephalic, PERRL, EOMI, oral mucosa appears moist, mild cervical ademopathy on the right, with erythematous pharynx and tonsils, but without exudates, neck supple. CARDIAC- RRR, no murmurs, rubs or gallops. RESP- Not moving a  lot of air bilaterally ABDOMEN- Soft, nontender, no guarding or rebound, no palpable masses or organomegaly, bowel sounds present. BACK- Normal curvature of the spine, No tenderness along the vertebrae, no CVA tenderness. NEURO- No obvious Cr N abnormality, strenght upper and lower extremities- 5/5, Sensation intact- globally, DTRs- Normal, finger to nose test normal bilat, rapid alternating movement- intact, Gait- Normal. EXTREMITIES- pulse 2+, symmetric, no pedal edema. SKIN- Warm, dry, No rash or lesion. PSYCH- Normal mood and affect, appropriate thought content and speech.  Assessment & Plan:    The patient's case and plan of care was discussed with attending physician, Dr. Daryll Drown.   Please see problem based charting for assessment and plan.

## 2014-12-16 ENCOUNTER — Other Ambulatory Visit: Payer: Self-pay | Admitting: Internal Medicine

## 2014-12-16 LAB — RAPID STREP SCREEN (MED CTR MEBANE ONLY): Streptococcus, Group A Screen (Direct): POSITIVE — AB

## 2014-12-16 NOTE — Addendum Note (Signed)
Addended by: Bethena Roys on: 12/16/2014 09:12 AM   Modules accepted: Level of Service

## 2014-12-16 NOTE — Progress Notes (Signed)
Internal Medicine Clinic Attending  Case discussed with Dr. Emokpae soon after the resident saw the patient.  We reviewed the resident's history and exam and pertinent patient test results.  I agree with the assessment, diagnosis, and plan of care documented in the resident's note. 

## 2014-12-17 MED ORDER — DOXAZOSIN MESYLATE 4 MG PO TABS: 4.0000 mg | ORAL_TABLET | Freq: Two times a day (BID) | ORAL | Status: DC

## 2014-12-17 MED ORDER — LISINOPRIL 20 MG PO TABS: 20.0000 mg | ORAL_TABLET | Freq: Every day | ORAL | Status: DC

## 2014-12-17 NOTE — Addendum Note (Signed)
Addended by: Jenetta Downer E on: 12/17/2014 12:00 PM   Modules accepted: Orders

## 2015-02-05 ENCOUNTER — Other Ambulatory Visit: Payer: Self-pay | Admitting: Internal Medicine

## 2015-03-27 ENCOUNTER — Other Ambulatory Visit: Payer: Self-pay | Admitting: Internal Medicine

## 2015-06-09 ENCOUNTER — Other Ambulatory Visit: Payer: Self-pay | Admitting: Internal Medicine

## 2015-08-04 ENCOUNTER — Encounter: Payer: Self-pay | Admitting: Internal Medicine

## 2015-08-15 ENCOUNTER — Encounter: Payer: Self-pay | Admitting: Internal Medicine

## 2015-08-15 ENCOUNTER — Ambulatory Visit (INDEPENDENT_AMBULATORY_CARE_PROVIDER_SITE_OTHER): Payer: Self-pay | Admitting: Internal Medicine

## 2015-08-15 VITALS — BP 154/82 | HR 80 | Temp 97.2°F | Ht 69.0 in | Wt 224.2 lb

## 2015-08-15 DIAGNOSIS — L03116 Cellulitis of left lower limb: Secondary | ICD-10-CM | POA: Insufficient documentation

## 2015-08-15 DIAGNOSIS — R600 Localized edema: Secondary | ICD-10-CM

## 2015-08-15 DIAGNOSIS — R7401 Elevation of levels of liver transaminase levels: Secondary | ICD-10-CM

## 2015-08-15 DIAGNOSIS — Z0000 Encounter for general adult medical examination without abnormal findings: Secondary | ICD-10-CM

## 2015-08-15 DIAGNOSIS — R740 Nonspecific elevation of levels of transaminase and lactic acid dehydrogenase [LDH]: Secondary | ICD-10-CM

## 2015-08-15 DIAGNOSIS — I10 Essential (primary) hypertension: Secondary | ICD-10-CM

## 2015-08-15 LAB — GLUCOSE, CAPILLARY: Glucose-Capillary: 163 mg/dL — ABNORMAL HIGH (ref 65–99)

## 2015-08-15 LAB — POCT GLYCOSYLATED HEMOGLOBIN (HGB A1C): Hemoglobin A1C: 6.8

## 2015-08-15 MED ORDER — DOXYCYCLINE HYCLATE 100 MG PO CAPS: 100.0000 mg | ORAL_CAPSULE | Freq: Two times a day (BID) | ORAL | Status: DC

## 2015-08-15 NOTE — Patient Instructions (Signed)
Thank you for your visit today.   Please return to the internal medicine clinic in 1 week or sooner if needed.     I have made the following additions/changes to your medications:  Please take doxycycline 100mg  twice daily for 5 days.  You should also make sure to elevated your legs as much as possible. Additionally, I will obtain a lower extremity venous flow of your left leg to make sure you do not have a blood clot.   Please be sure to bring all of your medications with you to every visit; this includes herbal supplements, vitamins, eye drops, and any over-the-counter medications.   Should you have any questions regarding your medications and/or any new or worsening symptoms, please be sure to call the clinic at [phone].   If you believe that you are suffering from a life threatening condition or one that may result in the loss of limb or function, then you should call 911 and proceed to the nearest Emergency Department.  Cellulitis Cellulitis is an infection of the skin and the tissue under the skin. The infected area is usually red and tender. This happens most often in the arms and lower legs. HOME CARE   Take your antibiotic medicine as told. Finish the medicine even if you start to feel better.  Keep the infected arm or leg raised (elevated).  Put a warm cloth on the area up to 4 times per day.  Only take medicines as told by your doctor.  Keep all doctor visits as told. GET HELP IF:  You see red streaks on the skin coming from the infected area.  Your red area gets bigger or turns a dark color.  Your bone or joint under the infected area is painful after the skin heals.  Your infection comes back in the same area or different area.  You have a puffy (swollen) bump in the infected area.  You have new symptoms.  You have a fever. GET HELP RIGHT AWAY IF:   You feel very sleepy.  You throw up (vomit) or have watery poop (diarrhea).  You feel sick and have  muscle aches and pains. MAKE SURE YOU:   Understand these instructions.  Will watch your condition.  Will get help right away if you are not doing well or get worse. Document Released: 06/04/2008 Document Revised: 05/03/2014 Document Reviewed: 03/03/2012 [facility] Patient Information 2015 [place], [place]. This information is not intended to replace advice given to you by your health care provider. Make sure you discuss any questions you have with your health care provider.

## 2015-08-15 NOTE — Assessment & Plan Note (Signed)
-  recheck CMP today

## 2015-08-15 NOTE — Assessment & Plan Note (Addendum)
Started swelling 1 month ago in the LLE with worsening now.  Endorses pruritis, erythema, warmth, and tenderness.  Reports subjective fever/chills.  Denies any known insect bites but has multiple excoriations.  No known h/o DM or DVT. Appears to be cellulitis but cannot r/o LLE DVT. -LLE doppler -doxycycline bid x 5 days (allergic to PCN but does not know reaction) -f/u in 1 week  -reminded to keep elevated as much as possible

## 2015-08-15 NOTE — Progress Notes (Signed)
Patient ID: [name] Acosta, male   DOB: [birth year], 55 y.o.   MRN: [id number]     Subjective:   Patient ID: [name] Acosta male    DOB: 1960-10-27 55 y.o.    MRN: [id number] Health Maintenance Due: Health Maintenance Due  Topic Date Due  . INFLUENZA VACCINE  08/01/2015    _________________________________________________  HPI: [name] Acosta is a 55 y.o. male here for an acute visit for LLE rash and swelling.  Pt has a PMH outlined below.  Please see problem-based charting assessment and plan for further details of medical issues addressed at today's visit.  PMH: Past Medical History  Diagnosis Date  . Hyperlipidemia   . Hypertension   . Renal calculus   . Prostate disorder     Medications: Current Outpatient Prescriptions on File Prior to Visit  Medication Sig Dispense Refill  . albuterol (PROVENTIL HFA;VENTOLIN HFA) 108 (90 BASE) MCG/ACT inhaler Inhale 2 puffs into the lungs every 6 (six) hours as needed for wheezing or shortness of breath. 1 Inhaler 0  . aspirin 81 MG tablet Take 81 mg by mouth daily.    Marland Kitchen doxazosin (CARDURA) 4 MG tablet TAKE 1 TABLET BY MOUTH TWICE A DAY 180 tablet 0  . lisinopril (PRINIVIL,ZESTRIL) 20 MG tablet Take 1 tablet (20 mg total) by mouth daily. 90 tablet 4  . omeprazole (PRILOSEC) 20 MG capsule Take 1 capsule (20 mg total) by mouth daily as needed. 90 capsule 0  . pravastatin (PRAVACHOL) 40 MG tablet TAKE 1 TABLET BY MOUTH EVERY DAY AT BEDTIME 90 tablet 0   No current facility-administered medications on file prior to visit.    Allergies: Allergies  Allergen Reactions  . Codeine     REACTION: Unknown reaction  . Penicillins     Hives and Swelling.    FH: No family history on file.  SH: Social History   Social History  . Marital Status: Legally Separated    Spouse Name: N/A  . Number of Children: N/A  . Years of Education: N/A   Social History Main Topics  . Smoking status: Current Every Day Smoker -- 2.00 packs/day for  40 years    Types: Cigarettes  . Smokeless tobacco: Not on file     Comment: DOWN TO 1.5 PER DAY  . Alcohol Use: No     Comment: Quit drinking in 1988  . Drug Use: Not on file  . Sexual Activity: Not on file   Other Topics Concern  . Not on file   Social History Narrative    Review of Systems: Constitutional: +fever, chills and neg weight loss.  Eyes: Negative for blurred vision.  Respiratory: Negative for cough and +occasional shortness of breath.  Cardiovascular: Negative for chest pain, palpitations and +leg swelling.  Gastrointestinal: Negative for nausea, vomiting, abdominal pain, diarrhea, constipation and blood in stool.  Genitourinary: Negative for dysuria, urgency and frequency.  Musculoskeletal: Negative for myalgias and back pain.  Neurological: Negative for dizziness, weakness and headaches.  Skin: +LLE Rash     Objective:   Vital Signs: Filed Vitals:   08/15/15 1607  BP: 154/82  Pulse: 80  Temp: 97.2 F (36.2 C)  TempSrc: Oral  Height: 5\' 9"  (1.753 m)  Weight: 224 lb 3.2 oz (101.696 kg)  SpO2: 99%      BP Readings from Last 3 Encounters:  08/15/15 154/82  12/15/14 155/84  10/18/14 114/74    Physical Exam: Constitutional: Vital signs reviewed.  Patient is in NAD  and cooperative with exam.  Head: Normocephalic and atraumatic. Eyes: EOMI, conjunctivae nl, no scleral icterus.  Neck: Supple. Cardiovascular: RRR, no MRG. Pulmonary/Chest: normal effort, CTAB, no wheezes, rales, or rhonchi. Abdominal: Soft. NT/ND +BS. Neurological: A&O x3, cranial nerves II-XII are grossly intact, moving all extremities. Extremities: 2+DP RLE, unable to detect DP pulse d/t edema on the left. LLE: Multiple excoriations, erythema, warmth, tender to palpation without drainage.   Skin: Warm, dry and intact.      Assessment & Plan:   Assessment and plan was discussed and formulated with my attending.

## 2015-08-16 ENCOUNTER — Encounter (HOSPITAL_COMMUNITY): Payer: Self-pay

## 2015-08-16 ENCOUNTER — Encounter: Payer: Self-pay | Admitting: Internal Medicine

## 2015-08-16 LAB — CBC WITH DIFFERENTIAL/PLATELET
BASOS ABS: 0.1 10*3/uL (ref 0.0–0.2)
Basos: 1 %
EOS (ABSOLUTE): 0.2 10*3/uL (ref 0.0–0.4)
Eos: 3 %
Hematocrit: 41.8 % (ref 37.5–51.0)
Hemoglobin: 14.3 g/dL (ref 12.6–17.7)
IMMATURE GRANS (ABS): 0 10*3/uL (ref 0.0–0.1)
Immature Granulocytes: 0 %
LYMPHS: 29 %
Lymphocytes Absolute: 2.4 10*3/uL (ref 0.7–3.1)
MCH: 32.4 pg (ref 26.6–33.0)
MCHC: 34.2 g/dL (ref 31.5–35.7)
MCV: 95 fL (ref 79–97)
MONOCYTES: 7 %
Monocytes Absolute: 0.6 10*3/uL (ref 0.1–0.9)
NEUTROS ABS: 5.1 10*3/uL (ref 1.4–7.0)
NEUTROS PCT: 60 %
PLATELETS: 235 10*3/uL (ref 150–379)
RBC: 4.41 x10E6/uL (ref 4.14–5.80)
RDW: 13.1 % (ref 12.3–15.4)
WBC: 8.5 10*3/uL (ref 3.4–10.8)

## 2015-08-16 LAB — COMPREHENSIVE METABOLIC PANEL
ALBUMIN: 4.4 g/dL (ref 3.5–5.5)
ALK PHOS: 88 IU/L (ref 39–117)
ALT: 39 IU/L (ref 0–44)
AST: 24 IU/L (ref 0–40)
Albumin/Globulin Ratio: 1.8 (ref 1.1–2.5)
BILIRUBIN TOTAL: 0.3 mg/dL (ref 0.0–1.2)
BUN / CREAT RATIO: 13 (ref 9–20)
BUN: 13 mg/dL (ref 6–24)
CHLORIDE: 101 mmol/L (ref 97–108)
CO2: 26 mmol/L (ref 18–29)
Calcium: 9.3 mg/dL (ref 8.7–10.2)
Creatinine, Ser: 0.99 mg/dL (ref 0.76–1.27)
GFR calc Af Amer: 99 mL/min/{1.73_m2} (ref >59)
GFR calc non Af Amer: 86 mL/min/{1.73_m2} (ref >59)
GLUCOSE: 157 mg/dL — AB (ref 65–99)
Globulin, Total: 2.4 g/dL (ref 1.5–4.5)
Potassium: 4.4 mmol/L (ref 3.5–5.2)
Sodium: 144 mmol/L (ref 134–144)
Total Protein: 6.8 g/dL (ref 6.0–8.5)

## 2015-08-16 NOTE — Assessment & Plan Note (Signed)
-  check HA1c today

## 2015-08-16 NOTE — Assessment & Plan Note (Signed)
BP mildly elevated today.  Reports compliance with meds. -will need to consider increasing lisinopril at next OV

## 2015-08-17 ENCOUNTER — Ambulatory Visit (HOSPITAL_COMMUNITY)
Admission: RE | Admit: 2015-08-17 | Discharge: 2015-08-17 | Disposition: A | Discharge status: Home / Self Care | Payer: Self-pay | Source: Ambulatory Visit | Attending: Student in an Organized Health Care Education/Training Program | Admitting: Student in an Organized Health Care Education/Training Program

## 2015-08-17 ENCOUNTER — Encounter: Payer: Self-pay | Admitting: Internal Medicine

## 2015-08-17 DIAGNOSIS — R600 Localized edema: Secondary | ICD-10-CM

## 2015-08-17 DIAGNOSIS — M79662 Pain in left lower leg: Secondary | ICD-10-CM | POA: Insufficient documentation

## 2015-08-17 DIAGNOSIS — E119 Type 2 diabetes mellitus without complications: Secondary | ICD-10-CM | POA: Insufficient documentation

## 2015-08-17 DIAGNOSIS — M7989 Other specified soft tissue disorders: Secondary | ICD-10-CM | POA: Insufficient documentation

## 2015-08-17 NOTE — Progress Notes (Signed)
Preliminary results by tech - Left Lower Ext. Venous Duplex Completed. Negative for deep and superficial vein thrombosis in the left lower extremity. Reyne Falconi, BS, RDMS, RVT  

## 2015-08-18 NOTE — Progress Notes (Signed)
Internal Medicine Clinic Attending  Case discussed with Dr. Gill soon after the resident saw the patient.  We reviewed the resident's history and exam and pertinent patient test results.  I agree with the assessment, diagnosis, and plan of care documented in the resident's note.  

## 2015-08-22 ENCOUNTER — Ambulatory Visit (INDEPENDENT_AMBULATORY_CARE_PROVIDER_SITE_OTHER): Payer: Self-pay | Admitting: Internal Medicine

## 2015-08-22 ENCOUNTER — Encounter: Payer: Self-pay | Admitting: Internal Medicine

## 2015-08-22 VITALS — BP 105/75 | HR 100 | Temp 97.5°F | Ht 69.0 in | Wt 220.2 lb

## 2015-08-22 DIAGNOSIS — E785 Hyperlipidemia, unspecified: Secondary | ICD-10-CM

## 2015-08-22 DIAGNOSIS — E119 Type 2 diabetes mellitus without complications: Secondary | ICD-10-CM

## 2015-08-22 DIAGNOSIS — L03115 Cellulitis of right lower limb: Secondary | ICD-10-CM

## 2015-08-22 DIAGNOSIS — I10 Essential (primary) hypertension: Secondary | ICD-10-CM

## 2015-08-22 DIAGNOSIS — R600 Localized edema: Secondary | ICD-10-CM

## 2015-08-22 DIAGNOSIS — Z23 Encounter for immunization: Secondary | ICD-10-CM

## 2015-08-22 DIAGNOSIS — F17210 Nicotine dependence, cigarettes, uncomplicated: Secondary | ICD-10-CM

## 2015-08-22 DIAGNOSIS — F17200 Nicotine dependence, unspecified, uncomplicated: Secondary | ICD-10-CM

## 2015-08-22 DIAGNOSIS — B9689 Other specified bacterial agents as the cause of diseases classified elsewhere: Secondary | ICD-10-CM

## 2015-08-22 DIAGNOSIS — M79662 Pain in left lower leg: Secondary | ICD-10-CM

## 2015-08-22 DIAGNOSIS — L03116 Cellulitis of left lower limb: Secondary | ICD-10-CM

## 2015-08-22 LAB — BRAIN NATRIURETIC PEPTIDE: B Natriuretic Peptide: 9 pg/mL (ref 0.0–100.0)

## 2015-08-22 LAB — HM DIABETES EYE EXAM

## 2015-08-22 MED ORDER — LISINOPRIL 10 MG PO TABS: 10.0000 mg | ORAL_TABLET | Freq: Every day | ORAL | Status: DC

## 2015-08-22 NOTE — Progress Notes (Signed)
Patient ID: DONAVAN KERLIN, male   DOB: 1960/03/01, 55 y.o.   MRN: [id number]     Subjective:   Patient ID: REQUAN HARDGE male    DOB: 08/22/1960 55 y.o.    MRN: [id number] Health Maintenance Due: Health Maintenance Due  Topic Date Due  . FOOT EXAM  11/19/1970  . OPHTHALMOLOGY EXAM  11/19/1970  . INFLUENZA VACCINE  08/01/2015    _________________________________________________  HPI: Mr.Jayceon KAIYDEN SIMKIN is a 55 y.o. male here for a f/u visit.  Pt has a PMH outlined below.  Please see problem-based charting assessment and plan for further details of medical issues addressed at today's visit.  PMH: Past Medical History  Diagnosis Date  . Hyperlipidemia   . Hypertension   . Renal calculus   . Prostate disorder     Medications: Current Outpatient Prescriptions on File Prior to Visit  Medication Sig Dispense Refill  . albuterol (PROVENTIL HFA;VENTOLIN HFA) 108 (90 BASE) MCG/ACT inhaler Inhale 2 puffs into the lungs every 6 (six) hours as needed for wheezing or shortness of breath. 1 Inhaler 0  . aspirin 81 MG tablet Take 81 mg by mouth daily.    Marland Kitchen doxazosin (CARDURA) 4 MG tablet TAKE 1 TABLET BY MOUTH TWICE A DAY 180 tablet 0  . doxycycline (VIBRAMYCIN) 100 MG capsule Take 1 capsule (100 mg total) by mouth 2 (two) times daily. for 5 days 10 capsule 0  . omeprazole (PRILOSEC) 20 MG capsule Take 1 capsule (20 mg total) by mouth daily as needed. 90 capsule 0  . pravastatin (PRAVACHOL) 40 MG tablet TAKE 1 TABLET BY MOUTH EVERY DAY AT BEDTIME 90 tablet 0   No current facility-administered medications on file prior to visit.    Allergies: Allergies  Allergen Reactions  . Codeine     REACTION: Unknown reaction  . Penicillins     Hives and Swelling.    FH: No family history on file.  SH: Social History   Social History  . Marital Status: Legally Separated    Spouse Name: N/A  . Number of Children: N/A  . Years of Education: N/A   Social History Main Topics  .  Smoking status: Current Every Day Smoker -- 2.00 packs/day for 40 years    Types: Cigarettes  . Smokeless tobacco: None     Comment: DOWN TO 1.5 PER DAY  . Alcohol Use: No     Comment: Quit drinking in 1988  . Drug Use: None  . Sexual Activity: Not Asked   Other Topics Concern  . None   Social History Narrative    Review of Systems: Constitutional: Negative for fever, chills and weight loss.  Eyes: Negative for blurred vision.  Respiratory: Negative for cough and shortness of breath.  Cardiovascular: Negative for chest pain, palpitations and leg swelling.  Gastrointestinal: Negative for nausea, vomiting, abdominal pain, diarrhea, constipation and blood in stool.  Genitourinary: Negative for dysuria, urgency and frequency.  Musculoskeletal: Negative for myalgias and back pain.  Neurological: Negative for dizziness, weakness and headaches.     Objective:   Vital Signs: Filed Vitals:   08/22/15 1350  BP: 105/75  Pulse: 100  Temp: 97.5 F (36.4 C)  TempSrc: Oral  Height: 5\' 9"  (1.753 m)  Weight: 220 lb 3.2 oz (99.882 kg)  SpO2: 96%      BP Readings from Last 3 Encounters:  08/22/15 105/75  08/15/15 154/82  12/15/14 155/84    Physical Exam: Constitutional: Vital signs reviewed.  Patient is  in NAD and cooperative with exam.  Head: Normocephalic and atraumatic. Eyes: EOMI, conjunctivae nl, no scleral icterus.  Neck: Supple. Cardiovascular: RRR, no MRG. Pulmonary/Chest: normal effort, CTAB, no wheezes, rales, or rhonchi. Abdominal: Soft. NT/ND +BS. Neurological: A&O x3, cranial nerves II-XII are grossly intact, moving all extremities. Extremities: LLE cellulitis/swelling improved, but still with excoriations and 1+ pitting edema to knee.  RLE with 1+ pitting edema to knee.  LE warm and dry without drainage.  Skin: Warm, dry.   Assessment & Plan:   Assessment and plan was discussed and formulated with my attending.

## 2015-08-22 NOTE — Assessment & Plan Note (Addendum)
Improved, has been taking doxycycline and hasn't missed any doses.  Still c/o LLE pain that is constant, describes it as dull, achy pain that comes below the knee.  -advised to keep feet elevated as much as possible -will order ABIs -advised smoking cessation  -obtain proBNP

## 2015-08-22 NOTE — Assessment & Plan Note (Addendum)
BP today 105/75.  Does report some lightheadedness so will decrease lisinopril dose to 10mg .  Also takes doxazosin that was prescribed previously which is known to cause orthostatic hypotension however he reports he has been on this a long time and has experienced no problems thus far.  Takes doxazosin also d/t BPH.  -cont current meds, but decrease lisinopril to 10mg  daily

## 2015-08-22 NOTE — Assessment & Plan Note (Signed)
-  check lipid panel -cont pravastatin

## 2015-08-22 NOTE — Patient Instructions (Signed)
Thank you for your visit today.   Please return to the internal medicine clinic in 3 month(s) or sooner if needed.     I have made the following additions/changes to your medications: Decrease lisinopril to 10mg  daily.  I will also order an ABI to check the blood flow in your legs.    Please elevate your legs as much as possible as this will help with leg swelling.   Please be sure to bring all of your medications with you to every visit; this includes herbal supplements, vitamins, eye drops, and any over-the-counter medications.   Should you have any questions regarding your medications and/or any new or worsening symptoms, please be sure to call the clinic at [phone].   If you believe that you are suffering from a life threatening condition or one that may result in the loss of limb or function, then you should call 911 and proceed to the nearest Emergency Department.  Diabetes Mellitus and Food It is important for you to manage your blood sugar (glucose) level. Your blood glucose level can be greatly affected by what you eat. Eating healthier foods in the appropriate amounts throughout the day at about the same time each day will help you control your blood glucose level. It can also help slow or prevent worsening of your diabetes mellitus. Healthy eating may even help you improve the level of your blood pressure and reach or maintain a healthy weight.  HOW CAN FOOD AFFECT ME? Carbohydrates Carbohydrates affect your blood glucose level more than any other type of food. Your dietitian will help you determine how many carbohydrates to eat at each meal and teach you how to count carbohydrates. Counting carbohydrates is important to keep your blood glucose at a healthy level, especially if you are using insulin or taking certain medicines for diabetes mellitus. Alcohol Alcohol can cause sudden decreases in blood glucose (hypoglycemia), especially if you use insulin or take certain  medicines for diabetes mellitus. Hypoglycemia can be a life-threatening condition. Symptoms of hypoglycemia (sleepiness, dizziness, and disorientation) are similar to symptoms of having too much alcohol.  If your health care provider has given you approval to drink alcohol, do so in moderation and use the following guidelines:  Women should not have more than one drink per day, and men should not have more than two drinks per day. One drink is equal to:  12 oz of beer.  5 oz of wine.  1 oz of hard liquor.  Do not drink on an empty stomach.  Keep yourself hydrated. Have water, diet soda, or unsweetened iced tea.  Regular soda, juice, and other mixers might contain a lot of carbohydrates and should be counted. WHAT FOODS ARE NOT RECOMMENDED? As you make food choices, it is important to remember that all foods are not the same. Some foods have fewer nutrients per serving than other foods, even though they might have the same number of calories or carbohydrates. It is difficult to get your body what it needs when you eat foods with fewer nutrients. Examples of foods that you should avoid that are high in calories and carbohydrates but low in nutrients include:  Trans fats (most processed foods list trans fats on the Nutrition Facts label).  Regular soda.  Juice.  Candy.  Sweets, such as cake, pie, doughnuts, and cookies.  Fried foods. WHAT FOODS CAN I EAT? Have nutrient-rich foods, which will nourish your body and keep you healthy. The food you should eat also will  depend on several factors, including:  The calories you need.  The medicines you take.  Your weight.  Your blood glucose level.  Your blood pressure level.  Your cholesterol level. You also should eat a variety of foods, including:  Protein, such as meat, poultry, fish, tofu, nuts, and seeds (lean animal proteins are best).  Fruits.  Vegetables.  Dairy products, such as milk, cheese, and yogurt (low fat is  best).  Breads, grains, pasta, cereal, rice, and beans.  Fats such as olive oil, trans fat-free margarine, canola oil, avocado, and olives. DOES EVERYONE WITH DIABETES MELLITUS HAVE THE SAME MEAL PLAN? Because every person with diabetes mellitus is different, there is not one meal plan that works for everyone. It is very important that you meet with a dietitian who will help you create a meal plan that is just right for you. Document Released: 09/13/2005 Document Revised: 12/22/2013 Document Reviewed: 11/13/2013 [facility] Patient Information 2015 [place], [place]. This information is not intended to replace advice given to you by your health care provider. Make sure you discuss any questions you have with your health care provider.

## 2015-08-22 NOTE — Assessment & Plan Note (Addendum)
Still smoking about 1 ppd and reports he has cut back a lot.   -advised smoking cessation

## 2015-08-23 LAB — LIPID PANEL
CHOLESTEROL TOTAL: 149 mg/dL (ref 100–199)
Chol/HDL Ratio: 4.4 ratio units (ref 0.0–5.0)
HDL: 34 mg/dL — ABNORMAL LOW (ref >39)
LDL Calculated: 83 mg/dL (ref 0–99)
TRIGLYCERIDES: 159 mg/dL — AB (ref 0–149)
VLDL CHOLESTEROL CAL: 32 mg/dL (ref 5–40)

## 2015-08-23 LAB — MICROALBUMIN / CREATININE URINE RATIO
Creatinine, Urine: 187 mg/dL
MICROALB/CREAT RATIO: 29 mg/g{creat} (ref 0.0–30.0)
MICROALBUM., U, RANDOM: 54.3 ug/mL

## 2015-08-23 LAB — BASIC METABOLIC PANEL
BUN/Creatinine Ratio: 20 (ref 9–20)
BUN: 17 mg/dL (ref 6–24)
CALCIUM: 8.8 mg/dL (ref 8.7–10.2)
CHLORIDE: 101 mmol/L (ref 97–108)
CO2: 26 mmol/L (ref 18–29)
Creatinine, Ser: 0.84 mg/dL (ref 0.76–1.27)
GFR calc Af Amer: 115 mL/min/{1.73_m2} (ref >59)
GFR calc non Af Amer: 99 mL/min/{1.73_m2} (ref >59)
Glucose: 120 mg/dL — ABNORMAL HIGH (ref 65–99)
POTASSIUM: 4.1 mmol/L (ref 3.5–5.2)
Sodium: 142 mmol/L (ref 134–144)

## 2015-08-26 ENCOUNTER — Encounter: Payer: Self-pay | Admitting: Dietician

## 2015-08-26 NOTE — Progress Notes (Signed)
Internal Medicine Clinic Attending  Case discussed with Dr. Gill soon after the resident saw the patient.  We reviewed the resident's history and exam and pertinent patient test results.  I agree with the assessment, diagnosis, and plan of care documented in the resident's note.  

## 2015-08-26 NOTE — Assessment & Plan Note (Addendum)
Pt HA1c is controlled and will not start any meds at this point.  I will give him an opportunity to control his DM with diet and exercise.   -referral to [facility] for diabetes education -f/u in 3 months -retinal scan today, lipid panel, urine microalb -advised smoking cessation

## 2015-09-19 ENCOUNTER — Ambulatory Visit: Payer: Self-pay | Admitting: Dietician

## 2015-09-23 NOTE — Addendum Note (Signed)
Addended by: Hulan Fray on: 09/23/2015 07:49 PM   Modules accepted: Orders

## 2015-10-03 ENCOUNTER — Other Ambulatory Visit: Payer: Self-pay | Admitting: Internal Medicine

## 2015-10-05 ENCOUNTER — Other Ambulatory Visit: Payer: Self-pay | Admitting: Internal Medicine

## 2015-12-07 ENCOUNTER — Other Ambulatory Visit: Payer: Self-pay | Admitting: Internal Medicine

## 2015-12-12 ENCOUNTER — Ambulatory Visit (HOSPITAL_COMMUNITY)
Admission: RE | Admit: 2015-12-12 | Discharge: 2015-12-12 | Disposition: A | Discharge status: Home / Self Care | Payer: Self-pay | Source: Ambulatory Visit | Attending: Internal Medicine | Admitting: Internal Medicine

## 2015-12-12 ENCOUNTER — Ambulatory Visit (INDEPENDENT_AMBULATORY_CARE_PROVIDER_SITE_OTHER): Payer: Self-pay | Admitting: Internal Medicine

## 2015-12-12 ENCOUNTER — Encounter: Payer: Self-pay | Admitting: Internal Medicine

## 2015-12-12 VITALS — BP 133/86 | HR 93 | Temp 97.9°F | Ht 69.0 in | Wt 226.3 lb

## 2015-12-12 DIAGNOSIS — M25551 Pain in right hip: Secondary | ICD-10-CM

## 2015-12-12 DIAGNOSIS — I10 Essential (primary) hypertension: Secondary | ICD-10-CM

## 2015-12-12 DIAGNOSIS — M47896 Other spondylosis, lumbar region: Secondary | ICD-10-CM | POA: Insufficient documentation

## 2015-12-12 DIAGNOSIS — Z0000 Encounter for general adult medical examination without abnormal findings: Secondary | ICD-10-CM

## 2015-12-12 DIAGNOSIS — E119 Type 2 diabetes mellitus without complications: Secondary | ICD-10-CM

## 2015-12-12 DIAGNOSIS — F17200 Nicotine dependence, unspecified, uncomplicated: Secondary | ICD-10-CM

## 2015-12-12 DIAGNOSIS — Z23 Encounter for immunization: Secondary | ICD-10-CM

## 2015-12-12 HISTORY — DX: Pain in right hip: M25.551

## 2015-12-12 LAB — GLUCOSE, CAPILLARY: Glucose-Capillary: 99 mg/dL (ref 65–99)

## 2015-12-12 LAB — POCT GLYCOSYLATED HEMOGLOBIN (HGB A1C): Hemoglobin A1C: 6.3

## 2015-12-12 MED ORDER — TRAMADOL HCL 50 MG PO TABS: 50.0000 mg | ORAL_TABLET | Freq: Three times a day (TID) | ORAL | Status: DC | PRN

## 2015-12-12 NOTE — Assessment & Plan Note (Signed)
-  influenza vaccine today  

## 2015-12-12 NOTE — Progress Notes (Signed)
Patient ID: [name] Acosta, male   DOB: 1960/03/13, 55 y.o.   MRN: [id number]    Subjective:   Patient ID: [name] Acosta male    DOB: 05-16-1960 55 y.o.    MRN: [id number] Health Maintenance Due: Health Maintenance Due  Topic Date Due  . FOOT EXAM  11/19/1970    _________________________________________________  HPI: [name] is a 55 y.o. male here for a routine visit.  Pt has a PMH outlined below.  Please see problem-based charting assessment and plan for further status of patient's chronic medical problems addressed at today's visit.  PMH: Past Medical History  Diagnosis Date  . Hyperlipidemia   . Hypertension   . Renal calculus   . Prostate disorder     Medications: Current Outpatient Prescriptions on File Prior to Visit  Medication Sig Dispense Refill  . albuterol (PROVENTIL HFA;VENTOLIN HFA) 108 (90 BASE) MCG/ACT inhaler Inhale 2 puffs into the lungs every 6 (six) hours as needed for wheezing or shortness of breath. 1 Inhaler 0  . aspirin 81 MG tablet Take 81 mg by mouth daily.    Marland Kitchen doxazosin (CARDURA) 4 MG tablet TAKE 1 TABLET BY MOUTH TWICE A DAY 180 tablet 0  . lisinopril (PRINIVIL,ZESTRIL) 10 MG tablet Take 1 tablet (10 mg total) by mouth daily. 90 tablet 0  . omeprazole (PRILOSEC) 20 MG capsule TAKE ONE CAPSULE BY MOUTH DAILY AS NEEDED 90 capsule 0  . pravastatin (PRAVACHOL) 40 MG tablet TAKE 1 TABLET BY MOUTH EVERY DAY AT BEDTIME 90 tablet 0   No current facility-administered medications on file prior to visit.    Allergies: Allergies  Allergen Reactions  . Codeine     REACTION: Unknown reaction  . Penicillins     Hives and Swelling.    FH: No family history on file.  SH: Social History   Social History  . Marital Status: Legally Separated    Spouse Name: N/A  . Number of Children: N/A  . Years of Education: N/A   Social History Main Topics  . Smoking status: Current Every Day Smoker -- 2.00 packs/day for 40 years    Types:  Cigarettes  . Smokeless tobacco: None     Comment: DOWN TO 1.5 PER DAY  . Alcohol Use: No     Comment: Quit drinking in 1988  . Drug Use: None  . Sexual Activity: Not Asked   Other Topics Concern  . None   Social History Narrative    Review of Systems: Constitutional: Negative for fever, chills and weight loss.  Eyes: Negative for blurred vision.  Respiratory: Negative for cough and shortness of breath.  Cardiovascular: Negative for chest pain, palpitations and leg swelling.  Gastrointestinal: Negative for nausea, vomiting, abdominal pain, diarrhea, constipation and blood in stool.  Genitourinary: Negative for dysuria, urgency and frequency.  Musculoskeletal: Negative for myalgias and back pain, +hip and knee pain  Neurological: Negative for dizziness, weakness and headaches.     Objective:   Vital Signs: Filed Vitals:   12/12/15 1634  BP: 133/86  Pulse: 93  Temp: 97.9 F (36.6 C)  TempSrc: Oral  Height: 5\' 9"  (1.753 m)  Weight: 226 lb 4.8 oz (102.649 kg)  SpO2: 98%     BP Readings from Last 3 Encounters:  12/12/15 133/86  08/22/15 105/75  08/15/15 154/82    Physical Exam: Constitutional: Vital signs reviewed.  Patient is in NAD and cooperative with exam.  Head: Normocephalic and atraumatic. Eyes: EOMI, conjunctivae nl, no scleral  icterus.  Neck: Supple. Cardiovascular: RRR, no MRG. Pulmonary/Chest: normal effort, CTAB, no wheezes, rales, or rhonchi. Abdominal: Soft. NT/ND +BS. Musculoskeletal: R hip passive internal ROM produces pain.  No deformity.  L hip FROM.    Neurological: A&O x3, cranial nerves II-XII are grossly intact, moving all extremities. Extremities: 2+DP b/l; trace pitting edema. Skin: Warm, dry and intact.    Assessment & Plan:   Assessment and plan was discussed and formulated with my attending.

## 2015-12-12 NOTE — Assessment & Plan Note (Addendum)
Pt p/w right frontal hip pain ongoing for 1.5 months.  Not tender to palpation. No deformity or trauma.  No paresthesias or weakness.  Passive internal ROM produces severe pain.  Denies back pain.  Likely OA given also complaining of left knee pain.   -will obtain XR R hip -tramadol 50mg  q 8hr PRN for pain

## 2015-12-12 NOTE — Patient Instructions (Addendum)
Thank you for your visit today.   Please return to the internal medicine clinic in 1 month(s) or sooner if needed.     I have made the following additions/changes to your medications: None, I will prescribe tramadol for your hip pain.  We will also need to get an XR of the R hip.  You likely have arthritis.    Keep up the good work with controlling your blood sugars.    Please be sure to bring all of your medications with you to every visit; this includes herbal supplements, vitamins, eye drops, and any over-the-counter medications.   Should you have any questions regarding your medications and/or any new or worsening symptoms, please be sure to call the clinic at [phone].   If you believe that you are suffering from a life threatening condition or one that may result in the loss of limb or function, then you should call 911 and proceed to the nearest Emergency Department.   A healthy lifestyle and preventative care can promote health and wellness.   Maintain regular health, dental, and eye exams.  Eat a healthy diet. Foods like vegetables, fruits, whole grains, low-fat dairy products, and lean protein foods contain the nutrients you need without too many calories. Decrease your intake of foods high in solid fats, added sugars, and salt. Get information about a proper diet from your caregiver, if necessary.  Regular physical exercise is one of the most important things you can do for your health. Most adults should get at least 150 minutes of moderate-intensity exercise (any activity that increases your heart rate and causes you to sweat) each week. In addition, most adults need muscle-strengthening exercises on 2 or more days a week.   Maintain a healthy weight. The body mass index (BMI) is a screening tool to identify possible weight problems. It provides an estimate of body fat based on height and weight. Your caregiver can help determine your BMI, and can help you achieve or  maintain a healthy weight. For adults 20 years and older:  A BMI below 18.5 is considered underweight.  A BMI of 18.5 to 24.9 is normal.  A BMI of 25 to 29.9 is considered overweight.  A BMI of 30 and above is considered obese.

## 2015-12-13 NOTE — Assessment & Plan Note (Signed)
HA1c today 6.5.  -will hold off on starting metformin  -encouraged weight loss, diet modification, and smoking cessation -cont lisinopril, statin

## 2015-12-13 NOTE — Assessment & Plan Note (Addendum)
Still smoking about 1.5 ppd; does not seem interested in quitting.  -encouraged cessation

## 2015-12-13 NOTE — Assessment & Plan Note (Signed)
BP at goal. -cont current meds

## 2015-12-15 NOTE — Progress Notes (Signed)
Internal Medicine Clinic Attending  Case discussed with Dr. Gill soon after the resident saw the patient.  We reviewed the resident's history and exam and pertinent patient test results.  I agree with the assessment, diagnosis, and plan of care documented in the resident's note.  

## 2016-01-02 ENCOUNTER — Other Ambulatory Visit: Payer: Self-pay | Admitting: Internal Medicine

## 2016-01-23 ENCOUNTER — Telehealth: Payer: Self-pay | Admitting: Internal Medicine

## 2016-01-23 NOTE — Telephone Encounter (Signed)
NEEDS REFILLS ON ALL HIS MEDICATIONS, WANT TO CHANGE TO DIFFERENT PHARMACY

## 2016-01-24 ENCOUNTER — Ambulatory Visit (INDEPENDENT_AMBULATORY_CARE_PROVIDER_SITE_OTHER): Payer: Self-pay | Admitting: Internal Medicine

## 2016-01-24 DIAGNOSIS — M164 Bilateral post-traumatic osteoarthritis of hip: Secondary | ICD-10-CM

## 2016-01-24 DIAGNOSIS — M7989 Other specified soft tissue disorders: Secondary | ICD-10-CM | POA: Insufficient documentation

## 2016-01-24 DIAGNOSIS — M153 Secondary multiple arthritis: Secondary | ICD-10-CM

## 2016-01-24 DIAGNOSIS — M170 Bilateral primary osteoarthritis of knee: Secondary | ICD-10-CM | POA: Insufficient documentation

## 2016-01-24 DIAGNOSIS — M1712 Unilateral primary osteoarthritis, left knee: Secondary | ICD-10-CM

## 2016-01-24 HISTORY — DX: Other specified soft tissue disorders: M79.89

## 2016-01-24 MED ORDER — CYCLOBENZAPRINE HCL 10 MG PO TABS: 10.0000 mg | ORAL_TABLET | Freq: Three times a day (TID) | ORAL | Status: DC | PRN

## 2016-01-24 MED ORDER — LISINOPRIL 10 MG PO TABS: 10.0000 mg | ORAL_TABLET | Freq: Every day | ORAL | Status: DC

## 2016-01-24 MED ORDER — IBUPROFEN 800 MG PO TABS: 800.0000 mg | ORAL_TABLET | Freq: Three times a day (TID) | ORAL | Status: DC | PRN

## 2016-01-24 MED ORDER — OMEPRAZOLE 20 MG PO CPDR: DELAYED_RELEASE_CAPSULE | ORAL | Status: DC

## 2016-01-24 MED ORDER — DOXAZOSIN MESYLATE 4 MG PO TABS: 4.0000 mg | ORAL_TABLET | Freq: Two times a day (BID) | ORAL | Status: DC

## 2016-01-24 MED ORDER — PRAVASTATIN SODIUM 40 MG PO TABS: 40.0000 mg | ORAL_TABLET | Freq: Every day | ORAL | Status: DC

## 2016-01-24 MED ORDER — CYCLOBENZAPRINE HCL 10 MG PO TABS: 10.0000 mg | ORAL_TABLET | Freq: Three times a day (TID) | ORAL | Status: AC | PRN

## 2016-01-24 NOTE — Patient Instructions (Addendum)
General Instructions:  You can take 800mg  of Ibuprofen up to three times a day You can take up to 3,000mg  of tylenol in 1 day.  Do not take extra OTC pain relief medications unless you talk with [place].  I want you to try heat and/or ice to the areas that bother you  Try to start a low impact exercise routine.  Please bring your medicines with you each time you come to clinic.  Medicines may include prescription medications, over-the-counter medications, herbal remedies, eye drops, vitamins, or other pills.   Progress Toward Treatment Goals:  Treatment Goal 10/18/2014  Blood pressure at goal  Stop smoking -    Self Care Goals & Plans:  Self Care Goal 08/15/2015  Manage my medications take my medicines as prescribed; bring my medications to every visit; refill my medications on time  Monitor my health -  Eat healthy foods drink diet soda or water instead of juice or soda; eat more vegetables; eat foods that are low in salt; eat baked foods instead of fried foods; eat fruit for snacks and desserts  Be physically active -  Stop smoking -    No flowsheet data found.   Care Management & Community Referrals:  Referral 12/01/2012  Referrals made for care management support other (see comment)  Referrals made to community resources smoking cessation       Knee Injection A knee injection is a procedure to get medicine into your knee joint. Your health care provider puts a needle into the joint and injects medicine with an attached syringe. The injected medicine may relieve the pain, swelling, and stiffness of arthritis. The injected medicine may also help to lubricate and cushion your knee joint. You may need more than one injection. LET [facility] CARE PROVIDER KNOW ABOUT:  Any allergies you have.  All medicines you are taking, including vitamins, herbs, eye drops, creams, and over-the-counter medicines.  Previous problems you or members of your family have had with the use of  anesthetics.  Any blood disorders you have.  Previous surgeries you have had.  Any medical conditions you may have. RISKS AND COMPLICATIONS Generally, this is a safe procedure. However, problems may occur, including:  Infection.  Bleeding.  Worsening symptoms.  Damage to the area around your knee.  Allergic reaction to any of the medicines.  Skin reactions from repeated injections. BEFORE THE PROCEDURE  Ask your health care provider about changing or stopping your regular medicines. This is especially important if you are taking diabetes medicines or blood thinners.  Plan to have someone take you home after the procedure. PROCEDURE  You will sit or lie down in a position for your knee to be treated.  The skin over your kneecap will be cleaned with a germ-killing solution (antiseptic).  You will be given a medicine that numbs the area (local anesthetic). You may feel some stinging.  After your knee becomes numb, you will have a second injection. This is the medicine. This needle is carefully placed between your kneecap and your knee. The medicine is injected into the joint space.  At the end of the procedure, the needle will be removed.  A bandage (dressing) may be placed over the injection site. The procedure may vary among health care providers and hospitals. AFTER THE PROCEDURE  You may have to move your knee through its full range of motion. This helps to get all of the medicine into your joint space.  Your blood pressure, heart rate, breathing  rate, and blood oxygen level will be monitored often until the medicines you were given have worn off.  You will be watched to make sure that you do not have a reaction to the injected medicine.   This information is not intended to replace advice given to you by your health care provider. Make sure you discuss any questions you have with your health care provider.   Document Released: 03/10/2007 Document Revised:  01/07/2015 Document Reviewed: 10/27/2014 Elsevier Interactive Patient Education [employer].

## 2016-01-24 NOTE — Progress Notes (Signed)
[facility] Subjective:   Patient ID: [name] Acosta male   DOB: 1960/08/14 56 y.o.   MRN: [id number]  HPI: JamesThos ELIA Acosta is a 56 y.o. male with a PMH detailed below who presents for left knee pain.  He reports he has had chronic left knee pain for many years.  He also reports that he has bilateral hip pain, low back pain, neck pain, left ankle pain.  Currently the left knee pain is the worst.  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  He notes that it is hard work and that he has had a number of injuries over the years including falling off a ladder and breaking his left ankle.  He has been taking Tylenol 1-2 tabs three times a day plus 4 OTC ibuprofen (800mg  total) up to three times a day and 2 OTC naproxen one or two time a day.  He has found this regimen elevates some of his chronic joint pain but lately with the cold weather it has not been enough for his knee pain.  He denies any fever or chills, and redness of the knee.  He reports the pain in his knee is sharp, does not radiate, worse after walking/working.  He denies any abdominal pain, blood or melena in stool.  He has had some swelling for the last few months of his left lower leg, he occasionally fells like his left leg is cold.    Past Medical History  Diagnosis Date  . Hyperlipidemia   . Hypertension   . Renal calculus   . Prostate disorder    Current Outpatient Prescriptions  Medication Sig Dispense Refill  . albuterol (PROVENTIL HFA;VENTOLIN HFA) 108 (90 BASE) MCG/ACT inhaler Inhale 2 puffs into the lungs every 6 (six) hours as needed for wheezing or shortness of breath. 1 Inhaler 0  . aspirin 81 MG tablet Take 81 mg by mouth daily.    . cyclobenzaprine (FLEXERIL) 10 MG tablet Take 1 tablet (10 mg total) by mouth every 8 (eight) hours as needed for muscle spasms. 30 tablet 1  . doxazosin (CARDURA) 4 MG tablet Take 1 tablet (4 mg total) by mouth 2 (two) times daily. 180 tablet 1  . ibuprofen  (ADVIL,MOTRIN) 800 MG tablet Take 1 tablet (800 mg total) by mouth every 8 (eight) hours as needed. 90 tablet 2  . lisinopril (PRINIVIL,ZESTRIL) 10 MG tablet Take 1 tablet (10 mg total) by mouth daily. 90 tablet 1  . omeprazole (PRILOSEC) 20 MG capsule TAKE ONE CAPSULE BY MOUTH DAILY AS NEEDED 90 capsule 1  . pravastatin (PRAVACHOL) 40 MG tablet Take 1 tablet (40 mg total) by mouth at bedtime. 90 tablet 1   No current facility-administered medications for this visit.   No family history on file. Social History   Social History  . Marital Status: Legally Separated    Spouse Name: N/A  . Number of Children: N/A  . Years of Education: N/A   Social History Main Topics  . Smoking status: Current Every Day Smoker -- 2.00 packs/day for 40 years    Types: Cigarettes  . Smokeless tobacco: Not on file     Comment: DOWN TO 1.5 PER DAY  . Alcohol Use: No     Comment: Quit drinking in 1988  . Drug Use: Not on file  . Sexual Activity: Not on file   Other Topics Concern  . Not on file   Social History Narrative   Review of  Systems: Review of Systems  Constitutional: Negative for fever and chills.  Eyes: Negative for blurred vision.  Respiratory: Negative for shortness of breath.   Cardiovascular: Negative for chest pain.  Gastrointestinal: Negative for heartburn.  Musculoskeletal: Positive for back pain and joint pain. Negative for falls.  Neurological: Negative for tingling, sensory change and headaches.  All other systems reviewed and are negative.    Objective:  Physical Exam: There were no vitals filed for this visit. Physical Exam  Constitutional: He is well-developed, well-nourished, and in no distress.  Cardiovascular: Normal rate and regular rhythm.   Pulses:      Dorsalis pedis pulses are 1+ on the right side, and 1+ on the left side.       Posterior tibial pulses are 1+ on the right side, and 1+ on the left side.  Cap refill <1sec in great toe bilaterally   Pulmonary/Chest: Effort normal and breath sounds normal.  Abdominal: Soft. Bowel sounds are normal.  Musculoskeletal:       Left knee: He exhibits effusion. He exhibits normal alignment, no LCL laxity, normal meniscus and no MCL laxity. Tenderness found. Medial joint line and lateral joint line tenderness noted.  1+ lower extremity edema of left lower leg  Nursing note and vitals reviewed.   Assessment & Plan:  Case discussed with Dr. Lynnae January  Osteoarthritis of left knee A: OA of left knee  P: - Corticosteroid injection given to left knee. - NSAIDS as stated below.  Post-traumatic osteoarthritis of multiple joints A: Post Traumatic OA of multiple joints  P:  -Recent imaging of his hips shows degenerative changes consistent with OA.  He has found NSAIDS to be helpful but is not appropriately taking these medications. - Asked that he stop taking OTC NSAIDs - Start 800mg  Ibuprofen TIDPRN - Add Flexeril 10mg  Q8PRN for muscle spasm, take for <2 weeks at a time - Add ICE/HEAT as needed - Start stretching program  Left leg swelling A: Left leg swelling  P:  - This has been addressed over the last few visits by his PCP, DVT was ruled out - ABIs have been ordered but no completed, he does have palpable pusles in both feet but diminished,  I asked that he complete the ABIs.    Medications Ordered Meds ordered this encounter  Medications  . DISCONTD: ibuprofen (ADVIL,MOTRIN) 800 MG tablet    Sig: Take 1 tablet (800 mg total) by mouth every 8 (eight) hours as needed.    Dispense:  90 tablet    Refill:  2  . DISCONTD: cyclobenzaprine (FLEXERIL) 10 MG tablet    Sig: Take 1 tablet (10 mg total) by mouth every 8 (eight) hours as needed for muscle spasms.    Dispense:  30 tablet    Refill:  1  . ibuprofen (ADVIL,MOTRIN) 800 MG tablet    Sig: Take 1 tablet (800 mg total) by mouth every 8 (eight) hours as needed.    Dispense:  90 tablet    Refill:  2  . lisinopril  (PRINIVIL,ZESTRIL) 10 MG tablet    Sig: Take 1 tablet (10 mg total) by mouth daily.    Dispense:  90 tablet    Refill:  1  . omeprazole (PRILOSEC) 20 MG capsule    Sig: TAKE ONE CAPSULE BY MOUTH DAILY AS NEEDED    Dispense:  90 capsule    Refill:  1  . pravastatin (PRAVACHOL) 40 MG tablet    Sig: Take 1 tablet (40 mg total)  by mouth at bedtime.    Dispense:  90 tablet    Refill:  1  . doxazosin (CARDURA) 4 MG tablet    Sig: Take 1 tablet (4 mg total) by mouth 2 (two) times daily.    Dispense:  180 tablet    Refill:  1  . cyclobenzaprine (FLEXERIL) 10 MG tablet    Sig: Take 1 tablet (10 mg total) by mouth every 8 (eight) hours as needed for muscle spasms.    Dispense:  30 tablet    Refill:  1   Other Orders No orders of the defined types were placed in this encounter.   Follow Up: Return in about 3 months (around 04/23/2016).

## 2016-01-25 NOTE — Progress Notes (Signed)
Procedure note PROCEDURE NOTE  PROCEDURE: left knee joint steroid injection.  PREOPERATIVE DIAGNOSIS: Osteoarthritis of the left knee.  POSTOPERATIVE DIAGNOSIS: Osteoarthritis of the left knee.  PROCEDURE: The patient was apprised of the risks and the benefits of the procedure and informed consent was obtained, as witnessed by [facility]. Time-out procedure was performed, with confirmation of the patient's name, date of birth, and correct identification of the left knee to be injected. The patient's knee was then marked at the appropriate site for injection placement. The knee was sterilely prepped with Betadine. A 40 mg (1 milliliter) solution of Kenalog was drawn up into a 2.5 mL syringe with a 1.5 mL of 1% lidocaine. The patient was injected with a 22-gauge needle at the medial aspect of his left flexed knee. There were no complications. The patient tolerated the procedure well. There was minimal bleeding. The patient was instructed to ice his knee upon leaving clinic and refrain from overuse over the next 3 days. The patient was instructed to go to the emergency room with any usual pain, swelling, or redness occurred in the injected area. The patient was given a followup appointment to evaluate response to the injection to his increased range of motion and reduction of pain.

## 2016-01-25 NOTE — Assessment & Plan Note (Signed)
A: Post Traumatic OA of multiple joints  P:  -Recent imaging of his hips shows degenerative changes consistent with OA.  He has found NSAIDS to be helpful but is not appropriately taking these medications. - Asked that he stop taking OTC NSAIDs - Start 800mg  Ibuprofen TIDPRN - Add Flexeril 10mg  Q8PRN for muscle spasm, take for <2 weeks at a time - Add ICE/HEAT as needed - Start stretching program

## 2016-01-25 NOTE — Assessment & Plan Note (Signed)
A: OA of left knee  P: - Corticosteroid injection given to left knee. - NSAIDS as stated below.

## 2016-01-25 NOTE — Assessment & Plan Note (Signed)
A: Left leg swelling  P:  - This has been addressed over the last few visits by his PCP, DVT was ruled out - ABIs have been ordered but no completed, he does have palpable pusles in both feet but diminished,  I asked that he complete the ABIs.

## 2016-01-26 NOTE — Progress Notes (Signed)
Internal Medicine Clinic Attending  Case discussed with Dr. Hoffman soon after the resident saw the patient.  We reviewed the resident's history and exam and pertinent patient test results.  I agree with the assessment, diagnosis, and plan of care documented in the resident's note. 

## 2016-02-01 ENCOUNTER — Encounter: Payer: Self-pay | Admitting: Internal Medicine

## 2016-02-13 ENCOUNTER — Encounter: Payer: Self-pay | Admitting: Internal Medicine

## 2016-02-13 ENCOUNTER — Ambulatory Visit (INDEPENDENT_AMBULATORY_CARE_PROVIDER_SITE_OTHER): Payer: Self-pay | Admitting: Internal Medicine

## 2016-02-13 VITALS — BP 141/78 | HR 96 | Temp 97.7°F | Wt 210.8 lb

## 2016-02-13 DIAGNOSIS — F17210 Nicotine dependence, cigarettes, uncomplicated: Secondary | ICD-10-CM

## 2016-02-13 DIAGNOSIS — I10 Essential (primary) hypertension: Secondary | ICD-10-CM

## 2016-02-13 DIAGNOSIS — M170 Bilateral primary osteoarthritis of knee: Secondary | ICD-10-CM

## 2016-02-13 DIAGNOSIS — F17200 Nicotine dependence, unspecified, uncomplicated: Secondary | ICD-10-CM

## 2016-02-13 DIAGNOSIS — E119 Type 2 diabetes mellitus without complications: Secondary | ICD-10-CM

## 2016-02-13 NOTE — Patient Instructions (Signed)
Thank you for your visit today.   Please return to the internal medicine clinic in 2 month(s) or sooner if needed.  Please return if you experience fever/chills, redness, swelling, warmth of the knees.     Congratulations on your weight loss!!  Please keep up the good work.  You may take tylenol up to 3000mg  per day for knee pain.  You may also take ibuprofen 800mg  sparingly if tylenol does not help.  You should also try some aspercreme (lidocaine) cream as this may help to rub on your knees.    Please try to get the orange card so that we can help you with referrals.    Please be sure to bring all of your medications with you to every visit; this includes herbal supplements, vitamins, eye drops, and any over-the-counter medications.   Should you have any questions regarding your medications and/or any new or worsening symptoms, please be sure to call the clinic at [phone].   If you believe that you are suffering from a life threatening condition or one that may result in the loss of limb or function, then you should call 911 and proceed to the nearest Emergency Department.   A healthy lifestyle and preventative care can promote health and wellness.   Maintain regular health, dental, and eye exams.  Eat a healthy diet. Foods like vegetables, fruits, whole grains, low-fat dairy products, and lean protein foods contain the nutrients you need without too many calories. Decrease your intake of foods high in solid fats, added sugars, and salt. Get information about a proper diet from your caregiver, if necessary.  Regular physical exercise is one of the most important things you can do for your health. Most adults should get at least 150 minutes of moderate-intensity exercise (any activity that increases your heart rate and causes you to sweat) each week. In addition, most adults need muscle-strengthening exercises on 2 or more days a week.   Maintain a healthy weight. The body mass  index (BMI) is a screening tool to identify possible weight problems. It provides an estimate of body fat based on height and weight. Your caregiver can help determine your BMI, and can help you achieve or maintain a healthy weight. For adults 20 years and older:  A BMI below 18.5 is considered underweight.  A BMI of 18.5 to 24.9 is normal.  A BMI of 25 to 29.9 is considered overweight.  A BMI of 30 and above is considered obese.  Knee Injection, Care After Refer to this sheet in the next few weeks. These instructions provide you with information about caring for yourself after your procedure. Your health care provider may also give you more specific instructions. Your treatment has been planned according to current medical practices, but problems sometimes occur. Call your health care provider if you have any problems or questions after your procedure. WHAT TO EXPECT AFTER THE PROCEDURE After your procedure, it is common to have:  Soreness.  Warmth.  Swelling. You may have more pain, swelling, and warmth than you did before the injection. This reaction may last for about one day.  HOME CARE INSTRUCTIONS Bathing  If you were given a bandage (dressing), keep it dry until your health care provider says it can be removed. Ask your health care provider when you can start showering or taking a bath. Managing Pain, Stiffness, and Swelling  If directed, apply ice to the injection area:  Put ice in a plastic bag.  Place a towel  between your skin and the bag.  Leave the ice on for 20 minutes, 2-3 times per day.  Do not apply heat to your knee.  Raise the injection area above the level of your heart while you are sitting or lying down. Activity  Avoid strenuous activities for as long as directed by your health care provider. Ask your health care provider when you can return to your normal activities. General Instructions  Take medicines only as directed by your health care  provider.  Do not take aspirin or other over-the-counter medicines unless your health care provider says you can.  Check your injection site every day for signs of infection. Watch for:  Redness, swelling, or pain.  Fluid, blood, or pus.  Follow your health care provider's instructions about dressing changes and removal. SEEK MEDICAL CARE IF:  You have symptoms at your injection site that last longer than two days after your procedure.  You have redness, swelling, or pain in your injection area.  You have fluid, blood, or pus coming from your injection site.  You have warmth in your injection area.  You have a fever.  Your pain is not controlled with medicine. SEEK IMMEDIATE MEDICAL CARE IF:  Your knee turns very red.  Your knee becomes very swollen.  Your knee pain is severe.   This information is not intended to replace advice given to you by your health care provider. Make sure you discuss any questions you have with your health care provider.   Document Released: 01/07/2015 Document Reviewed: 01/07/2015 Elsevier Interactive Patient Education [employer].

## 2016-02-13 NOTE — Assessment & Plan Note (Signed)
Discussed smoking cessation and reiterated the importance of quitting.  He does not seem open or interested in quitting at this point.  Does not appear to be open to smoking cessation aides.  P: -will continue to encourage cessation

## 2016-02-13 NOTE — Assessment & Plan Note (Signed)
A: HTN, stable. P: Cont current meds

## 2016-02-13 NOTE — Progress Notes (Addendum)
Patient ID: [name] Acosta, male   DOB: [birth year], 56 y.o.   MRN: [id number]     Subjective:   Patient ID: [name] Acosta male    DOB: 1960/07/15 56 y.o.    MRN: [id number] Health Maintenance Due: Health Maintenance Due  Topic Date Due  . FOOT EXAM  11/19/1970    _________________________________________________  HPI: [name] Acosta is a 56 y.o. male here for a follow up visit for knee pain.  Pt has a PMH outlined below.  Please see problem-based charting assessment and plan for further status of patient's chronic medical problems addressed at today's visit.  PMH: Past Medical History  Diagnosis Date  . Hyperlipidemia   . Hypertension   . Renal calculus   . Prostate disorder     Medications: Current Outpatient Prescriptions on File Prior to Visit  Medication Sig Dispense Refill  . albuterol (PROVENTIL HFA;VENTOLIN HFA) 108 (90 BASE) MCG/ACT inhaler Inhale 2 puffs into the lungs every 6 (six) hours as needed for wheezing or shortness of breath. 1 Inhaler 0  . aspirin 81 MG tablet Take 81 mg by mouth daily.    . cyclobenzaprine (FLEXERIL) 10 MG tablet Take 1 tablet (10 mg total) by mouth every 8 (eight) hours as needed for muscle spasms. 30 tablet 1  . doxazosin (CARDURA) 4 MG tablet Take 1 tablet (4 mg total) by mouth 2 (two) times daily. 180 tablet 1  . ibuprofen (ADVIL,MOTRIN) 800 MG tablet Take 1 tablet (800 mg total) by mouth every 8 (eight) hours as needed. 90 tablet 2  . lisinopril (PRINIVIL,ZESTRIL) 10 MG tablet Take 1 tablet (10 mg total) by mouth daily. 90 tablet 1  . omeprazole (PRILOSEC) 20 MG capsule TAKE ONE CAPSULE BY MOUTH DAILY AS NEEDED 90 capsule 1  . pravastatin (PRAVACHOL) 40 MG tablet Take 1 tablet (40 mg total) by mouth at bedtime. 90 tablet 1   No current facility-administered medications on file prior to visit.    Allergies: Allergies  Allergen Reactions  . Codeine     REACTION: Unknown reaction  . Penicillins     Hives and Swelling.     FH: No family history on file.  SH: Social History   Social History  . Marital Status: Legally Separated    Spouse Name: N/A  . Number of Children: N/A  . Years of Education: N/A   Social History Main Topics  . Smoking status: Current Every Day Smoker -- 2.00 packs/day for 40 years    Types: Cigarettes  . Smokeless tobacco: Not on file     Comment: DOWN TO 1.5 PER DAY  . Alcohol Use: No     Comment: Quit drinking in 1988  . Drug Use: Not on file  . Sexual Activity: Not on file   Other Topics Concern  . Not on file   Social History Narrative    Review of Systems: Constitutional: Negative for fever, chills and weight loss.  Eyes: Negative for blurred vision.  Respiratory: Negative for cough and shortness of breath.  Cardiovascular: Negative for chest pain, palpitations and leg swelling.  Gastrointestinal: Negative for nausea, vomiting, abdominal pain, diarrhea, constipation and blood in stool.  Genitourinary: Negative for dysuria, urgency and frequency.  Musculoskeletal: Negative for myalgias and +back pain, +hip pain, R and L knee pain.   Neurological: Negative for dizziness, weakness and headaches.     Objective:   Vital Signs: Filed Vitals:   02/13/16 1435  BP: 141/78  Pulse: 96  Temp:  97.7 F (36.5 C)  TempSrc: Oral  Weight: 210 lb 12.8 oz (95.618 kg)  SpO2: 98%      BP Readings from Last 3 Encounters:  02/13/16 141/78  12/12/15 133/86  08/22/15 105/75    Physical Exam: Constitutional: Vital signs reviewed.  Patient is in NAD and cooperative with exam.  Head: Normocephalic and atraumatic. Eyes: EOMI, conjunctivae nl, no scleral icterus.  Neck: Supple. Cardiovascular: RRR, no MRG. Pulmonary/Chest: normal effort, CTAB, no wheezes, rales, or rhonchi. Abdominal: Soft. NT/ND +BS. Musculoskeletal: R and L knee decreased passive/active ROM.  +Crepitus b/l, without erythema, warmth, or deformity. L knee medical point tenderness.  R knee without  tenderness. Neurological: A&O x3, cranial nerves II-XII are grossly intact, moving all extremities. Extremities: 2+DP b/l; 1+ pitting edema b/l Skin: Warm, dry and intact.    Assessment & Plan:   Assessment and plan was discussed and formulated with my attending.

## 2016-02-13 NOTE — Assessment & Plan Note (Addendum)
A: DM controlled without medications P: -repeat HA1c at next OV  -congratulated on weight loss

## 2016-02-13 NOTE — Assessment & Plan Note (Addendum)
Pt reports some improvement in left knee pain but reports starting to come back after injection.  States he is not using flexeril because it makes him drowsy.  However, now c/o R knee pain.  Crepitus on exam.  A: -advised could try topicals likely lidocaine cream  -try tylenol first for pain relief, and then use ibuprofen if needed only -injection of R knee today

## 2016-02-15 ENCOUNTER — Telehealth: Payer: Self-pay | Admitting: Internal Medicine

## 2016-02-15 NOTE — Telephone Encounter (Signed)
Pt advised to go to the ER or urgent care to ensure his airway OK and no fracture to side of face that was effected.  Pt declines and would like to come to clinic.  Scheduled for 315 2/16 with dr. Heber Dunreith

## 2016-02-15 NOTE — Telephone Encounter (Signed)
PT WAS WORKING ON HIS CAR AND SOMETHING CAME OFF CAR AND HIT HIM IN FACE, THINKS HE BROKE HIS NOSE. WHAT SHOULD HE DO

## 2016-02-15 NOTE — Progress Notes (Addendum)
Procedure Note  Procedure diagnosis: Right knee osteoarthritis    After consent was obtained, using sterile technique the right knee was prepped and 1% lidocaine used to anesthetize the needle tract into the joint from the suprapatellar approach. The knee joint was entered and kenalog with lidocaine was then injected and the needle withdrawn.  The procedure was well tolerated.  The patient was advised to watch for fever, or increased swelling or persistent pain in knee. Call or return to clinic prn if such symptoms occur or the knee fails to improve as anticipated.  Michail Jewels, MD

## 2016-02-15 NOTE — Telephone Encounter (Signed)
Last night pt had heavy piece of engine 35lb of truck fall on face, 45 minute nose bleed, swelling has gone down.

## 2016-02-16 ENCOUNTER — Encounter: Payer: Self-pay | Admitting: Internal Medicine

## 2016-02-16 ENCOUNTER — Emergency Department (HOSPITAL_COMMUNITY): Payer: Self-pay

## 2016-02-16 ENCOUNTER — Emergency Department (HOSPITAL_COMMUNITY)
Admission: EM | Admit: 2016-02-16 | Discharge: 2016-02-16 | Disposition: A | Discharge status: Home / Self Care | Payer: Self-pay | Attending: Emergency Medicine | Admitting: Emergency Medicine

## 2016-02-16 ENCOUNTER — Ambulatory Visit (INDEPENDENT_AMBULATORY_CARE_PROVIDER_SITE_OTHER): Payer: Self-pay | Admitting: Internal Medicine

## 2016-02-16 ENCOUNTER — Encounter (HOSPITAL_COMMUNITY): Payer: Self-pay | Admitting: Emergency Medicine

## 2016-02-16 VITALS — BP 148/82 | HR 100 | Temp 97.9°F | Ht 69.0 in | Wt 212.3 lb

## 2016-02-16 DIAGNOSIS — S098XXA Other specified injuries of head, initial encounter: Secondary | ICD-10-CM

## 2016-02-16 DIAGNOSIS — F17210 Nicotine dependence, cigarettes, uncomplicated: Secondary | ICD-10-CM | POA: Insufficient documentation

## 2016-02-16 DIAGNOSIS — I10 Essential (primary) hypertension: Secondary | ICD-10-CM | POA: Insufficient documentation

## 2016-02-16 DIAGNOSIS — S0992XA Unspecified injury of nose, initial encounter: Secondary | ICD-10-CM | POA: Insufficient documentation

## 2016-02-16 DIAGNOSIS — Z87442 Personal history of urinary calculi: Secondary | ICD-10-CM | POA: Insufficient documentation

## 2016-02-16 DIAGNOSIS — Y9389 Activity, other specified: Secondary | ICD-10-CM | POA: Insufficient documentation

## 2016-02-16 DIAGNOSIS — W228XXA Striking against or struck by other objects, initial encounter: Secondary | ICD-10-CM | POA: Insufficient documentation

## 2016-02-16 DIAGNOSIS — Z87438 Personal history of other diseases of male genital organs: Secondary | ICD-10-CM | POA: Insufficient documentation

## 2016-02-16 DIAGNOSIS — Z880 Allergy status to penicillin: Secondary | ICD-10-CM | POA: Insufficient documentation

## 2016-02-16 DIAGNOSIS — S0993XD Unspecified injury of face, subsequent encounter: Secondary | ICD-10-CM

## 2016-02-16 DIAGNOSIS — W208XXA Other cause of strike by thrown, projected or falling object, initial encounter: Secondary | ICD-10-CM

## 2016-02-16 DIAGNOSIS — Z79899 Other long term (current) drug therapy: Secondary | ICD-10-CM | POA: Insufficient documentation

## 2016-02-16 DIAGNOSIS — Y9289 Other specified places as the place of occurrence of the external cause: Secondary | ICD-10-CM | POA: Insufficient documentation

## 2016-02-16 DIAGNOSIS — S0993XA Unspecified injury of face, initial encounter: Secondary | ICD-10-CM | POA: Insufficient documentation

## 2016-02-16 DIAGNOSIS — Y998 Other external cause status: Secondary | ICD-10-CM | POA: Insufficient documentation

## 2016-02-16 DIAGNOSIS — E785 Hyperlipidemia, unspecified: Secondary | ICD-10-CM | POA: Insufficient documentation

## 2016-02-16 LAB — GLUCOSE, CAPILLARY: GLUCOSE-CAPILLARY: 120 mg/dL — AB (ref 65–99)

## 2016-02-16 MED ORDER — FENTANYL CITRATE (PF) 100 MCG/2ML IJ SOLN
50.0000 ug | Freq: Once | INTRAMUSCULAR | Status: AC
  Administered 2016-02-16: 50 ug via INTRAVENOUS
  Filled 2016-02-16: qty 2

## 2016-02-16 NOTE — Discharge Instructions (Signed)
There is no evidence of fracture on your head and face CT scans that would suggest a CSF leak. We did see and chronic sinusitis on imaging and I can be managed by her primary care provider. If you noticed that he continued to have drainage from the nose or ears of clear fluid please contact your primary care provider or present to the ED. If he developed increasing sinus pressure, fever, uncontrolled headaches please return to the ED for further evaluation.

## 2016-02-16 NOTE — ED Provider Notes (Signed)
CSN: [id number]     Arrival date & time 02/16/16  1605 History   First MD Initiated Contact with Patient 02/16/16 1804     Chief Complaint  Patient presents with  . Head Injury     (Consider location/radiation/quality/duration/timing/severity/associated sxs/prior Treatment) Patient is a 56 y.o. male presenting with facial injury.  Facial Injury Mechanism of injury:  Direct blow Location:  Nose and L cheek Time since incident:  2 days Pain details:    Quality:  Aching   Severity:  Moderate   Timing:  Constant   Progression:  Partially resolved Chronicity:  New Relieved by:  Nothing Worsened by:  Nothing tried Ineffective treatments:  None tried Associated symptoms: congestion, epistaxis (resolved), headaches and rhinorrhea (profuse; from left 24 hours after incident. pt does report that he flushed his sinuses with saline just after the incident.)   Associated symptoms: no difficulty breathing, no double vision, no ear pain, no loss of consciousness, no nausea, no neck pain and no vomiting     Past Medical History  Diagnosis Date  . Hyperlipidemia   . Hypertension   . Renal calculus   . Prostate disorder    Past Surgical History  Procedure Laterality Date  . Hernia repair    . Left ankle reconstruction  1999    self reported   . Hernia repair    . Rotator cuff repair    . Brain surgery    . Appendectomy    . Finger surgery     History reviewed. No pertinent family history. Social History  Substance Use Topics  . Smoking status: Current Every Day Smoker -- 2.00 packs/day for 40 years    Types: Cigarettes  . Smokeless tobacco: None     Comment: DOWN TO 1.5 PER DAY SOMETIMES.  Marland Kitchen Alcohol Use: No     Comment: Quit drinking in 1988    Review of Systems  Constitutional: Negative for fever, chills, appetite change and fatigue.  HENT: Positive for congestion, nosebleeds (resolved) and rhinorrhea (profuse; from left 24 hours after incident. pt does report that he  flushed his sinuses with saline just after the incident.). Negative for ear pain, facial swelling, mouth sores and sore throat.   Eyes: Negative for double vision and visual disturbance.  Respiratory: Negative for cough, chest tightness and shortness of breath.   Cardiovascular: Negative for chest pain and palpitations.  Gastrointestinal: Negative for nausea, vomiting, abdominal pain, diarrhea and blood in stool.  Endocrine: Negative for cold intolerance and heat intolerance.  Genitourinary: Negative for frequency, decreased urine volume and difficulty urinating.  Musculoskeletal: Negative for back pain, neck pain and neck stiffness.  Skin: Negative for rash.  Neurological: Positive for headaches. Negative for dizziness, loss of consciousness, weakness and light-headedness.  All other systems reviewed and are negative.     Allergies  Codeine and Penicillins  Home Medications   Prior to Admission medications   Medication Sig Start Date End Date Taking? Authorizing Provider  albuterol (PROVENTIL HFA;VENTOLIN HFA) 108 (90 BASE) MCG/ACT inhaler Inhale 2 puffs into the lungs every 6 (six) hours as needed for wheezing or shortness of breath. 12/15/14  Yes Ejiroghene Arlyce Dice, MD  cyclobenzaprine (FLEXERIL) 10 MG tablet Take 1 tablet (10 mg total) by mouth every 8 (eight) hours as needed for muscle spasms. 01/24/16 01/23/17 Yes Lucious Groves, DO  doxazosin (CARDURA) 4 MG tablet Take 1 tablet (4 mg total) by mouth 2 (two) times daily. 01/24/16  Yes Lucious Groves, DO  ibuprofen (ADVIL,MOTRIN) 800 MG tablet Take 1 tablet (800 mg total) by mouth every 8 (eight) hours as needed. Patient taking differently: Take 800 mg by mouth every 8 (eight) hours as needed for moderate pain.  01/24/16  Yes Lucious Groves, DO  lisinopril (PRINIVIL,ZESTRIL) 10 MG tablet Take 1 tablet (10 mg total) by mouth daily. 01/24/16  Yes Lucious Groves, DO  omeprazole (PRILOSEC) 20 MG capsule TAKE ONE CAPSULE BY MOUTH DAILY AS  NEEDED 01/24/16  Yes Lucious Groves, DO  pravastatin (PRAVACHOL) 40 MG tablet Take 1 tablet (40 mg total) by mouth at bedtime. 01/24/16  Yes Erik C Hoffman, DO   BP 141/86 mmHg  Pulse 74  Temp(Src) 98.2 F (36.8 C) (Oral)  Resp 15  Ht 5\' 9"  (1.753 m)  Wt 96.163 kg  BMI 31.29 kg/m2  SpO2 97% Physical Exam  Constitutional: He is oriented to person, place, and time. He appears well-nourished. No distress.  HENT:  Head: Normocephalic. Head is without raccoon's eyes, without Battle's sign, without abrasion, without contusion and without laceration.    Right Ear: External ear normal.  Left Ear: External ear normal.  Eyes: Pupils are equal, round, and reactive to light. Right eye exhibits no discharge. Left eye exhibits no discharge. No scleral icterus.  Neck: Normal range of motion. Neck supple.  Cardiovascular: Normal rate.  Exam reveals no gallop and no friction rub.   No murmur heard. Pulmonary/Chest: Effort normal and breath sounds normal. No stridor. No respiratory distress. He has no wheezes. He has no rales. He exhibits no tenderness.  Abdominal: Soft. He exhibits no distension and no mass. There is no tenderness. There is no rebound and no guarding.  Musculoskeletal: He exhibits no edema or tenderness.  Neurological: He is alert and oriented to person, place, and time.  Skin: Skin is warm and dry. No rash noted. He is not diaphoretic. No erythema.    ED Course  Procedures (including critical care time) Labs Review Labs Reviewed - No data to display  Imaging Review Ct Head Wo Contrast  02/16/2016  CLINICAL DATA:  Patient struck in face and head by a drive shaft which fell out of his truck. Headache and facial pain. EXAM: CT HEAD WITHOUT CONTRAST CT MAXILLOFACIAL WITHOUT CONTRAST TECHNIQUE: Multidetector CT imaging of the head and maxillofacial structures were performed using the standard protocol without intravenous contrast. Multiplanar CT image reconstructions of the  maxillofacial structures were also generated. 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 This high density structure in the left sphenoid sinus is technically nonspecific and could conceivably even represent recurrent mass. Minimal chronic ethmoid sinusitis. The sellar is mostly empty although a small infundibulum is seen extending down to the sellar floor region. Otherwise, The brainstem, cerebellum, cerebral peduncles, thalami, basal ganglia, basilar cisterns, and ventricular system appear within normal limits. No intracranial hemorrhage, mass lesion, or acute CVA. CT MAXILLOFACIAL FINDINGS Orbits unremarkable. Mild chronic ethmoid sinusitis. No facial fracture. The patient is edentulous. Cervical spondylosis and degenerative disc disease observed in the upper cervical spine likely with osseous foraminal stenosis on the right at C3-4 and C4-5, and potentially on the left at C4-5. Spurring along the upper tip of the odontoid and at the anterior C1- 2 articulation superiorly. Mild mucosal thickening in the nasal cavity, particularly on the left.  IMPRESSION: 1. No facial fracture or acute intracranial or facial findings. 2. Prior transsphenoidal surgery with some postoperative fat packing of the sphenoid sinus, and some higher density component in the left sphenoid sinus which is probably from a chronic mucous retention cyst. I am doubtful that the process in the left sphenoid sinus relates to a recurrent pituitary tumor. If this warrants further workup, pituitary protocol MRI of the brain with and without contrast would be the exam of choice. 3. Minimal chronic ethmoid sinusitis. 4. Upper cervical spondylosis, causing foraminal impingement at C3-4 and C4-5. Electronically Signed   By: Van Clines M.D.   On: 02/16/2016 19:08   Ct Maxillofacial Wo Cm  02/16/2016  CLINICAL DATA:  Patient struck in face and head by a drive shaft which fell out of his truck. Headache and facial pain. EXAM: CT HEAD WITHOUT CONTRAST CT MAXILLOFACIAL WITHOUT CONTRAST TECHNIQUE: Multidetector CT imaging of the head and maxillofacial structures were performed using the standard protocol without intravenous contrast. Multiplanar CT image reconstructions of the maxillofacial structures were also generated. 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 This high density structure in the left sphenoid sinus is technically nonspecific and could conceivably even represent recurrent mass. Minimal chronic ethmoid sinusitis. The sellar is mostly empty although a small infundibulum is seen extending down to the sellar floor region. Otherwise, The brainstem, cerebellum, cerebral peduncles, thalami, basal ganglia, basilar cisterns, and ventricular system appear within normal limits. No intracranial hemorrhage, mass lesion, or acute CVA. CT MAXILLOFACIAL FINDINGS Orbits unremarkable. Mild chronic ethmoid sinusitis. No facial fracture. The patient is edentulous. Cervical spondylosis and degenerative disc disease observed in the upper cervical spine likely with osseous foraminal stenosis on the right at C3-4 and C4-5, and potentially on the left at C4-5. Spurring along the upper tip of the odontoid and at the anterior C1- 2 articulation superiorly. Mild mucosal thickening in the nasal cavity, particularly on the left. IMPRESSION: 1. No facial fracture or acute intracranial or facial findings. 2. Prior transsphenoidal surgery with some postoperative fat packing of the sphenoid sinus, and some higher density component in  the left sphenoid sinus which is probably from a chronic mucous retention cyst. I am doubtful that the process in the left sphenoid sinus relates to a recurrent pituitary tumor. If this warrants further workup, pituitary protocol MRI of the brain with and without contrast would be the exam of choice. 3. Minimal chronic ethmoid sinusitis. 4. Upper cervical spondylosis, causing foraminal impingement at C3-4 and C4-5. Electronically Signed   By: Van Clines M.D.   On: 02/16/2016 19:08   I have personally reviewed and evaluated these images and lab results as part of my medical decision-making.   EKG Interpretation None      MDM   56 year old male presents to the ED for evaluation of possible CSF leak after direct facial trauma more than 24 hours prior to arrival. 2 days ago the patient had a drive shaft fall on his nasal bridge. 24 hours after the incident he noted clear fluid draining. He did report that he flushed the sinuses with saline just after the incident. He contacted his PCP who recommended coming to the ED for evaluation. Rest of the history as above.  On exam patient is afebrile with stable vital signs. No focal neurologic deficits  noted on exam. Patient does have chronic back pain which limits the exam however has intact strength. CT head and face were unremarkable for any fractures. No physical evidence of basal skull fracture. There is low suspicion for CSF leak given the fact that this occurred 24 hours after the incident and is now resolved.   He is safe for discharge with strict return precautions. Patient will follow up with his PCP as needed.   Isn't seen in conjunction with Dr. Oleta Mouse.  Final diagnoses:  Blunt trauma of face, subsequent encounter        Addison Lank, MD 02/16/16 1959  Forde Dandy, MD 02/17/16 [phone]

## 2016-02-16 NOTE — Progress Notes (Signed)
Internal Medicine Clinic Attending  I saw and evaluated the patient.  I personally confirmed the key portions of the history and exam documented by Dr. Gordy Levan and I reviewed pertinent patient test results.  The assessment, diagnosis, and plan were formulated together and I agree with the documentation in the resident's note. I was present for the entirety of the procedure and personally performed key portions.

## 2016-02-16 NOTE — ED Notes (Signed)
MD at bedside. 

## 2016-02-16 NOTE — Assessment & Plan Note (Signed)
A: Blunt Trauma of nose  P: He is very tender to palpation and may have a fracture of his facial bones however I am most concerned with his clear rhinorrhea and eye discharge for a possible CSF leak.  I am referring him to the ED for further evaluation.

## 2016-02-16 NOTE — ED Provider Notes (Signed)
I saw and evaluated the patient, reviewed the resident's note and I agree with the findings and plan.   EKG Interpretation None       56 year old male who presents with facial pain two days ago. Not on anticoagulation. States a drive shaft of a car fell on top of his face two days ago while he was working underneath a car. No LOC. Has had increased swelling to bridge of nose and left cheek since then with facial pain and headache. Did initially have epistaxis, which he tried to flush out by pushing saline through his nose. Then noticed that he started having drainage from left nose. Sent to ED for evaluation of head injury by PCP. Neuro in tact on arrival. Mild soft tissue swelling over bridge of nose and left cheek. No septal hematoma.  No signs of basilar skull fracture, and low suspicion for temporal bone fracture. CT head and face negative for acute traumatic injury. Discussed supportive care instructions for home. Strict return and follow-up instructions reviewed. He expressed understanding of all discharge instructions and felt comfortable with the plan of care.   Forde Dandy, MD 02/16/16 Cas[facility] Lanius

## 2016-02-16 NOTE — ED Notes (Signed)
Pt sent here from [facility] for eval of having drive shaft on his face 2 days ago; pt sent here for eval of possible CSF leak due to drainage from left eye and left nostril

## 2016-02-16 NOTE — Progress Notes (Signed)
[facility] Subjective:   Patient ID: [name] Acosta male   DOB: [birth year] 56 y.o.   MRN: [id number]  HPI: [name] Acosta is a 56 y.o. male with a PMH detailed below who presents for an acute visit after a drive shaft fell on the bridge of his nose 2 days ago. He reports he was working under his truck when the drive shaft came loose and fell on his nose, he immediatly had about 45 minutes of nose bleeding which eventually stopped.  He also reported pain and headache.  Since that time he has continued to have intermittent clear nasal drainage as well as 2 episodes of clear drainage from his left eye.  He also reports blurry vision on his left eye.    Past Medical History  Diagnosis Date  . Hyperlipidemia   . Hypertension   . Renal calculus   . Prostate disorder    Current Outpatient Prescriptions  Medication Sig Dispense Refill  . albuterol (PROVENTIL HFA;VENTOLIN HFA) 108 (90 BASE) MCG/ACT inhaler Inhale 2 puffs into the lungs every 6 (six) hours as needed for wheezing or shortness of breath. 1 Inhaler 0  . aspirin 81 MG tablet Take 81 mg by mouth daily.    . cyclobenzaprine (FLEXERIL) 10 MG tablet Take 1 tablet (10 mg total) by mouth every 8 (eight) hours as needed for muscle spasms. 30 tablet 1  . doxazosin (CARDURA) 4 MG tablet Take 1 tablet (4 mg total) by mouth 2 (two) times daily. 180 tablet 1  . ibuprofen (ADVIL,MOTRIN) 800 MG tablet Take 1 tablet (800 mg total) by mouth every 8 (eight) hours as needed. 90 tablet 2  . lisinopril (PRINIVIL,ZESTRIL) 10 MG tablet Take 1 tablet (10 mg total) by mouth daily. 90 tablet 1  . omeprazole (PRILOSEC) 20 MG capsule TAKE ONE CAPSULE BY MOUTH DAILY AS NEEDED 90 capsule 1  . pravastatin (PRAVACHOL) 40 MG tablet Take 1 tablet (40 mg total) by mouth at bedtime. 90 tablet 1   No current facility-administered medications for this visit.   No family history on file. Social History   Social History  . Marital Status:  Legally Separated    Spouse Name: N/A  . Number of Children: N/A  . Years of Education: N/A   Social History Main Topics  . Smoking status: Current Every Day Smoker -- 2.00 packs/day for 40 years    Types: Cigarettes  . Smokeless tobacco: None     Comment: DOWN TO 1.5 PER DAY SOMETIMES.  Marland Kitchen Alcohol Use: No     Comment: Quit drinking in 1988  . Drug Use: None  . Sexual Activity: Not Asked   Other Topics Concern  . None   Social History Narrative   Review of Systems: Review of Systems  Constitutional: Negative for fever and chills.  HENT: Positive for nosebleeds.   Neurological: Positive for headaches.     Objective:  Physical Exam: Filed Vitals:   02/16/16 1520  BP: 148/82  Pulse: 100  Temp: 97.9 F (36.6 C)  TempSrc: Oral  Height: 5\' 9"  (1.753 m)  Weight: 212 lb 4.8 oz (96.299 kg)  SpO2: 97%  Physical Exam  Constitutional: He is well-developed, well-nourished, and in no distress.  HENT:  Maxillary, and nasal tenderness, no obious deformity of nose.    Eyes: EOM are normal. Pupils are equal, round, and reactive to light.  Left eye more closed statically compared to right eye, decreased visual acuity in left eye  Nursing note and vitals reviewed.   Assessment & Plan:  Case discussed with Dr. Dareen Piano  Blunt trauma of nose A: Blunt Trauma of nose  P: He is very tender to palpation and may have a fracture of his facial bones however I am most concerned with his clear rhinorrhea and eye discharge for a possible CSF leak.  I am referring him to the ED for further evaluation.    Medications Ordered No orders of the defined types were placed in this encounter.   Other Orders Orders Placed This Encounter  Procedures  . Glucose, capillary   Follow Up: Return if symptoms worsen or fail to improve.

## 2016-02-16 NOTE — ED Notes (Signed)
Patient transported to CT 

## 2016-02-17 NOTE — Progress Notes (Signed)
Internal Medicine Clinic Attending  I saw and evaluated the patient.  I personally confirmed the key portions of the history and exam documented by Dr. Heber Austwell and I reviewed pertinent patient test results.  The assessment, diagnosis, and plan were formulated together and I agree with the documentation in the resident's note.  Patient was seen in the ED and no acute fractures were noted on CT maxillofacial. Was discharged home yesterday from ED

## 2016-02-27 ENCOUNTER — Ambulatory Visit: Payer: Self-pay

## 2016-04-20 ENCOUNTER — Telehealth: Payer: Self-pay | Admitting: *Deleted

## 2016-04-20 NOTE — Telephone Encounter (Signed)
Pt calls angry, states the pain in his feet is the worse pain and no one is doing anything, states he is going to start drinking again if something is not done, states the last doctor told him to take ibuprofen and now he has seen blood in his stool. appt given w/ dr gill for Monday, ask to go to ED if he has rectal bleeding and to try to abstain from alcohol use

## 2016-04-23 ENCOUNTER — Encounter: Payer: Self-pay | Admitting: Internal Medicine

## 2016-04-23 ENCOUNTER — Ambulatory Visit (HOSPITAL_COMMUNITY)
Admission: RE | Admit: 2016-04-23 | Discharge: 2016-04-23 | Disposition: A | Discharge status: Home / Self Care | Payer: Self-pay | Source: Ambulatory Visit | Attending: Internal Medicine | Admitting: Internal Medicine

## 2016-04-23 ENCOUNTER — Ambulatory Visit (INDEPENDENT_AMBULATORY_CARE_PROVIDER_SITE_OTHER): Payer: Self-pay | Admitting: Internal Medicine

## 2016-04-23 VITALS — BP 142/86 | HR 99 | Temp 98.1°F | Ht 69.0 in | Wt 215.4 lb

## 2016-04-23 DIAGNOSIS — G8929 Other chronic pain: Secondary | ICD-10-CM | POA: Insufficient documentation

## 2016-04-23 DIAGNOSIS — M25572 Pain in left ankle and joints of left foot: Secondary | ICD-10-CM | POA: Insufficient documentation

## 2016-04-23 DIAGNOSIS — F17210 Nicotine dependence, cigarettes, uncomplicated: Secondary | ICD-10-CM

## 2016-04-23 DIAGNOSIS — I10 Essential (primary) hypertension: Secondary | ICD-10-CM

## 2016-04-23 DIAGNOSIS — F17200 Nicotine dependence, unspecified, uncomplicated: Secondary | ICD-10-CM

## 2016-04-23 NOTE — Assessment & Plan Note (Signed)
Increased smoking to about 2.5 ppd. -counseled on cessation and likely PAD
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
## 2016-04-23 NOTE — Assessment & Plan Note (Signed)
BP 142/86.   -cont current meds, likely increased d/t pain

## 2016-04-23 NOTE — Patient Instructions (Signed)
Thank you for your visit today.   Please return to the internal medicine clinic in about 6 weeks or sooner if needed.     I have made the following additions/changes to your medications:  Continue your current medications.  Please do not take any more ibuprofen.  You may take tylenol up to 4000mg  day.   Please be sure to bring all of your medications with you to every visit; this includes herbal supplements, vitamins, eye drops, and any over-the-counter medications.   Should you have any questions regarding your medications and/or any new or worsening symptoms, please be sure to call the clinic at [phone].   If you believe that you are suffering from a life threatening condition or one that may result in the loss of limb or function, then you should call 911 and proceed to the nearest Emergency Department.   A healthy lifestyle and preventative care can promote health and wellness.   Maintain regular health, dental, and eye exams.  Eat a healthy diet. Foods like vegetables, fruits, whole grains, low-fat dairy products, and lean protein foods contain the nutrients you need without too many calories. Decrease your intake of foods high in solid fats, added sugars, and salt. Get information about a proper diet from your caregiver, if necessary.  Regular physical exercise is one of the most important things you can do for your health. Most adults should get at least 150 minutes of moderate-intensity exercise (any activity that increases your heart rate and causes you to sweat) each week. In addition, most adults need muscle-strengthening exercises on 2 or more days a week.   Maintain a healthy weight. The body mass index (BMI) is a screening tool to identify possible weight problems. It provides an estimate of body fat based on height and weight. Your caregiver can help determine your BMI, and can help you achieve or maintain a healthy weight. For adults 20 years and older:  A BMI below  18.5 is considered underweight.  A BMI of 18.5 to 24.9 is normal.  A BMI of 25 to 29.9 is considered overweight.  A BMI of 30 and above is considered obese.

## 2016-04-23 NOTE — Progress Notes (Signed)
Patient ID: [name] Acosta, male   DOB: 11-24-1960, 56 y.o.   MRN: [id number]    Subjective:   Patient ID: [name] Acosta male    DOB: May 24, 1960 56 y.o.    MRN: [id number] Health Maintenance Due: Health Maintenance Due  Topic Date Due  . FOOT EXAM  11/19/1970    _________________________________________________  HPI: [name] is a 56 y.o. male here for acute visit for left ankle/foot/leg pain.  Pt has a PMH outlined below.  Please see problem-based charting assessment and plan for further status of patient's chronic medical problems addressed at today's visit.  PMH: Past Medical History  Diagnosis Date  . Hyperlipidemia   . Hypertension   . Renal calculus   . Prostate disorder     Medications: Current Outpatient Prescriptions on File Prior to Visit  Medication Sig Dispense Refill  . albuterol (PROVENTIL HFA;VENTOLIN HFA) 108 (90 BASE) MCG/ACT inhaler Inhale 2 puffs into the lungs every 6 (six) hours as needed for wheezing or shortness of breath. 1 Inhaler 0  . cyclobenzaprine (FLEXERIL) 10 MG tablet Take 1 tablet (10 mg total) by mouth every 8 (eight) hours as needed for muscle spasms. 30 tablet 1  . doxazosin (CARDURA) 4 MG tablet Take 1 tablet (4 mg total) by mouth 2 (two) times daily. 180 tablet 1  . lisinopril (PRINIVIL,ZESTRIL) 10 MG tablet Take 1 tablet (10 mg total) by mouth daily. 90 tablet 1  . omeprazole (PRILOSEC) 20 MG capsule TAKE ONE CAPSULE BY MOUTH DAILY AS NEEDED 90 capsule 1  . pravastatin (PRAVACHOL) 40 MG tablet Take 1 tablet (40 mg total) by mouth at bedtime. 90 tablet 1   No current facility-administered medications on file prior to visit.    Allergies: Allergies  Allergen Reactions  . Codeine     REACTION: Unknown reaction  . Penicillins     Hives and Swelling. Has patient had a PCN reaction causing immediate rash, facial/tongue/throat swelling, SOB or lightheadedness with hypotension:YES Has patient had a PCN reaction causing severe  rash involving mucus membranes or skin necrosis: NO Has patient had a PCN reaction that required hospitalization NO Has patient had a PCN reaction occurring within the last 10 years: No If all of the above answers are "NO", then may proceed with Cephalosporin use.    FH: No family history on file.  SH: Social History   Social History  . Marital Status: Legally Separated    Spouse Name: N/A  . Number of Children: N/A  . Years of Education: N/A   Social History Main Topics  . Smoking status: Current Every Day Smoker -- 2.00 packs/day for 40 years    Types: Cigarettes  . Smokeless tobacco: Not on file     Comment: DOWN TO 1.5 PER DAY SOMETIMES.  Marland Kitchen Alcohol Use: No     Comment: Quit drinking in 1988  . Drug Use: Not on file  . Sexual Activity: Not on file   Other Topics Concern  . Not on file   Social History Narrative    Review of Systems: Constitutional: Negative for fever, chills.  Eyes: Negative for blurred vision.  Respiratory: Negative for cough and shortness of breath.  Cardiovascular: Negative for chest pain.  Gastrointestinal: Negative for nausea, vomiting. Neurological: Negative for dizziness.   Objective:   Vital Signs: Filed Vitals:   04/23/16 1326  BP: 142/86  Pulse: 99  Temp: 98.1 F (36.7 C)  TempSrc: Oral  Height: 5\' 9"  (1.753 m)  Weight:  215 lb 6.4 oz (97.705 kg)  SpO2: 99%      BP Readings from Last 3 Encounters:  04/23/16 142/86  02/16/16 145/91  02/16/16 148/82    Physical Exam: Constitutional: Vital signs reviewed.  Patient is in NAD and cooperative with exam.  Head: Normocephalic and atraumatic. Eyes: EOMI, conjunctivae nl, no scleral icterus.  Neck: Supple. Cardiovascular: RRR, no MRG. Pulmonary/Chest: Normal effort, CTAB, no wheezes, rales, or rhonchi. Abdominal: Soft. NT/ND +BS. Neurological: A&O x3, cranial nerves II-XII are grossly intact, moving all extremities. Extremities: Diminished DP especially on the left.   Extremity was cooler on the right than left.   Skin: Warm, dry and intact.    Assessment & Plan:   Assessment and plan was discussed and formulated with my attending.

## 2016-04-24 LAB — BMP8+ANION GAP
Anion Gap: 18 mmol/L (ref 10.0–18.0)
BUN/Creatinine Ratio: 15 (ref 9–20)
BUN: 12 mg/dL (ref 6–24)
CALCIUM: 9.2 mg/dL (ref 8.7–10.2)
CO2: 22 mmol/L (ref 18–29)
Chloride: 106 mmol/L (ref 96–106)
Creatinine, Ser: 0.81 mg/dL (ref 0.76–1.27)
GFR calc non Af Amer: 100 mL/min/{1.73_m2} (ref >59)
GFR, EST AFRICAN AMERICAN: 116 mL/min/{1.73_m2} (ref >59)
GLUCOSE: 86 mg/dL (ref 65–99)
Potassium: 3.9 mmol/L (ref 3.5–5.2)
Sodium: 146 mmol/L — ABNORMAL HIGH (ref 134–144)

## 2016-04-24 LAB — CBC
HEMATOCRIT: 45.1 % (ref 37.5–51.0)
Hemoglobin: 15.4 g/dL (ref 12.6–17.7)
MCH: 33.3 pg — ABNORMAL HIGH (ref 26.6–33.0)
MCHC: 34.1 g/dL (ref 31.5–35.7)
MCV: 98 fL — AB (ref 79–97)
Platelets: 205 10*3/uL (ref 150–379)
RBC: 4.62 x10E6/uL (ref 4.14–5.80)
RDW: 13.2 % (ref 12.3–15.4)
WBC: 9.4 10*3/uL (ref 3.4–10.8)

## 2016-04-25 NOTE — Progress Notes (Signed)
Case discussed with Dr. Gill soon after the resident saw the patient.  We reviewed the resident's history and exam and pertinent patient test results.  I agree with the assessment, diagnosis, and plan of care documented in the resident's note. 

## 2016-04-30 ENCOUNTER — Telehealth: Payer: Self-pay

## 2016-04-30 ENCOUNTER — Encounter: Payer: Self-pay | Admitting: Internal Medicine

## 2016-04-30 NOTE — Telephone Encounter (Signed)
Pt requesting the nurse to call back. 

## 2016-04-30 NOTE — Telephone Encounter (Signed)
Pt calls and ask about ultrasound to LE due to dr gill telling him he needs to have one due to decreased pulse Please advise

## 2016-05-01 ENCOUNTER — Other Ambulatory Visit: Payer: Self-pay

## 2016-05-01 NOTE — Telephone Encounter (Signed)
rec'd fax request from pharmacy asking to change doxazosin 4mg  BID to an 8mg  tab daily.  They say this will be half the cost of the current order.  Is this appropriate? Please advise

## 2016-05-02 MED ORDER — DOXAZOSIN MESYLATE 4 MG PO TABS: 4.0000 mg | ORAL_TABLET | Freq: Two times a day (BID) | ORAL | Status: DC

## 2016-05-02 NOTE — Telephone Encounter (Signed)
He is referring to ABIs that have previously been ordered.  Please check into this for scheduling.  Thanks.

## 2016-05-03 NOTE — Telephone Encounter (Signed)
Called in 8mg . Cardura. Asked pharmacist to speak with patient regarding the side effect of orthostatic hypotension and to call us with any concerns as per Dr. Ferd Glassing request.

## 2016-05-03 NOTE — Telephone Encounter (Signed)
I am concerned that taking 8mg  once daily may increase the risk of orthostatic hypotension.  I am OK with the change but would request the pharmacist to caution him about the side effects and to let [name] Acosta that he should call the clinic if he experiences any side effects from the 8mg  dose.

## 2016-05-03 NOTE — Telephone Encounter (Signed)
Incoming call from pharmacy requesting the prescription to be changed from 2  4mg . Tabs to 1 8mg  tabs. Patient is paying double for the 4mg . Tab prescription. Is it okay to call and request the 8 mg. Tabs?

## 2016-05-08 ENCOUNTER — Ambulatory Visit (HOSPITAL_COMMUNITY)
Admission: RE | Admit: 2016-05-08 | Discharge: 2016-05-08 | Disposition: A | Discharge status: Home / Self Care | Payer: Self-pay | Source: Ambulatory Visit | Attending: Internal Medicine | Admitting: Internal Medicine

## 2016-05-08 DIAGNOSIS — G8929 Other chronic pain: Secondary | ICD-10-CM | POA: Insufficient documentation

## 2016-05-08 DIAGNOSIS — M25572 Pain in left ankle and joints of left foot: Secondary | ICD-10-CM | POA: Insufficient documentation

## 2016-05-08 NOTE — Progress Notes (Signed)
VASCULAR LAB PRELIMINARY  ARTERIAL  ABI completed:    RIGHT    LEFT    PRESSURE WAVEFORM  PRESSURE WAVEFORM  BRACHIAL 154 Triphasic BRACHIAL 141 Triphasic  DP 125 Triphasic DP 152 Triphasic  AT   AT    PT 161 Triphasic PT 164 Triphasic  PER   PER    GREAT TOE  NA GREAT TOE  NA    RIGHT LEFT  ABI 1.05 1.06   Bilateral ABIs are within normal limits.  05/08/2016 11:38 AM Maudry Mayhew, RVT, RDCS, RDMS

## 2016-05-11 ENCOUNTER — Telehealth: Payer: Self-pay

## 2016-05-11 NOTE — Telephone Encounter (Signed)
rec'd call from patient who questions whether recent ABI's caused symptoms he experienced overnight following the procedure.   Patient reporting being woken out of sleep Tuesday evening to chest pain radiating to his right shoulder and up his neck.  Endorses nausea and SOB.  Reports taking a "muscle relaxer because he lost control of his bladder function," and when the pain persisted, 4 hours later he took another muscle relaxer.  Patient says the pain continues, however it is not as intense.  He asks if the "ultrasound" could have dislodged a blood clot.  I have educated him on ABI's.  In speaking with him the pain was more evident in his shoulder and neck than his chest, however I have advised him to be seen and evaluated to r/o a cardiac event.  He understands, however he cannot come in today. He reports he may be able to get to UC or ED later today.  He is aware if sudden onset chest pain/SOB were to happen again he should call 911 or come to the ED immediately.  He is on the schedule for Monday to follow up.

## 2016-05-11 NOTE — Telephone Encounter (Signed)
Agree with need for urgent eval.

## 2016-05-14 ENCOUNTER — Ambulatory Visit (HOSPITAL_COMMUNITY)
Admission: RE | Admit: 2016-05-14 | Discharge: 2016-05-14 | Disposition: A | Discharge status: Home / Self Care | Payer: Self-pay | Source: Ambulatory Visit | Attending: Internal Medicine | Admitting: Internal Medicine

## 2016-05-14 ENCOUNTER — Encounter: Payer: Self-pay | Admitting: Internal Medicine

## 2016-05-14 ENCOUNTER — Ambulatory Visit (INDEPENDENT_AMBULATORY_CARE_PROVIDER_SITE_OTHER): Payer: Self-pay | Admitting: Internal Medicine

## 2016-05-14 VITALS — BP 147/85 | HR 98 | Temp 98.0°F | Ht 69.0 in | Wt 210.5 lb

## 2016-05-14 DIAGNOSIS — G8929 Other chronic pain: Secondary | ICD-10-CM

## 2016-05-14 DIAGNOSIS — R079 Chest pain, unspecified: Secondary | ICD-10-CM | POA: Insufficient documentation

## 2016-05-14 DIAGNOSIS — M25572 Pain in left ankle and joints of left foot: Secondary | ICD-10-CM

## 2016-05-14 DIAGNOSIS — R9431 Abnormal electrocardiogram [ECG] [EKG]: Secondary | ICD-10-CM | POA: Insufficient documentation

## 2016-05-14 HISTORY — DX: Chest pain, unspecified: R07.9

## 2016-05-14 MED ORDER — DICLOFENAC SODIUM 1 % TD GEL: 4.0000 g | Freq: Two times a day (BID) | TRANSDERMAL | Status: DC

## 2016-05-14 NOTE — Patient Instructions (Addendum)
Mr. Frame,  It was great meeting you today.  For your ankle pain and hip pain, we are going to start voltaren gel. The great thing about this is that it completely bypasses your stomach and and acts at the source of your pain. We will try this out for 2 weeks, and if this is not practical or effective, we will explore other options for pain management that are non- to minimally- sedationg.  We got an EKG today to look at the function of your heart and to see if you've had a heart attack. There is NO evidence you've had a heart attack.  Chest Wall Pain Chest wall pain is pain in or around the bones and muscles of your chest. Sometimes, an injury causes this pain. Sometimes, the cause may not be known. This pain may take several weeks or longer to get better. HOME CARE INSTRUCTIONS  Pay attention to any changes in your symptoms. Take these actions to help with your pain:   Rest as told by your health care provider.   Avoid activities that cause pain. These include any activities that use your chest muscles or your abdominal and side muscles to lift heavy items.   If directed, apply ice to the painful area:  Put ice in a plastic bag.  Place a towel between your skin and the bag.  Leave the ice on for 20 minutes, 2-3 times per day.  Take over-the-counter and prescription medicines only as told by your health care provider.  Do not use tobacco products, including cigarettes, chewing tobacco, and e-cigarettes. If you need help quitting, ask your health care provider.  Keep all follow-up visits as told by your health care provider. This is important. SEEK MEDICAL CARE IF:  You have a fever.  Your chest pain becomes worse.  You have new symptoms. SEEK IMMEDIATE MEDICAL CARE IF:  You have nausea or vomiting.  You feel sweaty or light-headed.  You have a cough with phlegm (sputum) or you cough up blood.  You develop shortness of breath.   This information is not intended to  replace advice given to you by your health care provider. Make sure you discuss any questions you have with your health care provider.   Document Released: 12/17/2005 Document Revised: 09/07/2015 Document Reviewed: 03/14/2015 Elsevier Interactive Patient Education [employer].

## 2016-05-14 NOTE — Progress Notes (Signed)
   Subjective:    Patient ID: [name] Acosta, male    DOB: 07-Dec-1960, 56 y.o.   MRN: [id number]  HPI  [name] Acosta is a 56 year old man with a PMH of HLD, chronic left foot pain s/p trauma from fall in 1990s, T2DM, GERD, tobacco abuse who comes to the clinic to discuss an episode of acute chest/shoulder pain and his ongoing left foot pain. He says about a week ago he felt a stabbing sensation in the center of chest that radiated to his back and eventually involved his right shoulder. The chest pain started in the middle of the night and lasted until morning. The shoulder pain lasted for several days and was constant and did not vary with activity. It has since resolved entirely. He also describes ongoing left foot and ankle pain that has been going on "since the 90s" and has been gradually worsening. He describes an episode where he fell from a ladder from 2 stories and landed directly on his left foot. He said this required an extensive surgery since all the bones were "shattered." At his last clinic visit, he was complaining of the same left foot pain,  And he had x-rays which showed chronic degenerative changes at his last clinic visit. He says his left foot will occasionall  He denies any history of myocardial infarction, DVTs, family history of blood clots, recent prolonged immobilization, recent travel.   Review of Systems  Constitutional: Negative for fever and fatigue.  Respiratory: Negative for cough, shortness of breath and wheezing.   Cardiovascular: Negative for chest pain, palpitations and leg swelling.  Musculoskeletal: Positive for back pain, arthralgias and gait problem.  Neurological: Negative for dizziness and light-headedness.       Objective:   Physical Exam  Constitutional: He appears well-developed. No distress.  HENT:  Mouth/Throat: Oropharynx is clear and moist. No oropharyngeal exudate.  Neck: Normal range of motion. Neck supple.  Cardiovascular: Normal rate, regular  rhythm and normal heart sounds.   Pulmonary/Chest: Effort normal and breath sounds normal. No respiratory distress. He has no wheezes.  Abdominal: Soft. Bowel sounds are normal. He exhibits no distension. There is no tenderness.  Musculoskeletal: He exhibits no edema.  Antalgic gait. Limited ROM of left ankle. No significant swelling. Calves 35 cm circumference bilaterally.  Neurological: He is alert. He has normal reflexes.  Skin: Skin is warm and dry.  Psychiatric: He has a normal mood and affect. His behavior is normal.  Vitals reviewed.         Assessment & Plan:   Please see problem based assessment and plan for details.

## 2016-05-14 NOTE — Assessment & Plan Note (Signed)
A: ABIs performed and were normal. Pain has been on and off since left ankle reconstruction in the 1990s. Left ankle X-ray shows diffuse degenerative changes, but not acute fracture. It is likely OA symptoms are driving the current presentation. He says this pain significantly limits his activities and reduces his quality of life. His history of peptic ulcer disease precludes oral NSAID therapy at this time.  P: Voltaren gel Referral to ortho if not treated by next visit May be candidate for a pain clinic in the future.

## 2016-05-14 NOTE — Assessment & Plan Note (Signed)
EKG interpretation: Normal sinus rhythm with nonspecific T-wave changes. No ischemic changes. No evidence or prior infarct.  A: Wells score is effectively -2 to 0. EKG shows no signs of prior infarct. Not clearly anginal symptoms since it did not vary with exertion. The patient does have a h/o peptic ulcer disease, which may have been a driver of his symptoms. No recent NSAID use. His symptoms have entirely resolved  P: Patient has risk factors (HTN, smoking), so if symptoms recur, consider NM stress test.

## 2016-05-15 NOTE — Progress Notes (Signed)
Internal Medicine Clinic Attending  Case discussed with Dr. Ford at the time of the visit.  We reviewed the resident's history and exam and pertinent patient test results.  I agree with the assessment, diagnosis, and plan of care documented in the resident's note.  

## 2016-05-25 ENCOUNTER — Other Ambulatory Visit: Payer: Self-pay | Admitting: Internal Medicine

## 2016-05-25 DIAGNOSIS — G8929 Other chronic pain: Secondary | ICD-10-CM

## 2016-05-25 DIAGNOSIS — M25572 Pain in left ankle and joints of left foot: Principal | ICD-10-CM

## 2016-05-29 ENCOUNTER — Ambulatory Visit (INDEPENDENT_AMBULATORY_CARE_PROVIDER_SITE_OTHER): Payer: Self-pay | Admitting: Internal Medicine

## 2016-05-29 ENCOUNTER — Encounter: Payer: Self-pay | Admitting: Internal Medicine

## 2016-05-29 VITALS — BP 135/79 | HR 82 | Temp 97.7°F | Ht 69.0 in | Wt 215.2 lb

## 2016-05-29 DIAGNOSIS — N401 Enlarged prostate with lower urinary tract symptoms: Secondary | ICD-10-CM

## 2016-05-29 DIAGNOSIS — M170 Bilateral primary osteoarthritis of knee: Secondary | ICD-10-CM

## 2016-05-29 DIAGNOSIS — M25572 Pain in left ankle and joints of left foot: Secondary | ICD-10-CM

## 2016-05-29 DIAGNOSIS — F17200 Nicotine dependence, unspecified, uncomplicated: Secondary | ICD-10-CM

## 2016-05-29 DIAGNOSIS — I10 Essential (primary) hypertension: Secondary | ICD-10-CM

## 2016-05-29 DIAGNOSIS — R39198 Other difficulties with micturition: Secondary | ICD-10-CM

## 2016-05-29 DIAGNOSIS — N400 Enlarged prostate without lower urinary tract symptoms: Secondary | ICD-10-CM

## 2016-05-29 DIAGNOSIS — R079 Chest pain, unspecified: Secondary | ICD-10-CM

## 2016-05-29 DIAGNOSIS — G8929 Other chronic pain: Secondary | ICD-10-CM

## 2016-05-29 DIAGNOSIS — R0789 Other chest pain: Secondary | ICD-10-CM

## 2016-05-29 DIAGNOSIS — F17210 Nicotine dependence, cigarettes, uncomplicated: Secondary | ICD-10-CM

## 2016-05-29 DIAGNOSIS — R3912 Poor urinary stream: Secondary | ICD-10-CM

## 2016-05-29 NOTE — Assessment & Plan Note (Signed)
Pt reports voltaren gel helps somewhat for about 3 hrs and then he needs to reapply.  He states that is inconvenient.  Unfortunately, he needs referral to orthopedics for his left foot but they are not taking any orange card referrals this month.  Unfortunately, he is back to using ibuprofen 800mg  three times daily and tylenol PRN.  I discussed that he should also make sure to take the prilosec daily while on ibuprofen.   -will refer to SM -cont voltaren gel

## 2016-05-29 NOTE — Progress Notes (Signed)
Patient ID: [name] Acosta, male   DOB: 04/14/1960, 56 y.o.   MRN: [id number]     Subjective:   Patient ID: [name] Acosta male    DOB: March 07, 1960 56 y.o.    MRN: [id number] Health Maintenance Due: Health Maintenance Due  Topic Date Due  . FOOT EXAM  11/19/1970    _________________________________________________  HPI: [name] Acosta is a 56 y.o. male here for 2 week f/u for chronic left foot pain.  Pt has a PMH outlined below.  Please see problem-based charting assessment and plan for further status of patient's chronic medical problems addressed at today's visit.  PMH: Past Medical History  Diagnosis Date  . Hyperlipidemia   . Hypertension   . Renal calculus   . Prostate disorder     Medications: Current Outpatient Prescriptions on File Prior to Visit  Medication Sig Dispense Refill  . albuterol (PROVENTIL HFA;VENTOLIN HFA) 108 (90 BASE) MCG/ACT inhaler Inhale 2 puffs into the lungs every 6 (six) hours as needed for wheezing or shortness of breath. 1 Inhaler 0  . cyclobenzaprine (FLEXERIL) 10 MG tablet Take 1 tablet (10 mg total) by mouth every 8 (eight) hours as needed for muscle spasms. 30 tablet 1  . diclofenac sodium (VOLTAREN) 1 % GEL Apply 4 g topically 2 (two) times daily. 1 Tube 5  . doxazosin (CARDURA) 4 MG tablet Take 1 tablet (4 mg total) by mouth 2 (two) times daily. 180 tablet 0  . lisinopril (PRINIVIL,ZESTRIL) 10 MG tablet Take 1 tablet (10 mg total) by mouth daily. 90 tablet 1  . omeprazole (PRILOSEC) 20 MG capsule TAKE ONE CAPSULE BY MOUTH DAILY AS NEEDED 90 capsule 1  . pravastatin (PRAVACHOL) 40 MG tablet Take 1 tablet (40 mg total) by mouth at bedtime. 90 tablet 1   No current facility-administered medications on file prior to visit.    Allergies: Allergies  Allergen Reactions  . Codeine     REACTION: Unknown reaction  . Penicillins     Hives and Swelling. Has patient had a PCN reaction causing immediate rash, facial/tongue/throat swelling, SOB  or lightheadedness with hypotension:YES Has patient had a PCN reaction causing severe rash involving mucus membranes or skin necrosis: NO Has patient had a PCN reaction that required hospitalization NO Has patient had a PCN reaction occurring within the last 10 years: No If all of the above answers are "NO", then may proceed with Cephalosporin use.    FH: No family history on file.  SH: Social History   Social History  . Marital Status: Legally Separated    Spouse Name: N/A  . Number of Children: N/A  . Years of Education: N/A   Social History Main Topics  . Smoking status: Current Every Day Smoker -- 2.00 packs/day for 40 years    Types: Cigarettes  . Smokeless tobacco: None     Comment: DOWN TO 1.5 PER DAY SOMETIMES.  Marland Kitchen Alcohol Use: No     Comment: Quit drinking in 1988  . Drug Use: None  . Sexual Activity: Not Asked   Other Topics Concern  . None   Social History Narrative    Review of Systems: Constitutional: Negative for fever, chills.  Cardiovascular: Negative for +chest pain.  Musculoskeletal: +bilateral knee pain and foot pain.    Objective:   Vital Signs: Filed Vitals:   05/29/16 1417  BP: 135/79  Pulse: 82  Temp: 97.7 F (36.5 C)  TempSrc: Oral  Height: 5\' 9"  (1.753 m)  Weight: 215  lb 3.2 oz (97.614 kg)  SpO2: 100%      BP Readings from Last 3 Encounters:  05/29/16 135/79  05/14/16 147/85  04/23/16 142/86    Physical Exam: Constitutional: Vital signs reviewed.  Patient is in NAD and cooperative with exam.  Head: Normocephalic and atraumatic. Eyes: EOMI, conjunctivae nl, no scleral icterus.  Neck: Supple. Cardiovascular: RRR, no MRG. Pulmonary/Chest: Normal effort, decreased breath sounds.   Abdominal: Soft. Obese.  NT/ND +BS. Neurological: A&O x3, cranial nerves II-XII are grossly intact, moving all extremities. Extremities: Has long socks on with boots but from the top of the socks, I did not appreciate any LE edema.   Skin: Warm, dry  and intact. No rash.   Assessment & Plan:   Assessment and plan was discussed and formulated with my attending.

## 2016-05-29 NOTE — Assessment & Plan Note (Signed)
Pt c/o intermittent substernal chest pain, sometimes exertional.  He is at high risk for CAD given HTN, dyslipidemia, obesity, prediabetes, and smoking.  I had referred him to cardiology in the past but unfortunately he did not have the orange card at that time. -referral to cardiology

## 2016-05-29 NOTE — Patient Instructions (Signed)
Thank you for your visit today.   Please return to the internal medicine clinic in about 6 weeks or sooner if needed.     I have made the following additions/changes to your medications:  Please try to limit the amount of ibuprofen you are taking daily.  Please be sure to take prilosec daily. I will refer you to sports medicine for your pain.  I will also refer you to cardiology for chest pain.   Please be sure to bring all of your medications with you to every visit; this includes herbal supplements, vitamins, eye drops, and any over-the-counter medications.   Should you have any questions regarding your medications and/or any new or worsening symptoms, please be sure to call the clinic at [phone].   If you believe that you are suffering from a life threatening condition or one that may result in the loss of limb or function, then you should call 911 and proceed to the nearest Emergency Department.   A healthy lifestyle and preventative care can promote health and wellness.   Maintain regular health, dental, and eye exams.  Eat a healthy diet. Foods like vegetables, fruits, whole grains, low-fat dairy products, and lean protein foods contain the nutrients you need without too many calories. Decrease your intake of foods high in solid fats, added sugars, and salt. Get information about a proper diet from your caregiver, if necessary.  Regular physical exercise is one of the most important things you can do for your health. Most adults should get at least 150 minutes of moderate-intensity exercise (any activity that increases your heart rate and causes you to sweat) each week. In addition, most adults need muscle-strengthening exercises on 2 or more days a week.   Maintain a healthy weight. The body mass index (BMI) is a screening tool to identify possible weight problems. It provides an estimate of body fat based on height and weight. Your caregiver can help determine your BMI, and  can help you achieve or maintain a healthy weight. For adults 20 years and older:  A BMI below 18.5 is considered underweight.  A BMI of 18.5 to 24.9 is normal.  A BMI of 25 to 29.9 is considered overweight.  A BMI of 30 and above is considered obese.  You Can Quit Smoking If you are ready to quit smoking or are thinking about it, congratulations! You have chosen to help yourself be healthier and live longer! There are lots of different ways to quit smoking. Nicotine gum, nicotine patches, a nicotine inhaler, or nicotine nasal spray can help with physical craving. Hypnosis, support groups, and medicines help break the habit of smoking. TIPS TO GET OFF AND STAY OFF CIGARETTES  Learn to predict your moods. Do not let a bad situation be your excuse to have a cigarette. Some situations in your life might tempt you to have a cigarette.  Ask friends and co-workers not to smoke around you.  Make your home smoke-free.  Never have "just one" cigarette. It leads to wanting another and another. Remind yourself of your decision to quit.  On a card, make a list of your reasons for not smoking. Read it at least the same number of times a day as you have a cigarette. Tell yourself everyday, "I do not want to smoke. I choose not to smoke."  Ask someone at home or work to help you with your plan to quit smoking.  Have something planned after you eat or have a cup of  coffee. Take a walk or get other exercise to perk you up. This will help to keep you from overeating.  Try a relaxation exercise to calm you down and decrease your stress. Remember, you may be tense and nervous the first two weeks after you quit. This will pass.  Find new activities to keep your hands busy. Play with a pen, coin, or rubber band. Doodle or draw things on paper.  Brush your teeth right after eating. This will help cut down the craving for the taste of tobacco after meals. You can try mouthwash too.  Try gum, breath mints,  or diet candy to keep something in your mouth. IF YOU SMOKE AND WANT TO QUIT:  Do not stock up on cigarettes. Never buy a carton. Wait until one pack is finished before you buy another.  Never carry cigarettes with you at work or at home.  Keep cigarettes as far away from you as possible. Leave them with someone else.  Never carry matches or a lighter with you.  Ask yourself, "Do I need this cigarette or is this just a reflex?"  Bet with someone that you can quit. Put cigarette money in a piggy bank every morning. If you smoke, you give up the money. If you do not smoke, by the end of the week, you keep the money.  Keep trying. It takes 21 days to change a habit!  Talk to your doctor about using medicines to help you quit. These include nicotine replacement gum, lozenges, or skin patches.   This information is not intended to replace advice given to you by your health care provider. Make sure you discuss any questions you have with your health care provider.   Document Released: 10/13/2009 Document Revised: 03/10/2012 Document Reviewed: 10/13/2009 Elsevier Interactive Patient Education [employer].

## 2016-05-29 NOTE — Assessment & Plan Note (Addendum)
Unfortunately, he is still smoking, and is now up to 3 ppd in the setting of pain.  Discussed this would increase his risk of CVD.   -advised smoking cessation but he is not ready  -offered patches but he states "doesn't work" -encouraged gradual cessation

## 2016-05-29 NOTE — Assessment & Plan Note (Signed)
BP today 135/79, HR 82. -cont current meds

## 2016-05-29 NOTE — Assessment & Plan Note (Signed)
-  see A&P for chronic pain of left ankle

## 2016-05-30 NOTE — Progress Notes (Signed)
Internal Medicine Clinic Attending  Case discussed with Dr. Gill soon after the resident saw the patient.  We reviewed the resident's history and exam and pertinent patient test results.  I agree with the assessment, diagnosis, and plan of care documented in the resident's note.  

## 2016-05-30 NOTE — Assessment & Plan Note (Signed)
He c/o weak stream and difficulty initiating urination.  Also c/o "kidney pain."  He is currently on doxazosin 4mg  bid but his pharmacy did not have this dose and it was changed to 8mg  in the AM.  He is on the maximum dose which is 8mg  daily.  We discussed side effects of hypotension.  We could consider adding another agent but he is going to try the 8mg  dose in the AM. -cont doxazosin 8mg  in AM  -prostate exam at next OV  -need to stress the importance of behavior modifications including avoiding fluids prior to bedtime/going out, reducing caffeine, and double voiding  -may need urology referral if symptoms persist

## 2016-05-30 NOTE — Addendum Note (Signed)
Addended by: Lalla Brothers T on: 05/30/2016 02:32 PM   Modules accepted: Level of Service, SmartSet

## 2016-06-06 ENCOUNTER — Ambulatory Visit: Payer: Self-pay | Admitting: Internal Medicine

## 2016-06-08 ENCOUNTER — Encounter: Payer: Self-pay | Admitting: *Deleted

## 2016-06-13 ENCOUNTER — Ambulatory Visit (INDEPENDENT_AMBULATORY_CARE_PROVIDER_SITE_OTHER): Payer: Self-pay | Admitting: Cardiovascular Disease

## 2016-06-13 ENCOUNTER — Encounter: Payer: Self-pay | Admitting: Cardiovascular Disease

## 2016-06-13 DIAGNOSIS — E785 Hyperlipidemia, unspecified: Secondary | ICD-10-CM

## 2016-06-13 DIAGNOSIS — R079 Chest pain, unspecified: Secondary | ICD-10-CM

## 2016-06-13 DIAGNOSIS — I10 Essential (primary) hypertension: Secondary | ICD-10-CM

## 2016-06-13 DIAGNOSIS — I82402 Acute embolism and thrombosis of unspecified deep veins of left lower extremity: Secondary | ICD-10-CM

## 2016-06-13 DIAGNOSIS — Z79899 Other long term (current) drug therapy: Secondary | ICD-10-CM

## 2016-06-13 DIAGNOSIS — M7989 Other specified soft tissue disorders: Secondary | ICD-10-CM

## 2016-06-13 NOTE — Assessment & Plan Note (Signed)
History of intermittent chest pain occurring several times per week. He does have risk factors including treated hypertension, hyperlipidemia and tobacco abuse. We will get a pharmacologic Myoview stress test to rule out an ischemic etiology. 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

## 2016-06-13 NOTE — Assessment & Plan Note (Signed)
Ongoing tobacco abuse of one pack per day down from 2 packs per day. He has an 80-pack-year history.

## 2016-06-13 NOTE — Assessment & Plan Note (Signed)
istory of dyslipidemia on statin therapy. We will recheck a lipid and liver profile

## 2016-06-13 NOTE — Progress Notes (Signed)
06/13/2016 WALTHER GRAETZ   19-May-1960  [id number]  Primary Physician Michail Jewels, MD Primary Cardiologist: Lorretta Harp MD Renae Gloss  HPI:  [name] is a 56 year old mildly overweight separated Caucasian male father of one son who currently is out of work. He is referred by his primary care providerfor cardiovascular evaluation because of left leg pain as well as chest pain. His cardiovascular risk factor profile is notable for 80 pack years of tobacco abuse currently smoking one pack per day recalcitrant to resect or modification. He does have a history of treated hypertension and hyperlipidemia. He has never had a heart attack or stroke. This intermittent chest pain. He complains of left leg discomfort and swelling and had a negative arterial Dopplers performed last month.   Current Outpatient Prescriptions  Medication Sig Dispense Refill  . albuterol (PROVENTIL HFA;VENTOLIN HFA) 108 (90 BASE) MCG/ACT inhaler Inhale 2 puffs into the lungs every 6 (six) hours as needed for wheezing or shortness of breath. 1 Inhaler 0  . Cholecalciferol (VITAMIN D-3) 1000 units CAPS Take 1 capsule by mouth daily.    . cyclobenzaprine (FLEXERIL) 10 MG tablet Take 1 tablet (10 mg total) by mouth every 8 (eight) hours as needed for muscle spasms. 30 tablet 1  . diclofenac sodium (VOLTAREN) 1 % GEL Apply 4 g topically 2 (two) times daily. 1 Tube 5  . doxazosin (CARDURA) 4 MG tablet Take 1 tablet (4 mg total) by mouth 2 (two) times daily. 180 tablet 0  . guaifenesin (HUMIBID E) 400 MG TABS tablet Take 400 mg by mouth 2 (two) times daily.    Marland Kitchen ibuprofen (ADVIL,MOTRIN) 200 MG tablet Take 200 mg by mouth every 6 (six) hours as needed.    Marland Kitchen lisinopril (PRINIVIL,ZESTRIL) 10 MG tablet Take 1 tablet (10 mg total) by mouth daily. 90 tablet 1  . loperamide (IMODIUM) 2 MG capsule Take 2 mg by mouth as needed for diarrhea or loose stools.    Marland Kitchen loratadine (CLARITIN) 10 MG tablet Take 10 mg by  mouth 2 (two) times daily.    Marland Kitchen omeprazole (PRILOSEC) 20 MG capsule TAKE ONE CAPSULE BY MOUTH DAILY AS NEEDED 90 capsule 1  . pravastatin (PRAVACHOL) 40 MG tablet Take 1 tablet (40 mg total) by mouth at bedtime. 90 tablet 1   No current facility-administered medications for this visit.    Allergies  Allergen Reactions  . Codeine     REACTION: Unknown reaction  . Penicillins     Hives and Swelling. Has patient had a PCN reaction causing immediate rash, facial/tongue/throat swelling, SOB or lightheadedness with hypotension:YES Has patient had a PCN reaction causing severe rash involving mucus membranes or skin necrosis: NO Has patient had a PCN reaction that required hospitalization NO Has patient had a PCN reaction occurring within the last 10 years: No If all of the above answers are "NO", then may proceed with Cephalosporin use.    Social History   Social History  . Marital Status: Legally Separated    Spouse Name: N/A  . Number of Children: N/A  . Years of Education: N/A   Occupational History  . Not on file.   Social History Main Topics  . Smoking status: Current Every Day Smoker -- 2.00 packs/day for 40 years    Types: Cigarettes  . Smokeless tobacco: Not on file     Comment: DOWN TO 1.5 PER DAY SOMETIMES.  Marland Kitchen Alcohol Use: No     Comment: Quit  drinking in 1988  . Drug Use: Not on file  . Sexual Activity: Not on file   Other Topics Concern  . Not on file   Social History Narrative     Review of Systems: General: negative for chills, fever, night sweats or weight changes.  Cardiovascular: negative for chest pain, dyspnea on exertion, edema, orthopnea, palpitations, paroxysmal nocturnal dyspnea or shortness of breath Dermatological: negative for rash Respiratory: negative for cough or wheezing Urologic: negative for hematuria Abdominal: negative for nausea, vomiting, diarrhea, bright red blood per rectum, melena, or hematemesis Neurologic: negative for visual  changes, syncope, or dizziness All other systems reviewed and are otherwise negative except as noted above.    Blood pressure 130/80, pulse 81, height 5\' 9"  (1.753 m), weight 212 lb (96.163 kg).  General appearance: alert and no distress Neck: no adenopathy, no carotid bruit, no JVD, supple, symmetrical, trachea midline and thyroid not enlarged, symmetric, no tenderness/mass/nodules Lungs: clear to auscultation bilaterally Heart: regular rate and rhythm, S1, S2 normal, no murmur, click, rub or gallop Extremities: extremities normal, atraumatic, no cyanosis or edema  EKG not performed today  ASSESSMENT AND PLAN:   TOBACCO ABUSE Ongoing tobacco abuse of one pack per day down from 2 packs per day. He has an 80-pack-year history.  Dyslipidemia istory of dyslipidemia on statin therapy. We will recheck a lipid and liver profile  Essential hypertension History of hypertension blood pressure measured 130/80. He is on lisinopril and Cardura (for BPH). Continue current meds at current dosing  Left leg swelling History of left lower extremity swelling/pain. Recent arterial Doppler studies performed 05/08/16 revealed normal ABIs and waveforms. I am going to get venous Doppler studies to rule out DVT.  Chest pain History of intermittent chest pain occurring several times per week. He does have risk factors including treated hypertension, hyperlipidemia and tobacco abuse. We will get a pharmacologic Myoview stress test to rule out an ischemic etiology. 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      Lorretta Harp MD FACP,FACC,FAHA, [facility] 06/13/2016 9:34 AM

## 2016-06-13 NOTE — Patient Instructions (Addendum)
Medication Instructions:  Your physician recommends that you continue on your current medications as directed. Please refer to the Current Medication list given to you today.   Labwork: Your physician recommends that you return for lab work AT [facility]. NEED TO BE FASTING. The lab can be found on the FIRST FLOOR of out building in [address]   Testing/Procedures: Your physician has requested that you have a LEFT lower  extremity venous duplex. This test is an ultrasound of the veins in the legs or arms. It looks at venous blood flow that carries blood from the heart to the legs or arms. Allow one hour for a Lower Venous exam. Allow thirty minutes for an Upper Venous exam. There are no restrictions or special instructions.  AS SOON AS POSSIBLE.  Your physician has requested that you have a lexiscan myoview. For further information please visit [email or web address]. Please follow instruction sheet, as given.    Follow-Up: Your physician wants you to follow-up in: [place]. You will receive a reminder letter in the mail two months in advance. If you don't receive a letter, please call our office to schedule the follow-up appointment.   Any Other Special Instructions Will Be Listed Below (If Applicable).     If you need a refill on your cardiac medications before your next appointment, please call your pharmacy.

## 2016-06-13 NOTE — Assessment & Plan Note (Signed)
History of left lower extremity swelling/pain. Recent arterial Doppler studies performed 05/08/16 revealed normal ABIs and waveforms. I am going to get venous Doppler studies to rule out DVT.

## 2016-06-13 NOTE — Assessment & Plan Note (Signed)
History of hypertension blood pressure measured 130/80. He is on lisinopril and Cardura (for BPH). Continue current meds at current dosing

## 2016-06-14 ENCOUNTER — Ambulatory Visit (HOSPITAL_COMMUNITY)
Admission: RE | Admit: 2016-06-14 | Discharge: 2016-06-14 | Disposition: A | Discharge status: Home / Self Care | Payer: No Typology Code available for payment source | Source: Ambulatory Visit | Attending: Internal Medicine | Admitting: Internal Medicine

## 2016-06-14 DIAGNOSIS — I82402 Acute embolism and thrombosis of unspecified deep veins of left lower extremity: Secondary | ICD-10-CM

## 2016-06-14 DIAGNOSIS — I10 Essential (primary) hypertension: Secondary | ICD-10-CM

## 2016-06-14 DIAGNOSIS — Z79899 Other long term (current) drug therapy: Secondary | ICD-10-CM

## 2016-06-14 DIAGNOSIS — M7989 Other specified soft tissue disorders: Secondary | ICD-10-CM

## 2016-06-14 DIAGNOSIS — E785 Hyperlipidemia, unspecified: Secondary | ICD-10-CM

## 2016-06-14 DIAGNOSIS — R079 Chest pain, unspecified: Secondary | ICD-10-CM

## 2016-06-14 DIAGNOSIS — M79605 Pain in left leg: Secondary | ICD-10-CM

## 2016-06-18 ENCOUNTER — Other Ambulatory Visit (INDEPENDENT_AMBULATORY_CARE_PROVIDER_SITE_OTHER): Payer: No Typology Code available for payment source

## 2016-06-18 DIAGNOSIS — I10 Essential (primary) hypertension: Secondary | ICD-10-CM

## 2016-06-18 DIAGNOSIS — Z79899 Other long term (current) drug therapy: Secondary | ICD-10-CM

## 2016-06-18 DIAGNOSIS — E785 Hyperlipidemia, unspecified: Secondary | ICD-10-CM

## 2016-06-19 ENCOUNTER — Telehealth (HOSPITAL_COMMUNITY): Payer: Self-pay

## 2016-06-19 LAB — LIPID PANEL
Chol/HDL Ratio: 4.2 ratio units (ref 0.0–5.0)
Cholesterol, Total: 143 mg/dL (ref 100–199)
HDL: 34 mg/dL — ABNORMAL LOW (ref >39)
LDL Calculated: 80 mg/dL (ref 0–99)
Triglycerides: 147 mg/dL (ref 0–149)
VLDL Cholesterol Cal: 29 mg/dL (ref 5–40)

## 2016-06-19 LAB — HEPATIC FUNCTION PANEL
ALT: 41 IU/L (ref 0–44)
AST: 27 IU/L (ref 0–40)
Albumin: 4 g/dL (ref 3.5–5.5)
Alkaline Phosphatase: 74 IU/L (ref 39–117)
BILIRUBIN, DIRECT: 0.11 mg/dL (ref 0.00–0.40)
Bilirubin Total: 0.3 mg/dL (ref 0.0–1.2)
Total Protein: 6.2 g/dL (ref 6.0–8.5)

## 2016-06-19 NOTE — Telephone Encounter (Signed)
Encounter complete. 

## 2016-06-20 ENCOUNTER — Telehealth: Payer: Self-pay | Admitting: Cardiovascular Disease

## 2016-06-20 NOTE — Telephone Encounter (Signed)
New message   Pt is calling to speak to rn about what he can and cant drink and eat

## 2016-06-21 ENCOUNTER — Ambulatory Visit (HOSPITAL_COMMUNITY)
Admission: RE | Admit: 2016-06-21 | Discharge: 2016-06-21 | Disposition: A | Discharge status: Home / Self Care | Payer: No Typology Code available for payment source | Source: Ambulatory Visit | Attending: Cardiovascular Disease | Admitting: Cardiovascular Disease

## 2016-06-21 DIAGNOSIS — E119 Type 2 diabetes mellitus without complications: Secondary | ICD-10-CM | POA: Insufficient documentation

## 2016-06-21 DIAGNOSIS — I82402 Acute embolism and thrombosis of unspecified deep veins of left lower extremity: Secondary | ICD-10-CM

## 2016-06-21 DIAGNOSIS — R079 Chest pain, unspecified: Secondary | ICD-10-CM | POA: Insufficient documentation

## 2016-06-21 DIAGNOSIS — Z6831 Body mass index (BMI) 31.0-31.9, adult: Secondary | ICD-10-CM | POA: Insufficient documentation

## 2016-06-21 DIAGNOSIS — Z79899 Other long term (current) drug therapy: Secondary | ICD-10-CM | POA: Insufficient documentation

## 2016-06-21 DIAGNOSIS — E785 Hyperlipidemia, unspecified: Secondary | ICD-10-CM | POA: Insufficient documentation

## 2016-06-21 DIAGNOSIS — R0609 Other forms of dyspnea: Secondary | ICD-10-CM | POA: Insufficient documentation

## 2016-06-21 DIAGNOSIS — I10 Essential (primary) hypertension: Secondary | ICD-10-CM | POA: Insufficient documentation

## 2016-06-21 DIAGNOSIS — R5383 Other fatigue: Secondary | ICD-10-CM | POA: Insufficient documentation

## 2016-06-21 DIAGNOSIS — Z8249 Family history of ischemic heart disease and other diseases of the circulatory system: Secondary | ICD-10-CM | POA: Insufficient documentation

## 2016-06-21 DIAGNOSIS — E663 Overweight: Secondary | ICD-10-CM | POA: Insufficient documentation

## 2016-06-21 DIAGNOSIS — R42 Dizziness and giddiness: Secondary | ICD-10-CM | POA: Insufficient documentation

## 2016-06-21 DIAGNOSIS — Z720 Tobacco use: Secondary | ICD-10-CM | POA: Insufficient documentation

## 2016-06-21 DIAGNOSIS — R0602 Shortness of breath: Secondary | ICD-10-CM | POA: Insufficient documentation

## 2016-06-21 LAB — MYOCARDIAL PERFUSION IMAGING
CHL CUP NUCLEAR SDS: 1
CHL CUP NUCLEAR SRS: 0
CHL CUP RESTING HR STRESS: 73 {beats}/min
LV sys vol: 42 mL
LVDIAVOL: 100 mL (ref 62–150)
Peak HR: 101 {beats}/min
SSS: 1
TID: 1.32

## 2016-06-21 MED ORDER — REGADENOSON 0.4 MG/5ML IV SOLN
0.4000 mg | Freq: Once | INTRAVENOUS | Status: AC
  Administered 2016-06-21: 0.4 mg via INTRAVENOUS

## 2016-06-21 MED ORDER — AMINOPHYLLINE 25 MG/ML IV SOLN
75.0000 mg | Freq: Once | INTRAVENOUS | Status: AC
  Administered 2016-06-21: 75 mg via INTRAVENOUS

## 2016-06-21 MED ORDER — TECHNETIUM TC 99M TETROFOSMIN IV KIT
31.8000 | PACK | Freq: Once | INTRAVENOUS | Status: AC | PRN
  Administered 2016-06-21: 31.8 via INTRAVENOUS
  Filled 2016-06-21: qty 32

## 2016-06-21 MED ORDER — TECHNETIUM TC 99M TETROFOSMIN IV KIT
9.8000 | PACK | Freq: Once | INTRAVENOUS | Status: AC | PRN
  Administered 2016-06-21: 10 via INTRAVENOUS
  Filled 2016-06-21: qty 10

## 2016-07-09 ENCOUNTER — Encounter: Payer: Self-pay | Admitting: Internal Medicine

## 2016-07-09 ENCOUNTER — Ambulatory Visit (INDEPENDENT_AMBULATORY_CARE_PROVIDER_SITE_OTHER): Payer: No Typology Code available for payment source | Admitting: Internal Medicine

## 2016-07-09 VITALS — BP 155/83 | HR 98 | Temp 98.1°F | Ht 69.0 in | Wt 213.8 lb

## 2016-07-09 DIAGNOSIS — G8929 Other chronic pain: Secondary | ICD-10-CM

## 2016-07-09 DIAGNOSIS — M25572 Pain in left ankle and joints of left foot: Secondary | ICD-10-CM

## 2016-07-09 MED ORDER — DICLOFENAC SODIUM 75 MG PO TBEC: 75.0000 mg | DELAYED_RELEASE_TABLET | Freq: Two times a day (BID) | ORAL | Status: DC

## 2016-07-09 NOTE — Progress Notes (Signed)
Internal Medicine Clinic Attending  Case discussed with Dr. Wallace at the time of the visit.  We reviewed the resident's history and exam and pertinent patient test results.  I agree with the assessment, diagnosis, and plan of care documented in the resident's note.  

## 2016-07-09 NOTE — Patient Instructions (Signed)
We will try to get you in to see sports medicine ASAP.  I will give you a prescription for Diclofenac 75mg  twice daily.  Go to [facility] to pick it up.  STOP using ibuprofen on this medication.  You can still use Tyelenol.

## 2016-07-09 NOTE — Assessment & Plan Note (Signed)
A: Patient continues to have significant pain in his left ankle that is not any worse today, but describes as constant, 10/10, and worsened with movement.  Current pain relief regimen includes ibuprofen 800mg  TID to QID plus Tylenol.  He is also using voltaren gel with mild, temporary relief.  He continues to use Prilosec while taking NSAIDs.  With his smoking history and PVD risk, it is reassuring that he has recently had normal ABI's and normal LE dopplers.  P: - attempt to expedite sports medicine referral from May.  Nurse Teressa Senter was able to call over to Sports Med clinic and get him an appointment. - discontinue ibuprofen - Rx given for Diclofenac 75mg  BID - continue PPI - continue topical treatment - can continue tylenol as needed as well - follow up with PCP

## 2016-07-09 NOTE — Progress Notes (Signed)
Patient ID: [name] Acosta, male   DOB: 11-26-1960, 56 y.o.   MRN: [id number]   CC: left ankle pain  HPI:  [name] is a 56 y.o. man with pmhx as below presenting today for follow up of his chronic left ankle pain.  Please see A&P for status of today's visit.  Past Medical History  Diagnosis Date  . Hyperlipidemia   . Hypertension   . Renal calculus   . Prostate disorder     Review of Systems:   Review of Systems  Constitutional: Negative for fever and chills.  Gastrointestinal: Negative for nausea, vomiting and abdominal pain.  Musculoskeletal: Positive for joint pain. Negative for falls.     Physical Exam:  Filed Vitals:   07/09/16 1424  BP: 155/83  Pulse: 98  Temp: 98.1 F (36.7 C)  TempSrc: Oral  Height: 5\' 9"  (1.753 m)  Weight: 213 lb 12.8 oz (96.979 kg)  SpO2: 100%   Physical Exam  Constitutional:  Well-developed, well-nourished, uncomfortable due to pain.  HENT:  Head: Normocephalic and atraumatic.  Eyes: EOM are normal.  Pulmonary/Chest: Effort normal.  Musculoskeletal:  Decreased ROM with left ankle plantar and dorsiflexion.   Mild swelling bilaterally of both feet with L>R and small area of ecchymosis on lateral aspect of left foot.   Multiple small ulcers with clear fluid draining on left leg.     Assessment & Plan:   See encounters tab for problem based medical decision making.   Patient discussed with Dr. Angelia Mould  Chronic pain of left ankle A: Patient continues to have significant pain in his left ankle that is not any worse today, but describes as constant, 10/10, and worsened with movement.  Current pain relief regimen includes ibuprofen 800mg  TID to QID plus Tylenol.  He is also using voltaren gel with mild, temporary relief.  He continues to use Prilosec while taking NSAIDs.  With his smoking history and PVD risk, it is reassuring that he has recently had normal ABI's and normal LE dopplers.  P: - attempt to expedite sports  medicine referral from May.  Nurse Teressa Senter was able to call over to Sports Med clinic and get him an appointment. - discontinue ibuprofen - Rx given for Diclofenac 75mg  BID - continue PPI - continue topical treatment - can continue tylenol as needed as well - follow up with PCP

## 2016-07-18 ENCOUNTER — Ambulatory Visit (INDEPENDENT_AMBULATORY_CARE_PROVIDER_SITE_OTHER): Payer: Self-pay | Admitting: Sports Medicine

## 2016-07-18 ENCOUNTER — Encounter: Payer: Self-pay | Admitting: Sports Medicine

## 2016-07-18 ENCOUNTER — Other Ambulatory Visit: Payer: Self-pay | Admitting: Sports Medicine

## 2016-07-18 VITALS — BP 136/72 | HR 77 | Ht 69.0 in | Wt 213.0 lb

## 2016-07-18 DIAGNOSIS — G8929 Other chronic pain: Secondary | ICD-10-CM

## 2016-07-18 DIAGNOSIS — T1590XA Foreign body on external eye, part unspecified, unspecified eye, initial encounter: Secondary | ICD-10-CM

## 2016-07-18 DIAGNOSIS — M25572 Pain in left ankle and joints of left foot: Secondary | ICD-10-CM
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
## 2016-07-27 ENCOUNTER — Ambulatory Visit
Admission: RE | Admit: 2016-07-27 | Discharge: 2016-07-27 | Disposition: A | Discharge status: Home / Self Care | Payer: No Typology Code available for payment source | Source: Ambulatory Visit | Attending: Sports Medicine | Admitting: Sports Medicine

## 2016-07-27 DIAGNOSIS — T1590XA Foreign body on external eye, part unspecified, unspecified eye, initial encounter: Secondary | ICD-10-CM

## 2016-07-27 DIAGNOSIS — M25572 Pain in left ankle and joints of left foot: Principal | ICD-10-CM

## 2016-07-27 DIAGNOSIS — G8929 Other chronic pain: Secondary | ICD-10-CM

## 2016-08-01 ENCOUNTER — Telehealth: Payer: Self-pay | Admitting: Sports Medicine

## 2016-08-01 NOTE — Telephone Encounter (Signed)
I spoke with the patient on the phone today after reviewing MRI of his left ankle. There is evidence of his previous lateral ankle reconstruction with some mild underlying ankle osteoarthritis. He also has some diffuse nonspecific edema. I recommended consultation with Dr. Sharol Given to discuss further treatment options. I will defer further workup and treatment to the discretion of Dr.Duda and the patient will follow-up with me as needed.

## 2016-08-02 NOTE — Telephone Encounter (Signed)
[facility] Dr. Sharol Given Monday 08/06/16 at [address], [place], [place] [address] Phone: [phone]  Pt informed

## 2016-08-07 ENCOUNTER — Other Ambulatory Visit: Payer: Self-pay | Admitting: *Deleted

## 2016-08-10 MED ORDER — PRAVASTATIN SODIUM 40 MG PO TABS: 40.0000 mg | ORAL_TABLET | Freq: Every day | ORAL | 1 refills | Status: DC

## 2016-08-10 MED ORDER — LISINOPRIL 10 MG PO TABS: 10.0000 mg | ORAL_TABLET | Freq: Every day | ORAL | 1 refills | Status: DC

## 2016-09-06 ENCOUNTER — Ambulatory Visit: Payer: No Typology Code available for payment source

## 2016-09-25 ENCOUNTER — Telehealth: Payer: Self-pay | Admitting: Internal Medicine

## 2016-09-25 NOTE — Telephone Encounter (Signed)
REMINDER CALL, IT IS TIME TO RENEW GCCN CARD, LMTCB °

## 2016-10-17 ENCOUNTER — Other Ambulatory Visit: Payer: Self-pay | Admitting: Internal Medicine

## 2016-10-26 ENCOUNTER — Other Ambulatory Visit: Payer: Self-pay

## 2016-10-26 MED ORDER — DOXAZOSIN MESYLATE 4 MG PO TABS: 4.0000 mg | ORAL_TABLET | Freq: Two times a day (BID) | ORAL | 1 refills | Status: DC

## 2016-10-26 MED ORDER — OMEPRAZOLE 20 MG PO CPDR: DELAYED_RELEASE_CAPSULE | ORAL | 2 refills | Status: DC

## 2016-10-26 NOTE — Telephone Encounter (Signed)
omeprazole (PRILOSEC) 20 MG capsule  doxazosin (CARDURA) 4 MG tablet, refill request @ [place].

## 2017-01-21 ENCOUNTER — Other Ambulatory Visit: Payer: Self-pay | Admitting: Internal Medicine

## 2017-02-21 IMAGING — NM NM MISC PROCEDURE
6 series · 36 of 36 positions shown · non-contrast
Comparison: none

[Series 1: wbr_r-proj_st wbr rest · 6.40mm/px · 6 of 64 frames shown]
[frame 6/64]
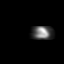
[frame 16/64]
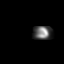
[frame 27/64]
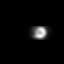
[frame 38/64]
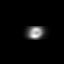
[frame 48/64]
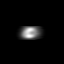
[frame 59/64]
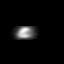

[Series 1: wbr rest · 6.40mm/px · 6 of 64 frames shown]
[frame 6/64]
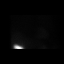
[frame 16/64]
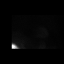
[frame 27/64]
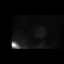
[frame 38/64]
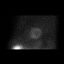
[frame 48/64]
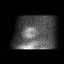
[frame 59/64]
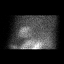

[Series 2: wbr_s-proj_st wbr stress-gsp · 6.40mm/px · 6 of 512 frames shown]
[frame 43/512]
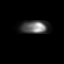
[frame 128/512]
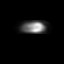
[frame 214/512]
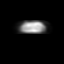
[frame 299/512]
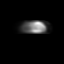
[frame 384/512]
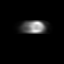
[frame 470/512]
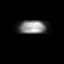

[Series 2: wbr stress-gsp · 6.40mm/px · 6 of 512 frames shown]
[frame 43/512]
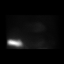
[frame 128/512]
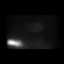
[frame 214/512]
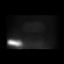
[frame 299/512]
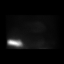
[frame 384/512]
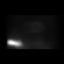
[frame 470/512]
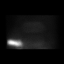

[Series 3: wbr_s-proj_st wbr stress-sum-em · 6.40mm/px · 6 of 64 frames shown]
[frame 6/64]
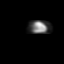
[frame 16/64]
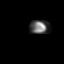
[frame 27/64]
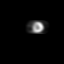
[frame 38/64]
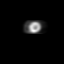
[frame 48/64]
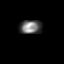
[frame 59/64]
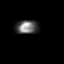

[Series 3: wbr stress-sum-em · 6.40mm/px · 6 of 64 frames shown]
[frame 6/64]
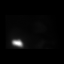
[frame 16/64]
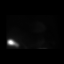
[frame 27/64]
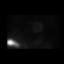
[frame 38/64]
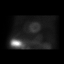
[frame 48/64]
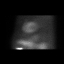
[frame 59/64]
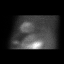

[36 of 36 positions shown; findings below may reference images not displayed]

Canned report from images found in remote index.

Refer to host system for actual result text.

## 2017-06-10 ENCOUNTER — Encounter: Payer: Self-pay | Admitting: *Deleted

## 2017-11-05 ENCOUNTER — Telehealth: Payer: Self-pay

## 2017-11-05 NOTE — Telephone Encounter (Signed)
Pt called stating he needs his meds to be filled by today. Pt was yelling and using profanity language at me, stating "You don't do a damn thing about my pain and fuck you." Offered patient an appt, but patient keep on yelling and cursing at me. Pt states "You better do something about my pain, or I'm about to be angry".

## 2017-11-06 ENCOUNTER — Other Ambulatory Visit: Payer: Self-pay

## 2017-11-06 ENCOUNTER — Ambulatory Visit (INDEPENDENT_AMBULATORY_CARE_PROVIDER_SITE_OTHER): Payer: Self-pay | Admitting: Internal Medicine

## 2017-11-06 DIAGNOSIS — I10 Essential (primary) hypertension: Secondary | ICD-10-CM

## 2017-11-06 DIAGNOSIS — F17210 Nicotine dependence, cigarettes, uncomplicated: Secondary | ICD-10-CM

## 2017-11-06 DIAGNOSIS — E785 Hyperlipidemia, unspecified: Secondary | ICD-10-CM

## 2017-11-06 DIAGNOSIS — Z79899 Other long term (current) drug therapy: Secondary | ICD-10-CM

## 2017-11-06 DIAGNOSIS — G8929 Other chronic pain: Secondary | ICD-10-CM

## 2017-11-06 DIAGNOSIS — K219 Gastro-esophageal reflux disease without esophagitis: Secondary | ICD-10-CM

## 2017-11-06 DIAGNOSIS — M170 Bilateral primary osteoarthritis of knee: Secondary | ICD-10-CM

## 2017-11-06 MED ORDER — LISINOPRIL 10 MG PO TABS: 10.0000 mg | ORAL_TABLET | Freq: Every day | ORAL | 2 refills | Status: DC

## 2017-11-06 MED ORDER — RANITIDINE HCL 300 MG PO TABS: 300.0000 mg | ORAL_TABLET | Freq: Every day | ORAL | 0 refills | Status: DC

## 2017-11-06 MED ORDER — PRAVASTATIN SODIUM 20 MG PO TABS: 20.0000 mg | ORAL_TABLET | Freq: Every day | ORAL | 2 refills | Status: DC

## 2017-11-06 MED ORDER — DOXAZOSIN MESYLATE 4 MG PO TABS: ORAL_TABLET | ORAL | 2 refills | Status: DC

## 2017-11-06 NOTE — Assessment & Plan Note (Signed)
Pravastatin was decreased from 40mg  to 20mg  as patient could not tolerate the muscle cramps. Please monitor and titrate as needed.

## 2017-11-06 NOTE — Assessment & Plan Note (Signed)
Refilled patients antihypertensive medications, lisinopril and *doxazosin* that he wishes to continue. Please consider discussing alternatives if patient is amenable at his next PCP visit.

## 2017-11-06 NOTE — Progress Notes (Signed)
   CC: Bilateral knee pain and medication refill  HPI:  [name] is a 57 y.o. male who presented today for evaluation of his bilateral knee pain and medication refills. 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 He denied redness or swelling of the joints or other affected joints. Patient also denied fever, chills, myalgias, nausea, vomiting, diarrhea, or other concerning symptoms.  In addition, the patient is requesting multiple refills at this time.   Past Medical History:  Diagnosis Date  . Hyperlipidemia   . Hypertension   . Prostate disorder   . Renal calculus    Review of Systems:  ROS negative except as per HPI.  Physical Exam:  Vitals:   11/06/17 1444  BP: (!) 132/93  Pulse: (!) 102  Temp: 97.7 F (36.5 C)  TempSrc: Oral  SpO2: 98%  Weight: 198 lb 14.4 oz (90.2 kg)  Height: 5\' 9"  (1.753 m)   Physical Exam  Constitutional: He appears well-developed and well-nourished. No distress.  Cardiovascular: Normal rate and regular rhythm.  Pulmonary/Chest: Effort normal and breath sounds normal.  Abdominal: Soft. Bowel sounds are normal. He exhibits no distension. There is no tenderness.  Musculoskeletal: He exhibits tenderness (To palpaiton of the patients kness bilaterally in the medial and lateral aspects near the tibiofemoral joint).    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 Consent was obtained for the procedure. The joint was prepped with Betadine. A 23 gauge needle was inserted into the anterior aspect of the joint from a medial approach to access  the suprapatellar pouch. 0 ml of no fluid was removed from the joint. 2 ml of 1% lidocaine and 1 ml of Triamcinolone was then injected into each tibiofemoral joint respectively utilizing different syringes and needles. The needles were removed and the area cleansed, the patient refused dressing. Watch for fever, or increased swelling or persistent pain in the joint. Call or return to clinic prn if such symptoms occur or there is failure to improve as anticipated.  Complications:  None; patient tolerated the procedure well.  Assessment & Plan:   See Encounters Tab for problem based charting.  Patient seen with Dr. Angelia Mould

## 2017-11-06 NOTE — Assessment & Plan Note (Addendum)
Patient no longer desires to take a PPI as he stated he read that it may cause gastric cancer. This could not be completely denied as [employer] lists the long term use of the medication as a possible contributor to certain gastric cancers. The patient was instead started on ranitidine for his GERD as his symptoms have reoccurred since stopping the PPI>

## 2017-11-06 NOTE — Patient Instructions (Addendum)
I have refilled your medications as we discussed. Please take them as directed and call with any concerns or questions.  We injected both knees with the corticosteroids and lidocaine solution to assist with the knee pain. I would recommend not consuming large amounts of NSAID such as ibuprofen as they can cause stomach bleeds leading to dark and or tarry stool.   Please call us if you develop redness or swelling the knees, fever, or chills, or other concerning symptoms.  Thank you for your visit to [facility].

## 2017-11-06 NOTE — Assessment & Plan Note (Signed)
Assessment: Bilateral knee pain. Patient has received injeciton in the past. After asking if he was amenable to injections today to prevent such heavy reliance on NSAIDS he agreed. He requested that we inject both knees given the level of pain and stiffness he is experiencing.  Patient is unable to flex his knees after sitting for short periods of time. He would be a good candidate for imaging at his next appointment with his PCP, which he is yet to meet.  Plan: Patient advised to decrease his intake of ibuprofen from >1200mg  daily to less than 200-400 mg daily and only as needed for pain and not every day. As per note, patient received bilateral knee injections for symptomatic relief. He tolerated the procedure well without concern.

## 2017-11-11 NOTE — Progress Notes (Signed)
Internal Medicine Clinic Attending  I saw and evaluated the patient.  I personally confirmed the key portions of the history and exam documented by Dr. Berline Lopes and I reviewed pertinent patient test results.  The assessment, diagnosis, and plan were formulated together and I agree with the documentation in the resident's note.  I was present for both knee injections.

## 2018-06-04 ENCOUNTER — Encounter: Payer: Self-pay | Admitting: Internal Medicine

## 2018-06-04 ENCOUNTER — Other Ambulatory Visit: Payer: Self-pay

## 2018-06-04 ENCOUNTER — Ambulatory Visit (INDEPENDENT_AMBULATORY_CARE_PROVIDER_SITE_OTHER): Payer: Self-pay | Admitting: Internal Medicine

## 2018-06-04 VITALS — BP 134/79 | HR 107 | Temp 97.8°F | Ht 69.0 in | Wt 218.1 lb

## 2018-06-04 DIAGNOSIS — Z79899 Other long term (current) drug therapy: Secondary | ICD-10-CM

## 2018-06-04 DIAGNOSIS — Z8719 Personal history of other diseases of the digestive system: Secondary | ICD-10-CM

## 2018-06-04 DIAGNOSIS — R197 Diarrhea, unspecified: Secondary | ICD-10-CM

## 2018-06-04 DIAGNOSIS — R311 Benign essential microscopic hematuria: Secondary | ICD-10-CM

## 2018-06-04 DIAGNOSIS — R319 Hematuria, unspecified: Secondary | ICD-10-CM

## 2018-06-04 DIAGNOSIS — R945 Abnormal results of liver function studies: Secondary | ICD-10-CM

## 2018-06-04 DIAGNOSIS — Z791 Long term (current) use of non-steroidal anti-inflammatories (NSAID): Secondary | ICD-10-CM

## 2018-06-04 DIAGNOSIS — R0609 Other forms of dyspnea: Secondary | ICD-10-CM

## 2018-06-04 DIAGNOSIS — M170 Bilateral primary osteoarthritis of knee: Secondary | ICD-10-CM

## 2018-06-04 DIAGNOSIS — E118 Type 2 diabetes mellitus with unspecified complications: Secondary | ICD-10-CM

## 2018-06-04 DIAGNOSIS — F17210 Nicotine dependence, cigarettes, uncomplicated: Secondary | ICD-10-CM

## 2018-06-04 DIAGNOSIS — I10 Essential (primary) hypertension: Secondary | ICD-10-CM

## 2018-06-04 DIAGNOSIS — Z9889 Other specified postprocedural states: Secondary | ICD-10-CM

## 2018-06-04 DIAGNOSIS — R600 Localized edema: Secondary | ICD-10-CM

## 2018-06-04 DIAGNOSIS — E119 Type 2 diabetes mellitus without complications: Secondary | ICD-10-CM

## 2018-06-04 DIAGNOSIS — K921 Melena: Secondary | ICD-10-CM

## 2018-06-04 DIAGNOSIS — Z0000 Encounter for general adult medical examination without abnormal findings: Secondary | ICD-10-CM

## 2018-06-04 LAB — BRAIN NATRIURETIC PEPTIDE: B Natriuretic Peptide: 12.2 pg/mL (ref 0.0–100.0)

## 2018-06-04 LAB — POCT GLYCOSYLATED HEMOGLOBIN (HGB A1C): HEMOGLOBIN A1C: 7.1 % — AB (ref 4.0–5.6)

## 2018-06-04 LAB — GLUCOSE, CAPILLARY: GLUCOSE-CAPILLARY: 216 mg/dL — AB (ref 65–99)

## 2018-06-04 MED ORDER — FUROSEMIDE 20 MG PO TABS: 20.0000 mg | ORAL_TABLET | Freq: Every day | ORAL | 11 refills | Status: DC

## 2018-06-04 MED ORDER — LISINOPRIL 10 MG PO TABS: 10.0000 mg | ORAL_TABLET | Freq: Every day | ORAL | 2 refills | Status: DC
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
## 2018-06-04 NOTE — Patient Instructions (Addendum)
FOLLOW-UP INSTRUCTIONS Please schedule a follow-up appointment in July. What to bring: All of your medications  Thank you for your visit to the [facility] internal medicine clinic today.  I have ordered several labs to evaluate your shortness of breath, bleeding, swelling, and generalized health.  I will notify you of the of the results when they have returned.  For now we will need to refrain from prescribing medication for your knee pain until I can better assess your overall health and can determine which medication we most efficacious and safe.  If you have any concerns or questions please notify us anytime 24/7.

## 2018-06-05 ENCOUNTER — Encounter: Payer: Self-pay | Admitting: Internal Medicine

## 2018-06-05 DIAGNOSIS — R0609 Other forms of dyspnea: Secondary | ICD-10-CM

## 2018-06-05 DIAGNOSIS — R600 Localized edema: Secondary | ICD-10-CM | POA: Insufficient documentation

## 2018-06-05 DIAGNOSIS — K921 Melena: Secondary | ICD-10-CM | POA: Insufficient documentation

## 2018-06-05 DIAGNOSIS — R0600 Dyspnea, unspecified: Secondary | ICD-10-CM

## 2018-06-05 HISTORY — DX: Other forms of dyspnea: R06.09

## 2018-06-05 HISTORY — DX: Dyspnea, unspecified: R06.00

## 2018-06-05 HISTORY — DX: Localized edema: R60.0

## 2018-06-05 LAB — BMP8+ANION GAP
Anion Gap: 14 mmol/L (ref 10.0–18.0)
BUN / CREAT RATIO: 19 (ref 9–20)
BUN: 18 mg/dL (ref 6–24)
CALCIUM: 9.6 mg/dL (ref 8.7–10.2)
CHLORIDE: 105 mmol/L (ref 96–106)
CO2: 23 mmol/L (ref 20–29)
Creatinine, Ser: 0.93 mg/dL (ref 0.76–1.27)
GFR, EST AFRICAN AMERICAN: 105 mL/min/{1.73_m2} (ref >59)
GFR, EST NON AFRICAN AMERICAN: 91 mL/min/{1.73_m2} (ref >59)
GLUCOSE: 146 mg/dL — AB (ref 65–99)
POTASSIUM: 4.5 mmol/L (ref 3.5–5.2)
SODIUM: 142 mmol/L (ref 134–144)

## 2018-06-05 LAB — CBC
HEMOGLOBIN: 15.4 g/dL (ref 13.0–17.7)
Hematocrit: 44.6 % (ref 37.5–51.0)
MCH: 33.4 pg — ABNORMAL HIGH (ref 26.6–33.0)
MCHC: 34.5 g/dL (ref 31.5–35.7)
MCV: 97 fL (ref 79–97)
PLATELETS: 190 10*3/uL (ref 150–450)
RBC: 4.61 x10E6/uL (ref 4.14–5.80)
RDW: 12.6 % (ref 12.3–15.4)
WBC: 9.6 10*3/uL (ref 3.4–10.8)

## 2018-06-05 LAB — URINALYSIS, COMPLETE
BILIRUBIN UA: NEGATIVE
KETONES UA: NEGATIVE
NITRITE UA: NEGATIVE
Protein, UA: NEGATIVE
SPEC GRAV UA: 1.02 (ref 1.005–1.030)
UUROB: 0.2 mg/dL (ref 0.2–1.0)
pH, UA: 5.5 (ref 5.0–7.5)

## 2018-06-05 LAB — MICROSCOPIC EXAMINATION: CASTS: NONE SEEN /LPF

## 2018-06-05 LAB — HEPATIC FUNCTION PANEL
ALBUMIN: 4.1 g/dL (ref 3.5–5.5)
ALK PHOS: 81 IU/L (ref 39–117)
ALT: 147 IU/L — ABNORMAL HIGH (ref 0–44)
AST: 63 IU/L — ABNORMAL HIGH (ref 0–40)
BILIRUBIN, DIRECT: 0.1 mg/dL (ref 0.00–0.40)
Bilirubin Total: 0.3 mg/dL (ref 0.0–1.2)
TOTAL PROTEIN: 6.1 g/dL (ref 6.0–8.5)

## 2018-06-05 LAB — MICROALBUMIN / CREATININE URINE RATIO
Creatinine, Urine: 98.2 mg/dL
MICROALB/CREAT RATIO: 68.9 mg/g{creat} — AB (ref 0.0–30.0)
Microalbumin, Urine: 67.7 ug/mL

## 2018-06-05 LAB — TSH: TSH: 2.25 u[IU]/mL (ref 0.450–4.500)

## 2018-06-05 NOTE — Assessment & Plan Note (Signed)
Hematochezia: Patient endorses a history of very intermittent passing of clotted blood per rectum. This episodes are random, proceeded only by RUQ abd pain ~1hour prior. He stated that he has a history of bowel obstruction with possible restriction that was thought to be due to multiple intraabdominal surgeries. He did not undergo evaluation of the possible obstruction at that time. The patient denied feeling lightheaded, severe fatigue, dizziness, orthostatics, visual changes, abdominal pain, chest pain, hard stool or constipation. He also endorsed diarrhea that requires BID imodium to control.   Plan: CBC today unremarkable Patient will need GI referral when he has insurance available as he does not wish to pay out of pocket for this. I would recommend a colonoscopy as an outpatient if GI is agreeable.  Continue to monitor his CBC today Will need GI referral when able
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
## 2018-06-05 NOTE — Assessment & Plan Note (Signed)
Bilateral Lower extremity Edema: Unclear if the etiology at this time. Given his slightly elevated liver enzymes I am somewhat concerned that he may have a degree of cirrhosis of uncertain etiology. As the TSH was WNL's I do not feel this is myxedema. The BNP was reassuring that this is less likely CHF. Aside from the elevated glucose his BMP was unremarkable. Patient denied EtOH abuse history. Occasional drinker.   Plan: Will need to complete a more thorough physical exam upon his return visit with consideration of a POCUS abdominal US at that time as well.  Will need to consider CT abdomen if insurance is available

## 2018-06-05 NOTE — Assessment & Plan Note (Signed)
Diabetes: Last A1c in 12/2015 at 6.3 but not currently treated other than with diet and exercise. We will obtain an A1c today and complete his diabetic health maintenance evaluation.   Plan: --Will obtain referral to ophthalmology today --Completed diabetic foot exam --Ordered HgbA1c which returned at 7.1 a slight but notable increase --Continue pravastatin 20mg  daily --Continue to recommend diet and lifestyle modifications, will assess for metformin appropriateness at his next visit after addressing his other acute findings today

## 2018-06-05 NOTE — Assessment & Plan Note (Signed)
  Hematuria: Random occassional hematuria. NO clear proceeding activity or symptoms. Not associated with dysuria, frequency, or flank pain. Give his long standing history of smoking ~88pack years, I believe further evaluation is warranted but given that the symptoms began years prior and have not worsened I can not definitively determine a cause although he did . 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   Plan: UA today demonstrated +1 leuks, glucose and RBCs with reflex microscopic evaluation indicating 11-30 WBC's and >30 RBC's and few bacteria but without casts or epi cells.  Microalb/Creat ratio of 68.9 consistent with mid rang albuminuria. We will need to continue to monitor this with a repeat in 3-6 months for confirmation and consideration of nephrology at that time.

## 2018-06-05 NOTE — Assessment & Plan Note (Signed)
  HTN: BP today 134/79. Denied orthostatic symptoms, palpitations, chest pain, or syncope/presyncope.   Plan: Continue lisinopril 10mg  daily BMP today for renal function

## 2018-06-05 NOTE — Progress Notes (Signed)
Internal Medicine Clinic Attending  Case discussed with Dr. Berline Lopes  at the time of the visit.  We reviewed the resident's history and exam and pertinent patient test results.  I agree with the assessment, diagnosis, and plan of care documented in the resident's note.  Multiple issues that need further evaluation, with a major barrier being uninsured status. I would add that hematuria in a smoker will also need urology referral for cystoscopy to rule out bladder malignancy.

## 2018-06-09 ENCOUNTER — Telehealth: Payer: Self-pay | Admitting: Internal Medicine

## 2018-06-09 NOTE — Telephone Encounter (Signed)
Pls call patient regarding the fluid pills isnt working, [phone].

## 2018-06-09 NOTE — Telephone Encounter (Signed)
Pt states the furosemide is not going to work due to 2 reasons: 1) he states it caused him to become so generally weak that he couldn't do anything 2) states it also almost shut down his ability to void He was offered an appt, states he is having transportation problems so could dr Berline Lopes take care of this problem via ph

## 2018-06-11 NOTE — Telephone Encounter (Signed)
Dr Berline Lopes please advise of this note

## 2018-06-11 NOTE — Telephone Encounter (Signed)
Called pt, informed him of dr harbrecht's instructions, he repeated back and is agreeable, states he cannot get to clinic til Monday, appt made [facility] 6/17 at [facility]

## 2018-06-11 NOTE — Telephone Encounter (Signed)
He will need an appointment ASAP. Have him hold the medication for now and he will need to be seen this week or early next week for a BMP.  Thank you  Rivka Safer.

## 2018-06-16 ENCOUNTER — Other Ambulatory Visit: Payer: Self-pay

## 2018-06-16 ENCOUNTER — Ambulatory Visit (HOSPITAL_COMMUNITY)
Admission: RE | Admit: 2018-06-16 | Discharge: 2018-06-16 | Disposition: A | Discharge status: Home / Self Care | Payer: Self-pay | Source: Ambulatory Visit | Attending: Family Medicine | Admitting: Family Medicine

## 2018-06-16 ENCOUNTER — Ambulatory Visit (INDEPENDENT_AMBULATORY_CARE_PROVIDER_SITE_OTHER): Payer: Self-pay | Admitting: Internal Medicine

## 2018-06-16 VITALS — BP 121/73 | HR 97 | Temp 98.0°F | Ht 69.0 in | Wt 219.3 lb

## 2018-06-16 DIAGNOSIS — R079 Chest pain, unspecified: Secondary | ICD-10-CM | POA: Insufficient documentation

## 2018-06-16 DIAGNOSIS — R600 Localized edema: Secondary | ICD-10-CM

## 2018-06-16 DIAGNOSIS — E785 Hyperlipidemia, unspecified: Secondary | ICD-10-CM | POA: Insufficient documentation

## 2018-06-16 DIAGNOSIS — M2550 Pain in unspecified joint: Secondary | ICD-10-CM

## 2018-06-16 DIAGNOSIS — I10 Essential (primary) hypertension: Secondary | ICD-10-CM | POA: Insufficient documentation

## 2018-06-16 DIAGNOSIS — R0602 Shortness of breath: Secondary | ICD-10-CM

## 2018-06-16 DIAGNOSIS — Z79899 Other long term (current) drug therapy: Secondary | ICD-10-CM

## 2018-06-16 MED ORDER — SPIRONOLACTONE 25 MG PO TABS: 25.0000 mg | ORAL_TABLET | Freq: Every day | ORAL | 11 refills | Status: DC

## 2018-06-16 MED ORDER — NITROGLYCERIN 0.4 MG SL SUBL: 0.4000 mg | SUBLINGUAL_TABLET | SUBLINGUAL | 3 refills | Status: AC | PRN

## 2018-06-16 NOTE — Patient Instructions (Addendum)
[name],   I have prescribed you a different diuretic called spironolactone. Take 25 mg daily of Spironolactone.  Stop taking if this causes you to retain urine.   Please return to clinic on June 27 to discuss your echocardiogram results.

## 2018-06-16 NOTE — Assessment & Plan Note (Addendum)
Patient presenting with bilateral lower extremity edema.  He was seen in clinic by his PCP on June 05, 2018. Work up was overall unrevealing: BMP, BNP and TSH WNL. He did have elevated transaminases, albumin normal and no protein in his urine. He was prescribed Lasix 20 mg daily and an echocardiogram was ordered.   The patient returns today because he is unable to take Lasix. He states he cannot take Lasix because it causes him to retain urine rather than urinate more. He states he tried taking 1 pill, had urinary retention as well as lethargy, and did not like how the medication made him feel. He has been able to urinate since stopping the lasix. He states his swelling is the same and has not worsened since his prior visit. no personal history of DVTs. He does report SOB on exertion and intermittent CP that is associated with exertion. The CP is substernal, non-radiating and associated with nausea. He has an episode of CP this morning prior to arrival to clinic, which he attributes to stress and rushing. It lasted several minutes and eventually subsided.   On exam, the patient has 3 + pitting edema of the lower extremities bilaterally.  The legs are symmetric in size. No asymmetric erythema or warmth. No JVD, cardiac exam RRR,  lungs with left basilar rales otherwise CTAB. Abdominal exam, non-tender and distended.   Differential for LE swelling includes chronic HF, cirrhosis, and chronic venous insufficiency.  CP may be cardiac in origin and seems consistent with stable angina.   Plan: -Repeat EKG - EKG NSR with PVCs, T wave flattening in V1 and V2, otherwise similar to prior without ST segment elevations or evidence of acute ischemia -Start Spironolactone 25 mg daily  -Echo scheduled for 06/25/2018 - f/u results  -RTC after echo on 6/27 or 6/28  -Will likely require RUQ [place] to evaluate his liver  -F/u CP at next visit and if patient continues to have symptoms -SL nitro PRN for CP

## 2018-06-16 NOTE — Progress Notes (Signed)
   CC: lower extremity swelling  HPI:  Mr.Eleanor H Depuy is a 58 y.o. male with a past medical history listed below here today for follow up of his bilateral lower extremity swelling.   For details of today's visit and the status of his acute and chronic medical issues please refer to the assessment and plan.  Past Medical History:  Diagnosis Date  . Chest pain 05/14/2016   Chest pain   . Hyperlipidemia   . Hypertension   . Prostate disorder   . Renal calculus   . Right hip pain 12/12/2015   Review of Systems:   Review of Systems  Constitutional: Negative for chills and fever.  Respiratory: Positive for shortness of breath.   Cardiovascular: Positive for chest pain (intermittient) and leg swelling. Negative for orthopnea.  Gastrointestinal: Negative for nausea and vomiting.  Genitourinary: Negative for dysuria and flank pain.  Musculoskeletal: Positive for joint pain.    Physical Exam:  Vitals:   06/16/18 0926  BP: 121/73  Pulse: 97  Temp: 98 F (36.7 C)  TempSrc: Oral  SpO2: 97%  Weight: 219 lb 4.8 oz (99.5 kg)  Height: 5\' 9"  (1.753 m)   General: Sitting comfortably, NAD HEENT: [place]/AT, EOMI, no scleral icterus Cardiac: RRR, No R/M/G appreciated Pulm: normal effort, rales in left lung base otherwise CTAB Abd: distended, non-tender, BS normal Ext: extremities well perfused, bilateral 3+ pitting edema of LE to knees Neuro: alert and oriented X3, cranial nerves II-XII grossly intact   Assessment & Plan:   See Encounters Tab for problem based charting.  Patient discussed with Dr. Daryll Drown

## 2018-06-17 NOTE — Progress Notes (Signed)
Internal Medicine Clinic Attending  Case discussed with Dr. LaCroce at the time of the visit.  We reviewed the resident's history and exam and pertinent patient test results.  I agree with the assessment, diagnosis, and plan of care documented in the resident's note.  

## 2018-06-24 ENCOUNTER — Ambulatory Visit: Payer: Self-pay | Admitting: Dietician

## 2018-06-24 ENCOUNTER — Encounter: Payer: Self-pay | Admitting: Dietician

## 2018-06-24 ENCOUNTER — Other Ambulatory Visit: Payer: Self-pay | Admitting: Dietician

## 2018-06-24 ENCOUNTER — Ambulatory Visit: Payer: Self-pay

## 2018-06-24 DIAGNOSIS — E119 Type 2 diabetes mellitus without complications: Secondary | ICD-10-CM

## 2018-06-24 LAB — HM DIABETES EYE EXAM

## 2018-06-24 NOTE — Progress Notes (Signed)
Retinal images done and transmitted today 

## 2018-06-25 ENCOUNTER — Other Ambulatory Visit: Payer: Self-pay

## 2018-06-25 ENCOUNTER — Ambulatory Visit (HOSPITAL_COMMUNITY): Payer: Self-pay | Attending: Cardiology

## 2018-06-25 DIAGNOSIS — R600 Localized edema: Secondary | ICD-10-CM

## 2018-06-25 DIAGNOSIS — Z720 Tobacco use: Secondary | ICD-10-CM | POA: Insufficient documentation

## 2018-06-25 DIAGNOSIS — I10 Essential (primary) hypertension: Secondary | ICD-10-CM | POA: Insufficient documentation

## 2018-06-25 DIAGNOSIS — R0609 Other forms of dyspnea: Secondary | ICD-10-CM

## 2018-06-25 DIAGNOSIS — E785 Hyperlipidemia, unspecified: Secondary | ICD-10-CM | POA: Insufficient documentation

## 2018-06-25 DIAGNOSIS — E119 Type 2 diabetes mellitus without complications: Secondary | ICD-10-CM | POA: Insufficient documentation

## 2018-06-25 DIAGNOSIS — I081 Rheumatic disorders of both mitral and tricuspid valves: Secondary | ICD-10-CM | POA: Insufficient documentation

## 2018-06-30 ENCOUNTER — Encounter: Payer: Self-pay | Admitting: Dietician

## 2018-08-04 ENCOUNTER — Other Ambulatory Visit: Payer: Self-pay | Admitting: Internal Medicine

## 2018-08-07 ENCOUNTER — Other Ambulatory Visit: Payer: Self-pay | Admitting: Internal Medicine

## 2018-08-07 DIAGNOSIS — N400 Enlarged prostate without lower urinary tract symptoms: Secondary | ICD-10-CM

## 2018-10-29 ENCOUNTER — Other Ambulatory Visit: Payer: Self-pay | Admitting: Internal Medicine

## 2018-10-29 NOTE — Telephone Encounter (Signed)
Pt appt 11/05/2018 @ 2:45pm.

## 2018-11-05 ENCOUNTER — Ambulatory Visit (INDEPENDENT_AMBULATORY_CARE_PROVIDER_SITE_OTHER): Payer: Self-pay | Admitting: Internal Medicine

## 2018-11-05 ENCOUNTER — Encounter: Payer: Self-pay | Admitting: Internal Medicine

## 2018-11-05 VITALS — BP 163/77 | HR 99 | Temp 97.8°F | Wt 208.0 lb

## 2018-11-05 DIAGNOSIS — Z8719 Personal history of other diseases of the digestive system: Secondary | ICD-10-CM

## 2018-11-05 DIAGNOSIS — R600 Localized edema: Secondary | ICD-10-CM

## 2018-11-05 DIAGNOSIS — Z7984 Long term (current) use of oral hypoglycemic drugs: Secondary | ICD-10-CM

## 2018-11-05 DIAGNOSIS — E119 Type 2 diabetes mellitus without complications: Secondary | ICD-10-CM

## 2018-11-05 DIAGNOSIS — M170 Bilateral primary osteoarthritis of knee: Secondary | ICD-10-CM

## 2018-11-05 DIAGNOSIS — I10 Essential (primary) hypertension: Secondary | ICD-10-CM

## 2018-11-05 DIAGNOSIS — Z23 Encounter for immunization: Secondary | ICD-10-CM

## 2018-11-05 DIAGNOSIS — Z79899 Other long term (current) drug therapy: Secondary | ICD-10-CM

## 2018-11-05 DIAGNOSIS — Z1211 Encounter for screening for malignant neoplasm of colon: Secondary | ICD-10-CM

## 2018-11-05 LAB — POCT GLYCOSYLATED HEMOGLOBIN (HGB A1C): Hemoglobin A1C: 10.1 % — AB (ref 4.0–5.6)

## 2018-11-05 LAB — GLUCOSE, CAPILLARY: Glucose-Capillary: 346 mg/dL — ABNORMAL HIGH (ref 70–99)

## 2018-11-05 MED ORDER — LISINOPRIL 20 MG PO TABS: 20.0000 mg | ORAL_TABLET | Freq: Every day | ORAL | 1 refills | Status: DC

## 2018-11-05 MED ORDER — SITAGLIPTIN PHOS-METFORMIN HCL 50-500 MG PO TABS: 1.0000 | ORAL_TABLET | Freq: Two times a day (BID) | ORAL | 5 refills | Status: DC

## 2018-11-05 MED FILL — LISINOPRIL 20 MG TABLET: 20 | 30 days supply | Qty: 30 | Fill #0

## 2018-11-05 MED FILL — JANUMET 50-500 MG TABLET: 50-500 | 15 days supply | Qty: 30 | Fill #0

## 2018-11-05 NOTE — Progress Notes (Signed)
   CC: Routine for diabetes and HTN  HPI:Mr.AYYUB KRALL is a 58 y.o. male who presents for evaluation of diabetes and HTN. Please see individual problem based A/P for details.  Diabetes: Previously moderately controlled with diet, last A1c 7.1 with a steady average of near 6.2 prior. He stated that his activity level has dropped significantly and his diet has worsened with increased dependence of carbohydrate heavy foods.   Plan: A1c% today 10.1 up 3% in 5 months from 7.1.  Will start Janumet (renal function intact) 50-500mg  twice a day and uptitrate as tolerated Repeat A1c in 3 months.   HTN: 163/77 on this most recent visit. Up from his last visit. Denied orthostatic hypotensive episodes. Plan:  Increase Lisinopril to 20mg  daily Continue spironolactone 25mg  daily Continue doxazosin 8mg  BMP today with Potassium stable at 4.2. Will need to repeat BMP in 1-2 months  Bilateral osteoarthritis of the knees: Bilateral knees and left ankle, right ankle. Severe, restricts movement, limits ADL's, reduces his capacity to work to less than 10 hours weekly. NSAIDS contraindicated due to history of GI bleed. Tylenol of no benefit. Pain in unmanageable currently.  Will make a referral to a pain management clinic.  Patient does not currently have insurance available to obtain surgery which I feel is most appropriate. However, even with surgical replacement of his knees he will continue to experience the severe left ankle pain.   Colonoscopy: declined agreed to FIT  Influenza vaccine given today  PHQ-9: Based on the patients    Office Visit from 11/05/2018 in [place]  PHQ-9 Total Score  3     score we have decided to monitor.  Past Medical History:  Diagnosis Date  . Chest pain 05/14/2016   Chest pain   . Hyperlipidemia   . Hypertension   . Prostate disorder   . Renal calculus   . Right hip pain 12/12/2015   Review of Systems:  ROS negative except as per  HPI.  Physical Exam: Vitals:   11/05/18 1509  BP: (!) 163/77  Pulse: 99  Temp: 97.8 F (36.6 C)  TempSrc: Oral  SpO2: 99%  Weight: 208 lb (94.3 kg)   General: A/O x4, in no acute distress, afebrile, nondiaphoretic Cardio: RRR, no mrg's Pulmonary: CTA bilaterally MSK: Left lower extremity nontender, nonedematous  Assessment & Plan:   See Encounters Tab for problem based charting.  Patient discussed with Dr. Daryll Drown

## 2018-11-05 NOTE — Patient Instructions (Signed)
FOLLOW-UP INSTRUCTIONS When: 2-3 months or sooner if your pain worsens For: Lab and follow-up for your pain What to bring: All of your medications  I have increased your lisinopril today to 20mg  daily. I have added Janumet 50-500mg  twice daily for your diabetes, you will need to pick these prescriptions up at the [facility].  Today we discussed your knee pain, your high blood pressure, and your diabetes. We will increase your lisinopril for your blood pressure. We will add the Janumet for your diabetes. I have placed a referral for the pain management clinic for you knee pain. Ideally, you will likely need to consider surgery and should continue to pursue insurance for these.   I will notify you of the results of any labs from today's evaluation when available to me.   Thank you for your visit to the [facility] today. If you have any questions or concerns please call us at [phone].

## 2018-11-06 LAB — BMP8+ANION GAP
Anion Gap: 17 mmol/L (ref 10.0–18.0)
BUN/Creatinine Ratio: 23 — ABNORMAL HIGH (ref 9–20)
BUN: 19 mg/dL (ref 6–24)
CHLORIDE: 102 mmol/L (ref 96–106)
CO2: 23 mmol/L (ref 20–29)
Calcium: 9.6 mg/dL (ref 8.7–10.2)
Creatinine, Ser: 0.81 mg/dL (ref 0.76–1.27)
GFR calc non Af Amer: 99 mL/min/{1.73_m2} (ref >59)
GFR, EST AFRICAN AMERICAN: 114 mL/min/{1.73_m2} (ref >59)
GLUCOSE: 245 mg/dL — AB (ref 65–99)
Potassium: 4.2 mmol/L (ref 3.5–5.2)
SODIUM: 142 mmol/L (ref 134–144)

## 2018-11-06 NOTE — Assessment & Plan Note (Signed)
HTN: 163/77 on this most recent visit. Up from his last visit. Denied orthostatic hypotensive episodes. Plan:  Increase Lisinopril to 20mg  daily Continue spironolactone 25mg  daily Continue doxazosin 8mg  BMP today with Potassium stable at 4.2. Will need to repeat BMP in 1-2 months

## 2018-11-06 NOTE — Assessment & Plan Note (Signed)
Bilateral osteoarthritis of the knees: Bilateral knees and left ankle, right ankle. Severe, restricts movement, limits ADL's, reduces his capacity to work to less than 10 hours weekly. NSAIDS contraindicated due to history of GI bleed. Tylenol of no benefit. Pain in unmanageable currently.  Will make a referral to a pain management clinic.  Patient does not currently have insurance available to obtain surgery which I feel is most appropriate. However, even with surgical replacement of his knees he will continue to experience the severe left ankle pain.

## 2018-11-06 NOTE — Assessment & Plan Note (Signed)
Diabetes: Previously moderately controlled with diet, last A1c 7.1 with a steady average of near 6.2 prior. He stated that his activity level has dropped significantly and his diet has worsened with increased dependence of carbohydrate heavy foods.   Plan: A1c% today 10.1 up 3% in 5 months from 7.1.  Will start Janumet (renal function intact) 50-500mg  twice a day and uptitrate as tolerated Repeat A1c in 3 months.

## 2018-11-07 NOTE — Progress Notes (Signed)
Internal Medicine Clinic Attending  Case discussed with Dr. Harbrecht at the time of the visit.  We reviewed the resident's history and exam and pertinent patient test results.  I agree with the assessment, diagnosis, and plan of care documented in the resident's note.   

## 2018-11-07 NOTE — Addendum Note (Signed)
Addended by: Gilles Chiquito B on: 11/07/2018 01:56 PM   Modules accepted: Level of Service

## 2018-11-12 ENCOUNTER — Other Ambulatory Visit: Payer: Self-pay

## 2018-11-12 DIAGNOSIS — E119 Type 2 diabetes mellitus without complications: Secondary | ICD-10-CM

## 2018-11-12 MED ORDER — SITAGLIPTIN PHOS-METFORMIN HCL 50-500 MG PO TABS: 1.0000 | ORAL_TABLET | Freq: Two times a day (BID) | ORAL | 5 refills | Status: DC

## 2018-11-12 NOTE — Telephone Encounter (Signed)
Pt had Janumet RX filled on 11/05/18 and states he only received a 30 day supply.  RX is written for BID dosing.  [facility] called, spoke with Butch Penny and she confirms pt picked the RX up for #30 tabs/ 15 day supply. Will route to Dr. Berline Lopes to resend new RX/quantity amount. SChaplin, RN,BSN

## 2018-11-18 ENCOUNTER — Telehealth: Payer: Self-pay | Admitting: *Deleted

## 2018-11-18 MED FILL — JANUMET 50-500 MG TABLET: 50-500 | 30 days supply | Qty: 60 | Fill #0

## 2018-11-18 NOTE — Telephone Encounter (Signed)
Patient called in states he is supposed to take Janumet twice daily but only received 30 tabs from [place]. Spoke with Benjamine Mola at pharmacy, states they filled an previous rx that was 30 tabs for 15 day supply. Will fill rx sent on 11/12/2018 today for 60 tabs. Patient made aware and is very appreciative. Hubbard Hartshorn, RN, BSN

## 2018-11-21 ENCOUNTER — Ambulatory Visit: Payer: Self-pay

## 2018-11-24 ENCOUNTER — Telehealth: Payer: Self-pay | Admitting: *Deleted

## 2018-11-24 NOTE — Telephone Encounter (Signed)
Called patient, reports severe right shoulder pain and back pain, thinks it started about 2 weeks after starting Janumet.  No fever, mild sore throat and dry cough.    Discussed would be fine to hold Janumet however this is not a common side effect of the medication.  Would like to have him in for evaluation.  It appears that we have an open slot at 3:45pm tomorrow I asked that he come in for an appointmetn at that time.  Would consider checking CK and evaluate shoulder.

## 2018-11-24 NOTE — Telephone Encounter (Signed)
Call from pt- stated he has been having neck and shoulder cramps which started 2-2.5 weeks after he started taking Janumet. He stated he has not strained or pulled anything. Wanted to kow if this is a side effect from [facility] ; I told I will ask his doctor.

## 2018-11-25 ENCOUNTER — Other Ambulatory Visit: Payer: Self-pay

## 2018-11-25 ENCOUNTER — Ambulatory Visit (INDEPENDENT_AMBULATORY_CARE_PROVIDER_SITE_OTHER): Payer: Self-pay | Admitting: Internal Medicine

## 2018-11-25 VITALS — BP 145/82 | HR 89 | Temp 97.9°F | Ht 69.0 in | Wt 202.4 lb

## 2018-11-25 DIAGNOSIS — M25511 Pain in right shoulder: Secondary | ICD-10-CM

## 2018-11-25 DIAGNOSIS — I10 Essential (primary) hypertension: Secondary | ICD-10-CM

## 2018-11-25 DIAGNOSIS — M1990 Unspecified osteoarthritis, unspecified site: Secondary | ICD-10-CM

## 2018-11-25 DIAGNOSIS — Z79899 Other long term (current) drug therapy: Secondary | ICD-10-CM

## 2018-11-25 MED ORDER — TIZANIDINE HCL 4 MG PO TABS: 4.0000 mg | ORAL_TABLET | Freq: Three times a day (TID) | ORAL | 0 refills | Status: AC

## 2018-11-25 NOTE — Patient Instructions (Signed)
It was a pleasure meeting you today, Mr. Messman!  The pain you are experiencing is most likely caused by a muscle spasm of the trapezius muscle. 1. I have written a prescription for the muscle relaxant tizanidine. You can take 4mg  three times a day as needed for spasm. 2. You also need an anti-inflammatory medication, like ibuprofen. You can take the ibuprofen you already have at home. You can take 400mg  every 6 hours. Do this for at least three days.  3. Please come back in 1 week to follow-up. If your pain isn't better, we may need to get imaging of the spine.  4. You can restart your Janumet.   Feel free to call us at [phone] if you have any questions.  Thanks, Dr. Annie Paras

## 2018-11-25 NOTE — Progress Notes (Signed)
   CC: right shoulder pain  HPI:   [name] is a 58 y.o. male with a medical history of HTN and OA as well as the other medical conditions listed below who presents to the internal medicine clinic with right shoulder pain that started four days ago. He reports that the pain started acutely when he was sitting in his trunk not using his arm. He describes the pain as a sharp pain that radiates from the top of the right shoulder to the neck. The pain is constantly present, but he intermittently has severe muscle spasms on top of the pain. The spasms are so severe that he has fallen down from them. The pain improves with rest and worsens with movement. The pain is most severe in the evening. He took flexeril which did not help. 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 He has a history of muscle spasms, but this is more severe than anything he has experienced before. He is s/p right rotator cuff repair. Patient is concerned that this is a side effect of Janumet, which he started 3 weeks ago. He stopped taking the Janumet because of the pain. Please see problem based charting for the status of the patient's current and chronic medical conditions.   Past Medical History:  Diagnosis Date  . Chest pain 05/14/2016   Chest pain   . Hyperlipidemia   . Hypertension   . Prostate disorder   . Renal calculus   . Right hip pain 12/12/2015    Review of Systems:   Pertinent positives mentioned in HPI. Remainder of all ROS negative.  Physical Exam: Vitals:   11/25/18 1602  BP: (!) 145/82  Pulse: 89  Temp: 97.9 F (36.6 C)  TempSrc: Oral  SpO2: 98%  Weight: 202 lb 6.4 oz (91.8 kg)  Height: 5\' 9"  (1.753 m)   Physical Exam  Constitutional: Well-developed, well-nourished, and in no distress.  Eyes: Pupils are equal, round, and reactive to light. EOM are normal.  Neuro: Normal sensation throughout. 5/5 strength bilaterally.  Strength appears limited on the right 2/2 pain. Ext: Hyperactive, tense trapezius muscle with TTP. No midline cervical tenderness. ROM limited 2/2 pain. No true weakness.  Skin: Warm and dry. No rashes or wounds.   Assessment & Plan:   See Encounters Tab for problem based charting.  Patient seen with Dr. Rebeca Alert

## 2018-11-26 ENCOUNTER — Encounter: Payer: Self-pay | Admitting: Internal Medicine

## 2018-11-26 DIAGNOSIS — M25511 Pain in right shoulder: Secondary | ICD-10-CM | POA: Insufficient documentation

## 2018-11-26 HISTORY — DX: Pain in right shoulder: M25.511

## 2018-11-26 NOTE — Assessment & Plan Note (Addendum)
Pain is most likely caused by right-sided trapezius muscle spasm. Since flexeril did not work, will treat with tizanidine and NSAID for anti-inflammation. Pt states he has ibuprofen at home that he will take. He only wants to take a short course of NSAIDs because he has a history of gastritis with NSAID use. Advised the patient to follow-up in 1 week for reassessment. If treatment of muscle spasm does not improve the pain, he may require cervical spine imaging to rule out cervical radiculopathy. His pain is not likely related to the [place]. I advised the patient to resume this medication.  Plan 1. Tizanidine 4mg  TID 2. Ibuprofen 400mg  q6hrs x 3 days  3. Resume Janumet 4. F/u in 1 week

## 2018-11-26 NOTE — Progress Notes (Signed)
Internal Medicine Clinic Attending  I saw and evaluated the patient.  I personally confirmed the key portions of the history and exam documented by Dr. Annie Paras and I reviewed pertinent patient test results.  The assessment, diagnosis, and plan were formulated together and I agree with the documentation in the resident's note.  Here with acute right shoulder pain. H/o right rotator cuff repair, but this pain is different. Comes in spasms, very limited function right now. On exam, tender at [facility] joint and along trapezius. Trapezius is very firm and tight. Active and passive ROM with flexion and abduction limited by pain, extension ROM reasonable. Somewhat shaky during active ROM testing. Strength for internal and external rotation intact. Empty can test limited by pain. Spurling negative. Distal strength, sensation and perfusion intact. Overall clinical picture most suggestive of trapezius muscle spasm. No evidence of rotator cuff tear. Strength appears intact, but exam limited by severe pain. Did not respond to flexeril, will trial tizanidine, if not improving, may require further evaluation with [place] and possible cervical spine imaging.   Lenice Pressman, M.D., Ph.D.

## 2018-12-02 ENCOUNTER — Telehealth: Payer: Self-pay | Admitting: Internal Medicine

## 2018-12-02 NOTE — Telephone Encounter (Signed)
Triage is sending this to both dr Berline Lopes and donnaP., donna may be able to resolve this easily and reassure pt.

## 2018-12-02 NOTE — Telephone Encounter (Addendum)
Tried calling patient at home. No answer, unable to leave a message. Called mobile number and spoke with him.  He has not checked his blood sugar and so concerned about his A1C increase to 10.1% that he stopped all potatoes, orange juice, bread and is also concerned that he won't be getting enough vitamin C or potassium after cutting out those foods. He has a meter that was given to him and wants to begin checking and would like to know how often and what numbers he should be looking for. Advised checking blood sugar one time a day for now before breakfast,  [employer] CBGs 80-180 and if >250 for more than 2-3 days in a row to call the office. Patient also counseled on eating oranges instead of juice, small portions of mashed or baked potato and 1 glass of milk /day. He verbalized understanding.  Will ask front office to call him with a follow up appointment.

## 2018-12-02 NOTE — Telephone Encounter (Signed)
Thank you. I agree 

## 2018-12-02 NOTE — Telephone Encounter (Signed)
Patient would like a call back to discuss his Blood Sugars and how many times he needs to test a day.

## 2018-12-03 NOTE — Telephone Encounter (Signed)
Spoke with the patient.  Patient sch an appt for 12/09/2018 with [facility].

## 2018-12-09 ENCOUNTER — Ambulatory Visit: Payer: Self-pay

## 2018-12-10 ENCOUNTER — Ambulatory Visit (INDEPENDENT_AMBULATORY_CARE_PROVIDER_SITE_OTHER): Payer: Self-pay | Admitting: Internal Medicine

## 2018-12-10 ENCOUNTER — Ambulatory Visit (HOSPITAL_COMMUNITY)
Admission: RE | Admit: 2018-12-10 | Discharge: 2018-12-10 | Disposition: A | Discharge status: Home / Self Care | Payer: Self-pay | Source: Ambulatory Visit | Attending: Internal Medicine | Admitting: Internal Medicine

## 2018-12-10 ENCOUNTER — Encounter: Payer: Self-pay | Admitting: Internal Medicine

## 2018-12-10 ENCOUNTER — Other Ambulatory Visit: Payer: Self-pay

## 2018-12-10 VITALS — BP 141/75 | HR 112 | Temp 98.7°F | Wt 197.7 lb

## 2018-12-10 DIAGNOSIS — W11XXXA Fall on and from ladder, initial encounter: Secondary | ICD-10-CM

## 2018-12-10 DIAGNOSIS — G8929 Other chronic pain: Secondary | ICD-10-CM

## 2018-12-10 DIAGNOSIS — R936 Abnormal findings on diagnostic imaging of limbs: Secondary | ICD-10-CM | POA: Insufficient documentation

## 2018-12-10 DIAGNOSIS — Z9181 History of falling: Secondary | ICD-10-CM

## 2018-12-10 DIAGNOSIS — E119 Type 2 diabetes mellitus without complications: Secondary | ICD-10-CM

## 2018-12-10 DIAGNOSIS — R0609 Other forms of dyspnea: Secondary | ICD-10-CM

## 2018-12-10 DIAGNOSIS — R0600 Dyspnea, unspecified: Secondary | ICD-10-CM

## 2018-12-10 DIAGNOSIS — Z89022 Acquired absence of left finger(s): Secondary | ICD-10-CM

## 2018-12-10 DIAGNOSIS — S60413A Abrasion of left middle finger, initial encounter: Secondary | ICD-10-CM

## 2018-12-10 DIAGNOSIS — Z7984 Long term (current) use of oral hypoglycemic drugs: Secondary | ICD-10-CM

## 2018-12-10 DIAGNOSIS — M25562 Pain in left knee: Secondary | ICD-10-CM

## 2018-12-10 DIAGNOSIS — M25511 Pain in right shoulder: Secondary | ICD-10-CM

## 2018-12-10 DIAGNOSIS — M25522 Pain in left elbow: Secondary | ICD-10-CM | POA: Insufficient documentation

## 2018-12-10 HISTORY — DX: Fall on and from ladder, initial encounter: W11.XXXA

## 2018-12-10 MED ORDER — OXYCODONE HCL 5 MG PO TABS: 5.0000 mg | ORAL_TABLET | Freq: Four times a day (QID) | ORAL | 0 refills | Status: AC | PRN

## 2018-12-10 NOTE — Assessment & Plan Note (Signed)
Discussed briefly with patient. He states Tizanidine gave him a bad headache and he did not take much of it. The pain has however improved and he has full range of motion. - Continue to monitor

## 2018-12-10 NOTE — Assessment & Plan Note (Signed)
Patient reports falling from a ladder on Saturday. Of note, he states he had had a small fall when his knee gave out getting out of the back of his truck earlier in the day but denies any injury from this. Later that day he fell about 6 feet on to a hard surface when the leg of his ladder gave out.  He state he landed on his left elbow and knee and hit his head. He states he barely remembers the ride home afterwards and did have a headache, but his headaches has resolved and he has had no memory of sleep issues since. He states his knee pain has been constant at about 8/10 but this is his chronic pain level. His elbow pain is rated at a 10/10 and he states it seems to be getting worse. He additionally scrapped his L 3rd digit and has kept a bandage on it.   On exam range of motion of his elbow is intact but associated with increased pain. Strength is 5/5 LUE with 4/5 for L 1st and 2nd digit, which he states is not new. Elbow is tender to palpation with some swelling over his olecranon process. ROM of knee is intact with chronic level of pain, no new edema, erythema, or laxity noted. 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 Some skin changes appear related to moisture as patient has kept a bandage on it since Saturday,  Exam concerning for possible fracture of elbow, will send for X rays. No significant acute changes of L knee, will not pursue further imaging at this time. Patient instructed to keep his finger clean and dry. Will provide short course of pain medication - Xray L elbow - Oxycodone 5mg  q6h, PRN Severe Pain, x3d #10

## 2018-12-10 NOTE — Assessment & Plan Note (Signed)
Patient has a history of Diabetes and was started on Janumet about 1 month ago. He brought his meter today; there were only a few values recorded, but all were at goal. - Continue Janumet 50-500mg  BID - Follow up with PCP in 2 months

## 2018-12-10 NOTE — Patient Instructions (Addendum)
Thank you for allowing [place] to care for you  For your recent fall - We will get X-rays of your elbow to evaluate for fracture - Continue to keep finger wound clean and dry - Provided you with 3 days of pain medication  For you diabetes - Follow up with PCP in 2 months  Follow up in 2 months

## 2018-12-10 NOTE — Assessment & Plan Note (Addendum)
On ROS he endorsed Chest Pain with associated SOB on exesion which is sometime relieved by rest. He denies any other associated symptoms and states he was last seen for chest pain 2 years ago and had low risk Myoview at that time. He did have Echo in June with normal EF and no wall motion abnormalities and and EKG in June showing sinus rhythm with PACs and PVCs. He has been seen by cardiology in the past and is encouraged to follow up for possible stress test. - Follow up with Cardiology

## 2018-12-10 NOTE — Progress Notes (Signed)
   CC: Fall from ladder, Diabetes, Right Shoulder pain, Dyspnea on exertion  HPI:  [name] Acosta is a 58 y.o. M with PMHx listed below presenting for  Fall from ladder, Diabetes. Please see the A&P for the status of the patient's chronic medical problems.   Past Medical History:  Diagnosis Date  . Chest pain 05/14/2016   Chest pain   . Hyperlipidemia   . Hypertension   . Prostate disorder   . Renal calculus   . Right hip pain 12/12/2015   Review of Systems: Performed and all others negative.  Physical Exam:   Vitals:   12/10/18 0900  BP: (!) 141/75  Pulse: (!) 112  Temp: 98.7 F (37.1 C)  TempSrc: Oral  SpO2: 98%  Weight: 197 lb 11.2 oz (89.7 kg)   Physical Exam  Constitutional: He appears well-developed and well-nourished. No distress.  Cardiovascular: Normal rate, regular rhythm, normal heart sounds and intact distal pulses.  Pulmonary/Chest: Effort normal and breath sounds normal. No respiratory distress.  Abdominal: Soft. Bowel sounds are normal. He exhibits no distension. There is no tenderness.  Musculoskeletal:  ROM of R shoulder: full ROM of L elbow: full, painful Strength is 5/5 LUE with 4/5 for L 1st and 2nd digit.  Elbow TTP with mild edema over olecranon process. ROM of knee: Intact, with some pain (chronic) LLE: no edema, erythema, or laxity noted.  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 Some skin changes appear related to moisture as patient has kept a bandage on it since Saturday  Skin: Skin is warm and dry.    Assessment & Plan:   See Encounters Tab for problem based charting.  Patient discussed with Dr. Lynnae January

## 2018-12-10 NOTE — Progress Notes (Signed)
Internal Medicine Clinic Attending  Case discussed with Dr. Melvin  at the time of the visit.  We reviewed the resident's history and exam and pertinent patient test results.  I agree with the assessment, diagnosis, and plan of care documented in the resident's note.  

## 2018-12-11 ENCOUNTER — Telehealth: Payer: Self-pay | Admitting: Internal Medicine

## 2018-12-11 NOTE — Telephone Encounter (Signed)
Called patient and inform him of the results of his X-ray. X-ray showed possible minimally impacted fracture of his radial head, which is most likely the case  given his presentation. - Patient instructed to wear a sling if need for comfort - He is to perform ROM exercises with flexion and extension at elbow and supination pronation of his left hand by rotating his palm up and down.

## 2018-12-29 ENCOUNTER — Telehealth: Payer: Self-pay

## 2018-12-29 MED FILL — JANUMET 50-500 MG TABLET: 50-500 | 30 days supply | Qty: 60 | Fill #1

## 2018-12-29 NOTE — Telephone Encounter (Signed)
Pt stated his phone was charging when Butch Penny called him. Informed of Donna's reply "let him know that he should repeat blood glucose checks that are unusual after washing hands and drying thoroughly with regular soap and water and clean towel. If blood sugars are still fluctuating more than 50 mg/dl, this is likely due to what he is eating or drinking and physical activity or lack of physical activity." Stated all right; he will call back if fluctuation continues.

## 2018-12-29 NOTE — Telephone Encounter (Signed)
I was unable to get through to [name] Acosta today by phone. Please let him know that he should repeat blood glucose checks that are unusual after washing hands and drying thoroughly with regular soap and water and clean towel. If blood sugars are still fluctuating more than 50 mg/dl, this is likely due to what he is eating or drinking and physical activity or lack of physical activity.

## 2018-12-29 NOTE — Telephone Encounter (Signed)
Please call pt back about blood sugar.

## 2018-12-29 NOTE — Telephone Encounter (Signed)
Pt stated he's concern with fluctuation of his blood sugars. BS - 140, 160, 198, 272; this morning 113. Stated he's taking Janumet 1 tab BID as instructed. I will send to Christene Slates also.

## 2019-01-16 ENCOUNTER — Telehealth: Payer: Self-pay | Admitting: Internal Medicine

## 2019-01-16 NOTE — Telephone Encounter (Signed)
Patient called in regarding Financial Assistance, patient also came in December received form which includes all requirements for assistance, patient is refusing to bring in all require documents and being to be very rude.

## 2019-01-22 ENCOUNTER — Encounter: Payer: Self-pay | Admitting: Internal Medicine

## 2019-01-23 ENCOUNTER — Other Ambulatory Visit: Payer: Self-pay | Admitting: Internal Medicine

## 2019-01-23 NOTE — Telephone Encounter (Signed)
[name] Acosta with [facility], needs to speak with a nurse about lisinopril dosage.

## 2019-01-23 NOTE — Telephone Encounter (Signed)
Call made to pharmacy -needed to confirm lisinopril dose, dose confirmed at lisinopril 20mg  tab one daily.Despina Hidden Cassady1/24/20204:20 PM

## 2019-01-28 MED FILL — JANUMET 50-500 MG TABLET: 50-500 | 30 days supply | Qty: 60 | Fill #2

## 2019-02-12 ENCOUNTER — Ambulatory Visit (INDEPENDENT_AMBULATORY_CARE_PROVIDER_SITE_OTHER): Payer: Self-pay | Admitting: Internal Medicine

## 2019-02-12 ENCOUNTER — Encounter: Payer: Self-pay | Admitting: Internal Medicine

## 2019-02-12 ENCOUNTER — Other Ambulatory Visit: Payer: Self-pay

## 2019-02-12 VITALS — BP 128/76 | HR 96 | Temp 98.1°F | Ht 69.0 in | Wt 199.7 lb

## 2019-02-12 DIAGNOSIS — G8929 Other chronic pain: Secondary | ICD-10-CM

## 2019-02-12 DIAGNOSIS — M545 Low back pain, unspecified: Secondary | ICD-10-CM | POA: Insufficient documentation

## 2019-02-12 DIAGNOSIS — M170 Bilateral primary osteoarthritis of knee: Secondary | ICD-10-CM

## 2019-02-12 DIAGNOSIS — Z86011 Personal history of benign neoplasm of the brain: Secondary | ICD-10-CM

## 2019-02-12 DIAGNOSIS — G44019 Episodic cluster headache, not intractable: Secondary | ICD-10-CM

## 2019-02-12 DIAGNOSIS — H5789 Other specified disorders of eye and adnexa: Secondary | ICD-10-CM

## 2019-02-12 DIAGNOSIS — G44009 Cluster headache syndrome, unspecified, not intractable: Secondary | ICD-10-CM

## 2019-02-12 HISTORY — DX: Other specified disorders of eye and adnexa: H57.89

## 2019-02-12 MED ORDER — SUMATRIPTAN SUCCINATE 6 MG/0.5ML ~~LOC~~ SOLN: 6.0000 mg | SUBCUTANEOUS | 0 refills | Status: DC | PRN

## 2019-02-12 NOTE — Assessment & Plan Note (Signed)
[name] presents with intermittent R eye redness since a fall he had in December of 2019. On further questioning, he reports this is preceded by a "bad headache" that he describes as sharp and is associated with eye lacrimation and photophobia. Denies foreign body sensation. He has had 3-4 episodes since December and they last from 10 minutes up to 2 hours at times. He denies nasal congestion, though he does suffer from chronic sinusitis and is on decongestants at home. He denies vision changes. He uses glasses and reports being able to see "just fine" with his current glasses. Denies fever, chills, night sweats, eye discharge, and rashes. Endorses joint pain that is chronic in nature. He does not have a history of migraines or Has in the past. Of note, he has a history of pituitary adenoma s/p transphenoidal resection on 1988. At that time he was experiencing headaches and peripheral vision loss, which he denies today.   On exam, he has mild scleral injection in both L and R eyes as well as dryness. His pupils are symmetric and equally reactive to light. No visual field deficits noted and visual acuity appears at baseline when he uses his glasses.   Patient's description of headache suggestive of a cluster headache given autonomic signs. However, given new HA in a patient of 59 years of age will need an MRI brain for further evaluation. Will treat HA with SQ sumatriptan 6mg .  Of note, patient is uninsured, currently in the process of applying for financial help. May not be able to afford MRI or medication. He was advised to call if unable to afford medication.

## 2019-02-12 NOTE — Patient Instructions (Addendum)
[name] Acosta,   The redness in your right eye is due to a headache that is called a cluster headache.  For these types of headaches we recommend a specific medication called sumatriptan.  This is a subcutaneous injection that you will give yourself with your headache starts.  This should resolve your headache.  If you are not able to afford the medication please give [place] a call and let us know so we can prescribe you something else.  Because you have a history of pituitary adenoma we will get an MRI of your brain to make sure there is no new tumors.   For your knee arthritis we will get an MRI of your left knee and for your lower back pain we will get an MRI of your lower back.  I also referred you to an orthopedic surgeon and a neurosurgeon for further evaluation.  Call us if you have any questions or concerns.  -Dr. Frederico Hamman

## 2019-02-13 ENCOUNTER — Encounter: Payer: Self-pay | Admitting: Internal Medicine

## 2019-02-13 NOTE — Assessment & Plan Note (Signed)
Patient has a history of bilateral knee pain secondary to OA He reports difficulty standing up, but able to ambulate without difficulty with assistance of a cane. It does not appear he has had imaging of his knees in the past. He recently established with a pain clinic in the area and his provider is requesting imaging of the L knee as well as orthopedic surgery referral for evaluation of knee replacement. I ordered L knee MRI, but unsure if he will be able to afford this as he is uninsured. He is currently applying for financial assistance. Will hold off on orthopedic surgery referral as patient unable to pay visit out of pocket.

## 2019-02-13 NOTE — Progress Notes (Signed)
   CC: R eye redness and joint pain   HPI:  [name] Acosta is a 59 y.o. year-old male with PMH listed below who presents to clinic for R eye redness and joint pain. Please see problem based assessment and plan for further details.   Past Medical History:  Diagnosis Date  . Chest pain 05/14/2016   Chest pain   . Hyperlipidemia   . Hypertension   . Prostate disorder   . Renal calculus   . Right hip pain 12/12/2015   Review of Systems:  Review of Systems  Constitutional: Negative for chills, fever, malaise/fatigue and weight loss.  HENT: Negative for congestion and sinus pain.   Eyes: Positive for pain and redness. Negative for blurred vision, double vision and discharge.  Musculoskeletal: Positive for back pain and joint pain.  Neurological: Positive for headaches. Negative for dizziness, sensory change and focal weakness.    Physical Exam: Vitals:   02/12/19 1420  BP: 128/76  Pulse: 96  Temp: 98.1 F (36.7 C)  TempSrc: Oral  SpO2: 98%  Weight: 199 lb 11.2 oz (90.6 kg)  Height: 5\' 9"  (1.753 m)   General: elderly male, well-appearing, in no acute distress Eyes: anicteric sclera, PERRL, no visual field deficits, EOMI, no changes in visual acuity  Cardiac: regular rate and rhythm, nl S1/S2, no murmurs, rubs or gallops  Pulm: CTAB, no wheezes or crackles, no increased work of breathing on room air  Neuro: A&Ox3, sensation and strength are intact in all 4 extremities, lower back and LE pain with active motion  Ext: warm and well perfused, no peripheral edema   Assessment & Plan:   See Encounters Tab for problem based charting.  Patient discussed with Dr. Lynnae January

## 2019-02-13 NOTE — Assessment & Plan Note (Addendum)
Patient has a history of lower back pain and bilateral LE pain. He has not had imaging of his lower back in the past. He recently established with a pain clinic for further evaluation and management and they recommended imaging of lumbar spine with MRI and referral to neurosurgery. Imaging ordered placed. Will hold off for neurosurgery referral as patient unable to pay for visit out of pocket and he is uninsured.

## 2019-02-13 NOTE — Progress Notes (Signed)
Internal Medicine Clinic Attending  Case discussed with Dr. Santos-Sanchez at the time of the visit.  We reviewed the resident's history and exam and pertinent patient test results.  I agree with the assessment, diagnosis, and plan of care documented in the resident's note.    

## 2019-02-24 MED FILL — JANUMET 50-500 MG TABLET: 50-500 | 30 days supply | Qty: 60 | Fill #3 | Status: TO

## 2019-03-11 ENCOUNTER — Encounter: Payer: Self-pay | Admitting: Internal Medicine

## 2019-03-11 ENCOUNTER — Other Ambulatory Visit: Payer: Self-pay

## 2019-03-11 ENCOUNTER — Ambulatory Visit (INDEPENDENT_AMBULATORY_CARE_PROVIDER_SITE_OTHER): Payer: Self-pay | Admitting: Internal Medicine

## 2019-03-11 VITALS — BP 124/63 | HR 94 | Temp 98.1°F | Ht 69.0 in | Wt 196.7 lb

## 2019-03-11 DIAGNOSIS — Z7984 Long term (current) use of oral hypoglycemic drugs: Secondary | ICD-10-CM

## 2019-03-11 DIAGNOSIS — Z79899 Other long term (current) drug therapy: Secondary | ICD-10-CM

## 2019-03-11 DIAGNOSIS — G44009 Cluster headache syndrome, unspecified, not intractable: Secondary | ICD-10-CM

## 2019-03-11 DIAGNOSIS — M549 Dorsalgia, unspecified: Secondary | ICD-10-CM

## 2019-03-11 DIAGNOSIS — I10 Essential (primary) hypertension: Secondary | ICD-10-CM

## 2019-03-11 DIAGNOSIS — M170 Bilateral primary osteoarthritis of knee: Secondary | ICD-10-CM

## 2019-03-11 DIAGNOSIS — E119 Type 2 diabetes mellitus without complications: Secondary | ICD-10-CM

## 2019-03-11 DIAGNOSIS — G8929 Other chronic pain: Secondary | ICD-10-CM

## 2019-03-11 DIAGNOSIS — H5789 Other specified disorders of eye and adnexa: Secondary | ICD-10-CM

## 2019-03-11 LAB — POCT GLYCOSYLATED HEMOGLOBIN (HGB A1C): Hemoglobin A1C: 5.5 % (ref 4.0–5.6)

## 2019-03-11 LAB — GLUCOSE, CAPILLARY: Glucose-Capillary: 112 mg/dL — ABNORMAL HIGH (ref 70–99)

## 2019-03-11 MED ORDER — SITAGLIPTIN PHOS-METFORMIN HCL 50-500 MG PO TABS: 1.0000 | ORAL_TABLET | Freq: Every day | ORAL | 5 refills | Status: DC

## 2019-03-11 NOTE — Assessment & Plan Note (Signed)
Type 2 diabetes mellitus: His diabetes was previously moderately controlled however he was noted to have an elevated hemoglobin A1c of 10.1% for which Janumet 50-500 mg twice daily was initiated.  Today, he comes in with his glucometer.  Average blood glucose is measured at 116, lowest blood glucose of 95, and highest and 140.  He does not report of any hypoglycemic episodes.  His A1c today was was 5.5%.  Plan: -Asked patient to decrease Janumet 50-500 mg from BID to ONCE DAILY -He is to follow-up at the clinic in 3 months for reassessment and further medication management

## 2019-03-11 NOTE — Patient Instructions (Addendum)
[name],  It was a pleasure taking care of you at the clinic today.  I am glad to hear that your headaches have not been flaring up like it used to.  Here my recommendations after today's visit  1.  For your diabetes, your A1c was 5.5% today which is exceptionally good.  So what I would like for you to do is to take Janumet only once a day.  I have advised him to continue checking his blood sugar once daily, if he begins having hypoglycemic episodes he should give [place] a call and further medication adjustment will be discussed. 2.  For your headache, I would like for you to continue using the sumatriptan and also obtain the MRI as has been previously discussed 3.  Your blood pressure is well under control so I am not making any changes to your medication 4.  For your chronic back pain, please continue to follow-up with the pain clinic 5.  I would like for you to see you back here in the clinic in 3 months  Take Care   Dr. Eileen Stanford  Please call the internal medicine center clinic if you have any questions or concerns, we may be able to help and keep you from a long and expensive emergency room wait. Our clinic and after hours phone number is [phone], the best time to call is Monday through Friday 9 am to 4 pm but there is always someone available 24/7 if you have an emergency. If you need medication refills please notify your pharmacy one week in advance and they will send [place] a request.

## 2019-03-11 NOTE — Progress Notes (Signed)
   CC: Follow-up diabetes mellitus  HPI:  [name] is a 59 y.o. gentleman with medical history listed below presenting for follow-up diabetes and hypertension.  Please see problem based charting for further details.  Past Medical History:  Diagnosis Date  . Chest pain 05/14/2016   Chest pain   . Hyperlipidemia   . Hypertension   . Prostate disorder   . Renal calculus   . Right hip pain 12/12/2015   Review of Systems: As per HPI  Physical Exam:  Vitals:   03/11/19 1514  BP: 124/63  Pulse: 94  Temp: 98.1 F (36.7 C)  TempSrc: Oral  SpO2: 97%  Weight: 196 lb 11.2 oz (89.2 kg)  Height: 5\' 9"  (1.753 m)   Physical Exam Constitutional:      General: He is not in acute distress. HENT:     Head: Normocephalic and atraumatic.  Cardiovascular:     Rate and Rhythm: Normal rate and regular rhythm.     Pulses: Normal pulses.     Heart sounds: Normal heart sounds. No murmur.  Pulmonary:     Effort: Pulmonary effort is normal.     Breath sounds: Normal breath sounds. No wheezing, rhonchi or rales.  Abdominal:     General: Bowel sounds are normal.  Neurological:     General: No focal deficit present.     Mental Status: He is alert. Mental status is at baseline.     Cranial Nerves: No cranial nerve deficit.     Sensory: No sensory deficit.     Motor: No weakness.     Gait: Abnormal gait: Gait limited by OA of knees.  Psychiatric:        Mood and Affect: Mood normal.        Behavior: Behavior normal.     Assessment & Plan:   See Encounters Tab for problem based charting.  Patient discussed with Dr. Lynnae January

## 2019-03-11 NOTE — Assessment & Plan Note (Addendum)
Cluster headache: [name] Acosta was evaluated by Dr. Frederico Hamman on February 12, 2019 with complaints of conjunctivitis of the right eye and headache associated with lacrimation and photophobia.  There was concern for cluster headaches and he was started on subcutaneous sumatriptan.  Since initiating therapy, he reports significant improvement with headaches.  He typically uses subcu sumatriptan once every week and half when he experiences headache.  He denies vomiting, weakness but does endorse occasional nausea.  An order for MRI of the brain was ordered however due to financial constraints he has not been able to have this done.  A complete neurological exams was undertaken today and was unremarkable

## 2019-03-11 NOTE — Assessment & Plan Note (Signed)
Hypertension: Very well at goal  BP Readings from Last 3 Encounters:  03/11/19 124/63  02/12/19 128/76  12/10/18 (!) 141/75   Plan: -Continue lisinopril 20 mg daily -Continue spironolactone 25 mg daily -Continue doxazosin 8 mg daily -An order for BMP will be placed

## 2019-03-11 NOTE — Progress Notes (Signed)
Internal Medicine Clinic Attending  Case discussed with Dr. Agyei at the time of the visit.  We reviewed the resident's history and exam and pertinent patient test results.  I agree with the assessment, diagnosis, and plan of care documented in the resident's note.    

## 2019-03-12 ENCOUNTER — Other Ambulatory Visit (INDEPENDENT_AMBULATORY_CARE_PROVIDER_SITE_OTHER): Payer: Self-pay

## 2019-03-12 DIAGNOSIS — I10 Essential (primary) hypertension: Secondary | ICD-10-CM

## 2019-03-13 ENCOUNTER — Encounter: Payer: Self-pay | Admitting: Internal Medicine

## 2019-03-13 ENCOUNTER — Telehealth: Payer: Self-pay

## 2019-03-13 LAB — BMP8+ANION GAP
Anion Gap: 16 mmol/L (ref 10.0–18.0)
BUN/Creatinine Ratio: 22 — ABNORMAL HIGH (ref 9–20)
BUN: 20 mg/dL (ref 6–24)
CO2: 20 mmol/L (ref 20–29)
Calcium: 9.3 mg/dL (ref 8.7–10.2)
Chloride: 108 mmol/L — ABNORMAL HIGH (ref 96–106)
Creatinine, Ser: 0.9 mg/dL (ref 0.76–1.27)
GFR calc Af Amer: 108 mL/min/{1.73_m2} (ref >59)
GFR, EST NON AFRICAN AMERICAN: 94 mL/min/{1.73_m2} (ref >59)
Glucose: 91 mg/dL (ref 65–99)
Potassium: 4.1 mmol/L (ref 3.5–5.2)
Sodium: 144 mmol/L (ref 134–144)

## 2019-03-13 NOTE — Telephone Encounter (Signed)
Requesting lab results. Please call back.  

## 2019-03-13 NOTE — Telephone Encounter (Signed)
Hi Hme,   I called several times but no one answered. I have sent a letter of the labs  Thanks

## 2019-03-16 ENCOUNTER — Other Ambulatory Visit: Payer: Self-pay | Admitting: Internal Medicine

## 2019-03-25 MED FILL — JANUMET 50-500 MG TABLET: 50-500 | 30 days supply | Qty: 60 | Fill #0

## 2019-04-08 ENCOUNTER — Telehealth: Payer: Self-pay | Admitting: Internal Medicine

## 2019-04-08 NOTE — Telephone Encounter (Signed)
  [name] Acosta is a 59 y.o. male who was contacted on behalf of [facility] triage nurse. His main concern is should he go back to taking 2 janumet/day instead of 1? (He says in January it was cut back to once a day) However, he also mentions Swelling since it got warmer, feels like he is picking up fluid.stomach, legs(can see fingerprint) are 2-3x usual size. Weight- scale battery died 1-2 months ago so no rencet weight Last was 188# 1-2 months ago  Diet:  Eats 2 meals/day low carb/no bread or potatoes,skips lunch 2-3 eggs in am sausage bacon/12- meat no bread/meat, spinach black eyes; Orange 3-6/week Tell me about how drink to care for your diabetes water, diet pepsi, unsweet koolaid, coffee Salt intake- "slim to none", uses some salt at table, black pepper, he does his cooking, sister food shops, hamburger with sharp cheddar.   Monitoring:  Are you checking blood sugars at home? Yes,1- 2x/day Results: Sunday- 216-209-hands were clean, 128, this am 130, 170-180-190, takes two janumet on those days and the next morning it would be back down again.  symptoms of high and low blood sugar: He reports urinating more since change in meds, hungrier  Sleep: little to none due to pain, sleeps in a recliner or office chair due to sinuses What time do you go to sleep? If at all 2am wakes about 530 am, seldom naps  Labs/Anthropometrics:  Lab Results  Component Value Date   HGBA1C 5.5 03/11/2019   HGBA1C 10.1 (A) 11/05/2018   HGBA1C 7.1 (A) 06/04/2018    Wt Readings from Last 5 Encounters:  03/11/19 196 lb 11.2 oz (89.2 kg)  02/12/19 199 lb 11.2 oz (90.6 kg)  12/10/18 197 lb 11.2 oz (89.7 kg)  11/25/18 202 lb 6.4 oz (91.8 kg)  11/05/18 208 lb (94.3 kg)   Plan:  Date of follow-up visit scheduled- none at this time in our office Any other questions or concerns?" no. Agreed to have me mail a list of healthy low sodium foods choices Informed him I would send this note to his doctor for a  repsonse  Debera Lat, RD 04/08/2019 3:05 PM.

## 2019-04-08 NOTE — Telephone Encounter (Signed)
Pt would like a call back about his BS that have been running high X 2 weeks.

## 2019-04-10 NOTE — Telephone Encounter (Signed)
It appears that per chart review he was cut back to one Janumet tablet daily in March. I do not have enough information to recommend a change in therapy at this time. I would advise that he continue this until we can speak later. With regard to the edema, I will attempt to contact him Monday when I am back on the [place] but would advise that he check his weight at home daily. He will need a new set of scales or batteris. In the meantime if he develops shortness of breath, gains greater than 2lbs in one day or 5lbs in three days or other concerns then he should come to [facility]. He will need a BMP to check his renal function and electrolytes and likely an echo at some point.   Please schedule the patient with me for a Wednesday (April 15th) afternoon televisit. I am to have continuity clinic on the 15th as per Dr. Zenovia Jarred most recent email.   Thank you,  Georgia Lopes

## 2019-04-14 NOTE — Telephone Encounter (Signed)
Appt sch for 04/15/2019.

## 2019-04-14 NOTE — Telephone Encounter (Addendum)
Tried calling patient back to notify him of Dr. Nelma Rothman response. No answer and was not able to leave a message.  Call mobil phone and [name] Acosta reports that he "Got his diabetes back under control" by "starving" himself. I told him that Dr. Nelma Rothman recommendation was to not change his Janumet. I also notified him of his appointment by phone with Dr. Berline Lopes tomorrow for his swelling. He says he can be reached best on his mobile phone.

## 2019-04-15 ENCOUNTER — Encounter: Payer: Self-pay | Admitting: Internal Medicine

## 2019-04-15 ENCOUNTER — Other Ambulatory Visit: Payer: Self-pay

## 2019-04-15 ENCOUNTER — Ambulatory Visit (INDEPENDENT_AMBULATORY_CARE_PROVIDER_SITE_OTHER): Payer: Self-pay | Admitting: Internal Medicine

## 2019-04-15 DIAGNOSIS — G43B0 Ophthalmoplegic migraine, not intractable: Secondary | ICD-10-CM

## 2019-04-15 DIAGNOSIS — E119 Type 2 diabetes mellitus without complications: Secondary | ICD-10-CM

## 2019-04-15 DIAGNOSIS — M170 Bilateral primary osteoarthritis of knee: Secondary | ICD-10-CM

## 2019-04-15 DIAGNOSIS — R600 Localized edema: Secondary | ICD-10-CM

## 2019-04-15 MED ORDER — SUMATRIPTAN SUCCINATE 6 MG/0.5ML ~~LOC~~ SOLN: 6.0000 mg | SUBCUTANEOUS | 0 refills | Status: DC | PRN

## 2019-04-15 NOTE — Progress Notes (Signed)
Medicine attending: Medical history, presenting problems, , and medications, reviewed with resident physician Dr Kathi Ludwig on the day of the patient telephone consultation and I concur with his evaluation and management plan. Ongoing eval of chronic debilitating knee pain due to DJD.Being seen intermittently in local pain clinic. No recent X-Ray studies. We will start w plain films. MRI if otherwise indicated. Diabetes meds adjusted.

## 2019-04-15 NOTE — Assessment & Plan Note (Signed)
Refilled Sumatriptan.

## 2019-04-15 NOTE — Assessment & Plan Note (Signed)
DMII: The patient stated that his home CBG readings have ranged from 88-274 with several in the lower two hundreds and a high but unspecified number in the lower one hundreds. He was not able to provide an average. He did endorse significant polyuria and polydipsia with generalized weakness while on one tablet of his Janumet. In addition, he has stopped eating as many carbs as possible with decreased intake of breads, grits, sweet foods and candy. He now eats primarily protein.  He denied, chest pain, headache, visual changes, lightheadedness, weakness, dizziness on standing, but endorses swelling in the feet or ankles that is chronic.   Plan: Continue Janumet 50-500mg  but resume BID dosing.  Will need repeat A1c in June. I have advised him to consume more fruits and vegetables in addition to the protein for now but have commended him on decreasing his intake of carbohydrates. I further advised that he should not completely cut out all of his grains but should include a small portion at least daily.

## 2019-04-15 NOTE — Progress Notes (Signed)
  [facility] Telephone Encounter Continuity Care Appointment  HPI:   This telephone encounter was created for [name] on 04/15/2019 for the following purpose, continuity clinic for DMII, chronic pain and bilateral leg edema.   Past Medical History:  Past Medical History:  Diagnosis Date  . Chest pain 05/14/2016   Chest pain   . Hyperlipidemia   . Hypertension   . Prostate disorder   . Renal calculus   . Right hip pain 12/12/2015      ROS:  Review of Systems  Constitutional: Negative for chills, diaphoresis, fever and malaise/fatigue.  HENT: Negative for ear pain and sinus pain.   Respiratory: Negative for cough and shortness of breath.   Cardiovascular: Negative for chest pain.  Genitourinary: Positive for frequency. Negative for dysuria, flank pain and urgency.  Musculoskeletal: Positive for back pain (Chronic lumbar pain) and joint pain (Bilateral knee pain, chronic). Negative for myalgias.  Neurological: Negative for dizziness and headaches.     Assessment / Plan / Recommendations:   Please see A&P under problem oriented charting for assessment of the patient's acute and chronic medical conditions.   As always, pt is advised that if symptoms worsen or new symptoms arise, they should go to an urgent care facility or to to ER for further evaluation.   Consent and Medical Decision Making:   Patient discussed with Dr. Beryle Beams.  This is a telephone encounter between [name] and [employer] on 04/15/2019 for DMII, and chronic pain. The visit was conducted with the patient located at home and Kathi Ludwig at [facility]. The patient's identity was confirmed using their DOB and current address. The patient has consented to being evaluated through a telephone encounter and understands the associated risks (an examination cannot be done and the patient may need to come in for an appointment) / benefits (allows the patient to  remain at home, decreasing exposure to coronavirus). I personally spent 26 minutes on medical discussion.

## 2019-04-15 NOTE — Addendum Note (Signed)
Addended by: Nicola Girt on: 04/15/2019 02:13 PM   Modules accepted: Orders

## 2019-04-15 NOTE — Assessment & Plan Note (Signed)
Chronic, intermittent, not associated with dyspnea, cough or abdominal distention, symptoms predate Echo from June 2019 which demonstrated an EF of 65-70% and near global trivial valvular regurgitation. Unclear etiology. Considering cardiac, renal and hepatic as well as other secondary causes of lymphedema.  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 Most recent CMP from 2016 absent albuminemia.  Given the bilateral and intermittent nature I would be less likely to suspect an acute vascular process. I do feel however, that he warrants evaluation with a CBC w/ diff and a CMP as I am uncertain if this is a overload issue, decreased oncotic pressure issue of lymph system obstruction from a cancerous process. Will need to consider imaging for primary cancers and complete a physical exam for lymphadenopathy.   Plan: CBC w/ diff and CMP ordered

## 2019-04-15 NOTE — Assessment & Plan Note (Signed)
Bilateral osteoarthritis of the knees continues to be a prominent concern for the patient. He stated the his pain management clinic requested bilateral MRI's but no plain films are noted on file. Given his physical exam imaging is indicated and I will move forward with plain films of his knees as this should be a more affordable option given his lack of insurance. He understands that treatment is limited to consideration for surgery as well as opioids due to his past episode of life threatening gastric bleeding and nephropathy.   Plan: Bilateral xrays of the knees Recommend NSAIDS cessation Advised him to pursue financial assistance.

## 2019-04-16 ENCOUNTER — Other Ambulatory Visit: Payer: Self-pay | Admitting: *Deleted

## 2019-04-16 DIAGNOSIS — R600 Localized edema: Secondary | ICD-10-CM

## 2019-04-16 DIAGNOSIS — E119 Type 2 diabetes mellitus without complications: Secondary | ICD-10-CM

## 2019-04-17 ENCOUNTER — Telehealth: Payer: Self-pay | Admitting: *Deleted

## 2019-04-17 NOTE — Telephone Encounter (Signed)
Call made to [facility] scheduled for bilateral knee films.  Pt self pay-estimated cost of imaging around $222 and pt will need to bring $25 (will be billed for the remaining).  Pt will received a discounted price of $133.20 if he can pay in full at the time of the visit.  Pt can go the [facility] ([address] location) Mon-Fri 8-4:30.  Pt also informed to go to [facility] for his labs ([place]) Mon-Fri 7:30-4:30. They will also offer pt a discounted price if he's able to pay in full at time of service. Pt will call if he has any additional questions or concerns.Despina Hidden Cassady4/17/20203:54 PM

## 2019-04-18 ENCOUNTER — Other Ambulatory Visit: Payer: Self-pay | Admitting: Internal Medicine

## 2019-04-18 DIAGNOSIS — G43B0 Ophthalmoplegic migraine, not intractable: Secondary | ICD-10-CM

## 2019-04-20 NOTE — Telephone Encounter (Signed)
Next appt scheduled 6/24 with PCP.

## 2019-04-23 ENCOUNTER — Other Ambulatory Visit: Payer: Self-pay

## 2019-04-23 ENCOUNTER — Ambulatory Visit
Admission: RE | Admit: 2019-04-23 | Discharge: 2019-04-23 | Disposition: A | Discharge status: Home / Self Care | Payer: No Typology Code available for payment source | Source: Ambulatory Visit | Attending: Internal Medicine | Admitting: Internal Medicine

## 2019-04-23 DIAGNOSIS — M170 Bilateral primary osteoarthritis of knee: Secondary | ICD-10-CM

## 2019-05-06 ENCOUNTER — Telehealth: Payer: Self-pay | Admitting: Internal Medicine

## 2019-05-06 NOTE — Telephone Encounter (Signed)
He had labs here 3/11: HbA1c normal @ 5.5%; BMET within normal limits, glucose 112, normal kidney function. He was supposed to have repeat labs on 4/16 to include a CBC but these were never done.

## 2019-05-06 NOTE — Telephone Encounter (Signed)
Pt is calling regarding results [phone]

## 2019-05-07 NOTE — Telephone Encounter (Signed)
Pt answered ph today, stated he is too busy to wait to hear from "you people". He stated he wanted to know about his xrays. States he needs something done for his knee pain, that he can hardly stand it. Suggested he call [place], apply for medicaid, everything was met with a negative answer. Wished him well and ended call. 

## 2019-05-18 MED FILL — JANUMET 50-500 MG TABLET: 50-500 | 30 days supply | Qty: 60 | Fill #1

## 2019-06-02 ENCOUNTER — Ambulatory Visit
Admission: RE | Admit: 2019-06-02 | Discharge: 2019-06-02 | Disposition: A | Discharge status: Home / Self Care | Payer: Self-pay | Source: Ambulatory Visit | Attending: Internal Medicine | Admitting: Internal Medicine

## 2019-06-02 ENCOUNTER — Other Ambulatory Visit: Payer: Self-pay

## 2019-06-02 DIAGNOSIS — G44019 Episodic cluster headache, not intractable: Secondary | ICD-10-CM | POA: Insufficient documentation

## 2019-06-05 ENCOUNTER — Other Ambulatory Visit: Payer: Self-pay

## 2019-06-05 ENCOUNTER — Ambulatory Visit
Admission: RE | Admit: 2019-06-05 | Discharge: 2019-06-05 | Disposition: A | Discharge status: Home / Self Care | Payer: Self-pay | Source: Ambulatory Visit | Attending: Internal Medicine | Admitting: Internal Medicine

## 2019-06-05 DIAGNOSIS — M545 Low back pain, unspecified: Secondary | ICD-10-CM

## 2019-06-05 DIAGNOSIS — G8929 Other chronic pain: Secondary | ICD-10-CM | POA: Insufficient documentation

## 2019-06-05 DIAGNOSIS — M170 Bilateral primary osteoarthritis of knee: Secondary | ICD-10-CM

## 2019-06-08 ENCOUNTER — Telehealth: Payer: Self-pay | Admitting: *Deleted

## 2019-06-08 NOTE — Telephone Encounter (Signed)
Call [phone] Pt calls and states someone called him and he missed the call because he was in MRI machine, please advise

## 2019-06-08 NOTE — Telephone Encounter (Signed)
I did not call. 

## 2019-06-09 NOTE — Telephone Encounter (Signed)
Attempted to contact patient to discuss his imaging results. There was no answer. I will attempt to contact him again when able.

## 2019-06-09 NOTE — Telephone Encounter (Signed)
Pt is calling regarding MRI results [phone]

## 2019-06-10 ENCOUNTER — Other Ambulatory Visit: Payer: Self-pay

## 2019-06-15 ENCOUNTER — Telehealth: Payer: Self-pay | Admitting: Internal Medicine

## 2019-06-15 ENCOUNTER — Telehealth: Payer: Self-pay

## 2019-06-15 DIAGNOSIS — M4310 Spondylolisthesis, site unspecified: Secondary | ICD-10-CM | POA: Insufficient documentation

## 2019-06-15 DIAGNOSIS — M4300 Spondylolysis, site unspecified: Secondary | ICD-10-CM

## 2019-06-15 MED ORDER — GABAPENTIN 300 MG PO CAPS: 300.0000 mg | ORAL_CAPSULE | Freq: Three times a day (TID) | ORAL | 0 refills | Status: DC

## 2019-06-15 NOTE — Telephone Encounter (Signed)
Called patient for the forth attempt to notify him of his MRI results. No answer on either number left in chart, voicemail not yet set up. I recommend a neurosurgery referral urgently given the results of the MRI of the lumbar spine and a orthopedic referral for the knee.  I have placed the referral to neurosurgery. Please notify the patient that I will need to speak with him soon regarding this issue.

## 2019-06-15 NOTE — Telephone Encounter (Signed)
Patient in severe pain, initially the right hip but now migrating to the center of his back and radiating into his left flank. He feels that both legs and knees are weak and have been now for about four weeks. He denied loss of bowel or bladder control, Denied numbness/tingling in feet.  Right hip pain chronic, after lying flat for MRI pain is now worse in center of his back. Stated that there remains pain in the center of his lower back.  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   Plan: Continue neurosurgery referral: I have notified the clinic staff in an attempt to help expedite the neurosurgery referral.  Gabapentin 300mg  TID #30. Booked for Wednesday at 1:15 for evaluation with me in the [facility] Patient advised to return call or visit the ED if his symptoms worsen.

## 2019-06-15 NOTE — Telephone Encounter (Signed)
Returned call, no answer. Please have patient call back.

## 2019-06-15 NOTE — Telephone Encounter (Signed)
Requesting MRI results. Please call pt back.

## 2019-06-16 ENCOUNTER — Other Ambulatory Visit: Payer: Self-pay

## 2019-06-17 ENCOUNTER — Other Ambulatory Visit: Payer: Self-pay

## 2019-06-17 ENCOUNTER — Ambulatory Visit (INDEPENDENT_AMBULATORY_CARE_PROVIDER_SITE_OTHER): Payer: Self-pay | Admitting: Internal Medicine

## 2019-06-17 VITALS — BP 143/86 | HR 88 | Temp 98.0°F

## 2019-06-17 DIAGNOSIS — M4300 Spondylolysis, site unspecified: Secondary | ICD-10-CM

## 2019-06-17 DIAGNOSIS — Z79899 Other long term (current) drug therapy: Secondary | ICD-10-CM

## 2019-06-17 DIAGNOSIS — M4316 Spondylolisthesis, lumbar region: Secondary | ICD-10-CM

## 2019-06-17 DIAGNOSIS — M545 Low back pain, unspecified: Secondary | ICD-10-CM

## 2019-06-17 DIAGNOSIS — E119 Type 2 diabetes mellitus without complications: Secondary | ICD-10-CM

## 2019-06-17 DIAGNOSIS — I10 Essential (primary) hypertension: Secondary | ICD-10-CM

## 2019-06-17 DIAGNOSIS — Z7984 Long term (current) use of oral hypoglycemic drugs: Secondary | ICD-10-CM

## 2019-06-17 DIAGNOSIS — G8929 Other chronic pain: Secondary | ICD-10-CM

## 2019-06-17 LAB — POCT GLYCOSYLATED HEMOGLOBIN (HGB A1C): Hemoglobin A1C: 5.4 % (ref 4.0–5.6)

## 2019-06-17 LAB — GLUCOSE, CAPILLARY: Glucose-Capillary: 102 mg/dL — ABNORMAL HIGH (ref 70–99)

## 2019-06-17 MED ORDER — PREDNISONE 20 MG PO TABS: ORAL_TABLET | ORAL | 0 refills | Status: DC

## 2019-06-17 NOTE — Patient Instructions (Signed)
FOLLOW-UP INSTRUCTIONS When: 4-6 wks For: Evaluation of your back pain What to bring: All of your medications  I have added a short steroid course to your medications today. This short course should help reduce the swelling and thus the pain in your back. We are working diligently on a referral to a neurosurgeon and are hopeful this will go through soon.  While on the steroids, your blood sugar levels will rise, if at any time you notice that your levels are rising to above 300 consistently please notify us right away. In addition, please be aware to monitor for signs of high blood sugar such as dizziness, increase peeing, increased thirst, fatigue or other unusual symptoms.  Thank you for your visit to the [facility] today. If you have any questions or concerns please call us at [phone].

## 2019-06-17 NOTE — Progress Notes (Signed)
   CC: back pain and HTN  HPI:[name] Acosta is a 59 y.o. male who presents for evaluation of back pain, HTN and DMII. Please see individual problem based A/P for details.  Past Medical History:  Diagnosis Date  . Chest pain 05/14/2016   Chest pain   . Hyperlipidemia   . Hypertension   . Prostate disorder   . Renal calculus   . Right hip pain 12/12/2015   Review of Systems:  ROS negative except as per HPI.  Physical Exam: Vitals:   06/17/19 1315  BP: (!) 143/86  Pulse: 88  Temp: 98 F (36.7 C)  TempSrc: Oral  SpO2: 99%   General: A/O x4, in no acute distress, afebrile, nondiaphoretic Cardio: RRR, no mrg's  Pulmonary: CTA bilaterally, no wheezing or crackles  Abdomen: Bowel sounds normal, soft, nontender  MSK: BLE nontender, tenderness to palpation to the lateral aspects of L5-S1, pain with any ROM of the hips and lumbar spine,  Neuro: Strength 4.5/5 bilateral lower extremities effort limited by pain, sensation intact to bilateral distal extremities Psych: Appropriate affect, not depressed in appearance, engages well  Assessment & Plan:   See Encounters Tab for problem based charting.  Patient discussed with Dr. Angelia Mould

## 2019-06-19 ENCOUNTER — Telehealth: Payer: Self-pay | Admitting: Pharmacist

## 2019-06-19 ENCOUNTER — Encounter: Payer: Self-pay | Admitting: Internal Medicine

## 2019-06-19 DIAGNOSIS — E119 Type 2 diabetes mellitus without complications: Secondary | ICD-10-CM

## 2019-06-19 MED ORDER — ACCU-CHEK AVIVA VI STRP: ORAL_STRIP | 11 refills | Status: AC

## 2019-06-19 MED ORDER — OXYCODONE-ACETAMINOPHEN 5-325 MG PO TABS: 1.0000 | ORAL_TABLET | ORAL | 0 refills | Status: DC | PRN

## 2019-06-19 NOTE — Progress Notes (Addendum)
Patient was contacted for DM steroid initiative (prescribed prednisone course). Patient does not currently check BG at home. He states he has a BG meter, but needs test strips. Prescriptions sent to pharmacy for test strips and advised patient to check BG at least twice daily. Patient verbalized understanding.  Patient called back 06/24/2019 and states home BG have been in the 100s, completed course of prednisone. Singing off of case and advised patient to contact clinic if questions arise. Patient verbalized understanding.

## 2019-06-19 NOTE — Assessment & Plan Note (Signed)
Acutely worsened, see spondylolisthesis from same date.

## 2019-06-19 NOTE — Assessment & Plan Note (Signed)
  Back pain: Spondylolisthesis with bilateral L5 neural impingement.  I personally reviewed the MRI on 06/06/2019 and agree with the finding of anterolisthesis at L5-S1 and bilateral spondylolysis with likely bilateral L5 neural impingment. Given the severity of the patients symptoms and imaging findings I have referred him to neurosurgery urgently.  Today he states the pain is only gotten worse since we last spoke, he is unable to ambulate for greater than 10 to 15 feet, he is unable to sleep due to the severity of the pain, he stated his legs feel weak bilaterally, he denies loss of bowel or bladder control, he is unable to find a position that relieves the pain, the pain is rated at 10/10 with ambulation and 7/10 while sitting still.  He denies suicidal ideations but states that this continues to go on like this is not willing to continue to live.  I spoke with the in call neurosurgeon at [facility] today who recommended optimizing pain management medically and recommending physical therapy.  In addition I spoke to the neurosurgeon on call for [facility] he recommended opioid-based analgesic and agreed with prednisone course.  In addition I would like to see the patient urgently in the clinic but feel physical therapy will most likely be the most definitive treatment given his condition.  Plan: Neurosurgery consult placed and underway Continue Gabapentin 300mg  BID Prednisone 40mg  dialy x 3 days then pred 20mg  daily for three days Percocet 5 mg every 4 hours as needed for 5 days

## 2019-06-19 NOTE — Assessment & Plan Note (Signed)
  DMII: Hemoglobin A1c 5.4 this presentation.  The patient which is good compliance to his significant metformin 50/500 mg daily.  He denies urinary frequency.  Plan:  Continue glipizide metformin Patient is advised to contact our office if he notes significant elevations in CBGs given his short prednisone course Have donna call him regarding his CBG levels

## 2019-06-19 NOTE — Assessment & Plan Note (Signed)
Hypertension: Patient's BP today is 143/86 with a goal of <140/80 which given the severity of his pain is currently separable. The patient endorses adherence to his medication regimen. He denied, chest pain, headache, visual changes, lightheadedness, weakness, dizziness on standing, swelling in the feet or ankles.   Plan: Continue lisinopril 20mg  daily Continue spironolactone 25 mg daily

## 2019-06-22 NOTE — Progress Notes (Signed)
Internal Medicine Clinic Attending  Case discussed with Dr. Harbrecht at the time of the visit.  We reviewed the resident's history and exam and pertinent patient test results.  I agree with the assessment, diagnosis, and plan of care documented in the resident's note.   

## 2019-06-24 ENCOUNTER — Encounter: Payer: Self-pay | Admitting: Internal Medicine

## 2019-06-24 ENCOUNTER — Telehealth: Payer: Self-pay | Admitting: Internal Medicine

## 2019-06-24 DIAGNOSIS — M4300 Spondylolysis, site unspecified: Secondary | ICD-10-CM

## 2019-06-24 MED ORDER — OXYCODONE-ACETAMINOPHEN 5-325 MG PO TABS: 1.0000 | ORAL_TABLET | Freq: Three times a day (TID) | ORAL | 0 refills | Status: DC | PRN

## 2019-06-24 NOTE — Telephone Encounter (Signed)
Called patient to discuss his current treatment for his spondylolisthesis.  Patient states that his pain is slightly improved with the oxycodone is persistent.  He has been trying to utilize the pain medication as sparingly as possible for fear of running out.  By advised the patient that he should be taking it as written in and attempt to better controll his pain while we await for his neurosurgery referral to be completed. Given the patient's continued symptoms, imaging findings, and physical exam findings he will need evaluation by neurosurgery for more definitive treatment.  I been advised by the neurosurgeon on call whom I contacted following the patient's previous visit, that he will most likely advised the patient to do physical therapy initially.  As the patient does not currently have insurance I advised [name] Acosta today that obtaining insurance of the most importance with regard to progress with treating his current condition.  Refilled oxycodone 5/325 every 8 hours as needed back pain at 90 tablets to [place].  This is a chronic medication for I believe to be chronic pain  Kathi Ludwig, MD [facility] Internal Medicine, PGY-2

## 2019-07-11 ENCOUNTER — Other Ambulatory Visit: Payer: Self-pay | Admitting: Internal Medicine

## 2019-07-11 DIAGNOSIS — I10 Essential (primary) hypertension: Secondary | ICD-10-CM

## 2019-07-11 DIAGNOSIS — N400 Enlarged prostate without lower urinary tract symptoms: Secondary | ICD-10-CM

## 2019-07-14 ENCOUNTER — Telehealth: Payer: Self-pay | Admitting: Internal Medicine

## 2019-07-14 DIAGNOSIS — E119 Type 2 diabetes mellitus without complications: Secondary | ICD-10-CM

## 2019-07-15 NOTE — Telephone Encounter (Signed)
Second call from [facility] requesting Rx for Janumet with "IM Program written in comments. They ar no longer able to accept VO for this. Hubbard Hartshorn, RN, BSN

## 2019-07-16 MED ORDER — JANUMET 50-500 MG PO TABS: ORAL_TABLET | ORAL | 2 refills | Status: DC

## 2019-07-16 MED FILL — JANUMET 50-500 MG TABLET: 50-500 | 30 days supply | Qty: 60 | Fill #0

## 2019-07-16 NOTE — Addendum Note (Signed)
Addended by: Nicola Girt on: 07/16/2019 12:27 AM   Modules accepted: Orders

## 2019-07-20 ENCOUNTER — Other Ambulatory Visit: Payer: Self-pay | Admitting: Internal Medicine

## 2019-07-27 ENCOUNTER — Other Ambulatory Visit: Payer: Self-pay | Admitting: Internal Medicine

## 2019-07-27 DIAGNOSIS — M4300 Spondylolysis, site unspecified: Secondary | ICD-10-CM

## 2019-07-27 MED ORDER — OXYCODONE-ACETAMINOPHEN 5-325 MG PO TABS: 1.0000 | ORAL_TABLET | Freq: Three times a day (TID) | ORAL | 0 refills | Status: DC | PRN

## 2019-07-27 MED FILL — JANUMET 50-500 MG TABLET: 50-500 | 30 days supply | Qty: 60 | Fill #0

## 2019-07-27 NOTE — Telephone Encounter (Signed)
Refill Request  Pt states his 1st Pain Management  Appt is with Dr. Annette Stable @ [facility]  On 08/07/2019 and his will run out and is requesting a refill until his appt.  oxyCODONE-acetaminophen (PERCOCET) 5-325 MG tablet(Expired) [place],  - [place]  Patient requesting a refill for his diabetes medicine.  PT states he fills this at the [facility] but will need to have someone authorize this.  sitaGLIPtin-metformin (JANUMET) 50-500 MG tablet

## 2019-07-27 NOTE — Telephone Encounter (Signed)
Will refill until her is able to follow with Dr. Annette Stable. Will refill script to permit him time to meet with Dr. Annette Stable.

## 2019-07-27 NOTE — Telephone Encounter (Signed)
Pt rtn phone call. 

## 2019-07-27 NOTE — Telephone Encounter (Signed)
Called pt - stated [facility] pharmacy is requesting an authorization before Janumet can be filled. I will send request to [facility].  Oxycodone was last refilled on 6/24.  Last OV was 06/17/19.

## 2019-07-27 NOTE — Telephone Encounter (Signed)
[name] Acosta stated med is on $4 list; no PA needed.

## 2019-08-28 NOTE — Addendum Note (Signed)
Addended by: Nicola Girt on: 08/28/2019 10:15 AM   Modules accepted: Orders

## 2019-09-09 ENCOUNTER — Telehealth: Payer: Self-pay | Admitting: Internal Medicine

## 2019-09-09 NOTE — Telephone Encounter (Signed)
Pt is angry, states he was sent to neuro that sent him to pain clinic and now they want to give him 2 injections in his back at 1800. Each and he does not have one 1800.00 much less two 1800.00's appt [facility] 9/10 at 1545

## 2019-09-09 NOTE — Telephone Encounter (Signed)
Pt requesting a callback [phone]

## 2019-09-10 ENCOUNTER — Ambulatory Visit (INDEPENDENT_AMBULATORY_CARE_PROVIDER_SITE_OTHER): Payer: Self-pay | Admitting: Internal Medicine

## 2019-09-10 ENCOUNTER — Other Ambulatory Visit: Payer: Self-pay

## 2019-09-10 DIAGNOSIS — M4300 Spondylolysis, site unspecified: Secondary | ICD-10-CM

## 2019-09-10 DIAGNOSIS — I10 Essential (primary) hypertension: Secondary | ICD-10-CM

## 2019-09-10 DIAGNOSIS — E119 Type 2 diabetes mellitus without complications: Secondary | ICD-10-CM

## 2019-09-10 DIAGNOSIS — M170 Bilateral primary osteoarthritis of knee: Secondary | ICD-10-CM

## 2019-09-10 DIAGNOSIS — M47816 Spondylosis without myelopathy or radiculopathy, lumbar region: Secondary | ICD-10-CM

## 2019-09-10 DIAGNOSIS — Z79899 Other long term (current) drug therapy: Secondary | ICD-10-CM

## 2019-09-10 DIAGNOSIS — Z23 Encounter for immunization: Secondary | ICD-10-CM

## 2019-09-10 DIAGNOSIS — Z79891 Long term (current) use of opiate analgesic: Secondary | ICD-10-CM

## 2019-09-10 DIAGNOSIS — M48061 Spinal stenosis, lumbar region without neurogenic claudication: Secondary | ICD-10-CM

## 2019-09-10 DIAGNOSIS — N400 Enlarged prostate without lower urinary tract symptoms: Secondary | ICD-10-CM

## 2019-09-10 MED ORDER — DULOXETINE HCL 60 MG PO CPEP: 60.0000 mg | ORAL_CAPSULE | Freq: Every day | ORAL | 2 refills | Status: DC

## 2019-09-10 MED ORDER — OXYCODONE-ACETAMINOPHEN 5-325 MG PO TABS: 1.0000 | ORAL_TABLET | Freq: Three times a day (TID) | ORAL | 0 refills | Status: DC | PRN

## 2019-09-10 NOTE — Patient Instructions (Signed)
It was a pleasure to see you today Mr. Foltyn. Please make the following changes:  -I will get records from your neurologist's office and discuss with him  -Please start taking cymbalta 60mg  daily for numbness  -Please take percocet every 8hrs as needed for pain  If you have any questions or concerns, please call our clinic at [phone] between 9am-5pm and after hours call [phone] and ask for the internal medicine resident on call. If you feel you are having a medical emergency please call 911.   Thank you, we look forward to help you remain healthy!  Lars Mage, MD Internal Medicine PGY3

## 2019-09-10 NOTE — Assessment & Plan Note (Addendum)
The patient states that he has been having lower back pain that travels up spine to head. Patient states that he has been habing this pain for the past 3 months. Patient states that the pain is 10/10 intensity, aggravated when he lays down or if he moves, alleviated when he sits in his truck seat and percocet. With associated left leg numbness.   MRI lumbar spine done 06/05/19 showed 56mm anterolisthesis l5-s1 related to bilateral l5 spondylolysis, severe bilateral L5 neural impingement, 2 mmm retrolisthesis at l4-l5, and bilateral foraminal narrowing that is worse on left.   The patient states that he was seen by neurosurgeon Dr. Trenton Gammon who referred the patient to pain clinic. The pain clinic apparently told patient that they would be able to give him injections that would cost $1000 each.   Assessment and plan  The patient would like to discuss options for treatment rather than temporary pain relief. Mentioned that we will request request records from neurosurgery and will discuss plan with them. For the time being prescribed #36 of percocet 5-325mg  q8hrs prn and also startedon cymbalta 60mg  qd.

## 2019-09-10 NOTE — Progress Notes (Signed)
[place]  CC: Back pain  HPI:  [name] is a 59 y.o. 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 Please see problem based charting for evaluation, assessment, and plan.  Past Medical History:  Diagnosis Date  . Chest pain 05/14/2016   Chest pain   . Hyperlipidemia   . Hypertension   . Prostate disorder   . Renal calculus   . Right hip pain 12/12/2015   Review of Systems:    Review of Systems  Constitutional: Negative for chills and fever.  Respiratory: Negative for cough and shortness of breath.   Musculoskeletal: Positive for joint pain.  Neurological: Negative for dizziness and headaches.   Physical Exam:  Vitals:   09/10/19 1557  BP: 122/71  Pulse: 78  Temp: 97.9 F (36.6 C)  TempSrc: Oral  SpO2: 99%  Weight: 198 lb 11.2 oz (90.1 kg)  Height: 5\' 9"  (1.753 m)   Physical Exam  Constitutional: He is oriented to person, place, and time. He appears well-developed and well-nourished. He appears distressed.  Patient sitting in wheelchair  HENT:  Head: Normocephalic and atraumatic.  Cardiovascular: Normal rate, regular rhythm and normal heart sounds.  Respiratory: Effort normal and breath sounds normal. No respiratory distress. He has no wheezes.  GI: Soft. Bowel sounds are normal. He exhibits no distension. There is no abdominal tenderness.  Musculoskeletal:        General: Tenderness (throughout the spine, but especially remarable in lower back) present. No edema.     Comments: 2/5 muscle strength in right lower extremity, 1/5 strength in left lower extremity. Limited rom  Neurological: He is alert and oriented to person, place, and time.  Skin: He is not diaphoretic.  Psychiatric: He has a normal mood and affect. His behavior is normal. Judgment and thought content normal.   Assessment & Plan:   See Encounters Tab for problem based charting.  Patient discussed with Dr. Dareen Piano

## 2019-09-11 NOTE — Progress Notes (Signed)
Internal Medicine Clinic Attending  Case discussed with Dr. Chundi at the time of the visit.  We reviewed the resident's history and exam and pertinent patient test results.  I agree with the assessment, diagnosis, and plan of care documented in the resident's note. 

## 2019-09-15 ENCOUNTER — Ambulatory Visit: Payer: Self-pay | Admitting: Cardiovascular Disease

## 2019-09-24 ENCOUNTER — Ambulatory Visit (INDEPENDENT_AMBULATORY_CARE_PROVIDER_SITE_OTHER): Payer: Self-pay | Admitting: Cardiovascular Disease

## 2019-09-24 ENCOUNTER — Encounter: Payer: Self-pay | Admitting: Cardiovascular Disease

## 2019-09-24 ENCOUNTER — Other Ambulatory Visit: Payer: Self-pay

## 2019-09-24 VITALS — BP 116/68 | HR 112 | Ht 69.0 in | Wt 196.0 lb

## 2019-09-24 DIAGNOSIS — M79604 Pain in right leg: Secondary | ICD-10-CM | POA: Insufficient documentation

## 2019-09-24 DIAGNOSIS — M79605 Pain in left leg: Secondary | ICD-10-CM

## 2019-09-24 DIAGNOSIS — R0609 Other forms of dyspnea: Secondary | ICD-10-CM

## 2019-09-24 DIAGNOSIS — Z008 Encounter for other general examination: Secondary | ICD-10-CM

## 2019-09-24 DIAGNOSIS — F17200 Nicotine dependence, unspecified, uncomplicated: Secondary | ICD-10-CM

## 2019-09-24 DIAGNOSIS — I10 Essential (primary) hypertension: Secondary | ICD-10-CM

## 2019-09-24 HISTORY — DX: Pain in right leg: M79.605

## 2019-09-24 NOTE — Assessment & Plan Note (Signed)
History of dyslipidemia on statin therapy with lipid profile performed 06/18/2016 revealing total cholesterol 143, LDL of 80 and HDL 34.

## 2019-09-24 NOTE — Progress Notes (Signed)
09/24/2019 [name] Acosta   Aug 23, 1960  [id number]  Primary Physician Kathi Ludwig, MD Primary Cardiologist: Lorretta Harp MD Garret [facility], [place], [place]  HPI:  [name] Acosta is a 59 y.o.  mildly overweight separated Caucasian male father of one son who currently is out of work. He is referred by his primary care providerfor cardiovascular evaluation because of left leg pain as well as chest pain.  I last saw him in the office 06/13/2016.  His cardiovascular risk factor profile is notable for 80 pack years of tobacco abuse currently smoking one pack per day recalcitrant to resect or modification. He does have a history of treated hypertension and hyperlipidemia. He has never had a heart attack or stroke. This intermittent chest pain. He complains of left leg discomfort and swelling and had a negative arterial Dopplers performed last month.  I obtained lower extremity arterial Dopplers on him 05/08/2016 which were normal.  A Myoview stress test was normal as was a 2D echo.  He continues to complain of some atypical chest pain, shortness of breath probably related to ongoing tobacco abuse and COPD, back and leg pain.  He is on narcotics/analgesics for this.   Current Meds  Medication Sig  . albuterol (PROVENTIL HFA;VENTOLIN HFA) 108 (90 BASE) MCG/ACT inhaler Inhale 2 puffs into the lungs every 6 (six) hours as needed for wheezing or shortness of breath.  . Cholecalciferol (VITAMIN D-3) 1000 units CAPS Take 1 capsule by mouth daily.  Marland Kitchen doxazosin (CARDURA) 8 MG tablet TAKE 1 TABLET BY MOUTH ONCE A DAY WITH BREAKFAST.  Marland Kitchen glucose blood (ACCU-CHEK AVIVA) test strip Test blood glucose up to twice daily before meals. The patient is not insulin requiring, ICD 10 code E11.9.  . guaifenesin (HUMIBID E) 400 MG TABS tablet Take 400 mg by mouth 2 (two) times daily.  Marland Kitchen lisinopril (ZESTRIL) 20 MG tablet TAKE 1 TABLET BY MOUTH ONCE DAILY  . loperamide (IMODIUM) 2 MG capsule Take 2 mg by mouth as needed  for diarrhea or loose stools.  Marland Kitchen loratadine (CLARITIN) 10 MG tablet Take 10 mg by mouth 2 (two) times daily.  Marland Kitchen oxyCODONE-acetaminophen (PERCOCET) 5-325 MG tablet Take 1 tablet by mouth every 8 (eight) hours as needed for up to 15 days for severe pain.  . pravastatin (PRAVACHOL) 20 MG tablet TAKE 1 TABLET BY MOUTH AT BEDTIME  . predniSONE (DELTASONE) 20 MG tablet Please take 40mg  daily for three days followed by 20mg  daily for two days.  . sitaGLIPtin-metformin (JANUMET) 50-500 MG tablet TAKE 1 TABLET BY MOUTH 2 TIMES DAILY WITH A MEAL.  Marland Kitchen SUMAtriptan (IMITREX) 6 MG/0.5ML SOLN injection INJECT 0.5 ML (6 MG TOTAL) INTO THE SKINEVERY 2 HOURS AS NEEDED FOR MIGRAINE OR HEADACHE. MAY REPEAT IN 2 HOURS IF HEADACHE PERSISTS OR RECU  . tiZANidine (ZANAFLEX) 4 MG tablet Take 1 tablet (4 mg total) by mouth 3 (three) times daily.     Allergies  Allergen Reactions  . Codeine     REACTION: Unknown reaction  . Penicillins     Hives and Swelling. Has patient had a PCN reaction causing immediate rash, facial/tongue/throat swelling, SOB or lightheadedness with hypotension:YES Has patient had a PCN reaction causing severe rash involving mucus membranes or skin necrosis: NO Has patient had a PCN reaction that required hospitalization NO Has patient had a PCN reaction occurring within the last 10 years: No If all of the above answers are "NO", then may proceed with Cephalosporin use.  Social History   Socioeconomic History  . Marital status: Legally Separated    Spouse name: Not on file  . Number of children: Not on file  . Years of education: Not on file  . Highest education level: Not on file  Occupational History  . Not on file  Social Needs  . Financial resource strain: Not on file  . Food insecurity    Worry: Not on file    Inability: Not on file  . Transportation needs    Medical: Not on file    Non-medical: Not on file  Tobacco Use  . Smoking status: Current Every Day Smoker     Packs/day: 2.00    Years: 44.00    Pack years: 88.00    Types: Cigarettes  . Smokeless tobacco: Never Used  . Tobacco comment: 2-3 ppd with an average of 2  Substance and Sexual Activity  . Alcohol use: No    Alcohol/week: 0.0 standard drinks    Comment: Quit drinking in 1988  . Drug use: Not on file  . Sexual activity: Not on file  Lifestyle  . Physical activity    Days per week: Not on file    Minutes per session: Not on file  . Stress: Not on file  Relationships  . Social Herbalist on phone: Not on file    Gets together: Not on file    Attends religious service: Not on file    Active member of club or organization: Not on file    Attends meetings of clubs or organizations: Not on file    Relationship status: Not on file  . Intimate partner violence    Fear of current or ex partner: Not on file    Emotionally abused: Not on file    Physically abused: Not on file    Forced sexual activity: Not on file  Other Topics Concern  . Not on file  Social History Narrative  . Not on file     Review of Systems: General: negative for chills, fever, night sweats or weight changes.  Cardiovascular: negative for chest pain, dyspnea on exertion, edema, orthopnea, palpitations, paroxysmal nocturnal dyspnea or shortness of breath Dermatological: negative for rash Respiratory: negative for cough or wheezing Urologic: negative for hematuria Abdominal: negative for nausea, vomiting, diarrhea, bright red blood per rectum, melena, or hematemesis Neurologic: negative for visual changes, syncope, or dizziness All other systems reviewed and are otherwise negative except as noted above.    Blood pressure 116/68, pulse (!) 112, height 5\' 9"  (1.753 m), weight 196 lb (88.9 kg), SpO2 98 %.  General appearance: alert and no distress Neck: no adenopathy, no carotid bruit, no JVD, supple, symmetrical, trachea midline and thyroid not enlarged, symmetric, no tenderness/mass/nodules Lungs:  clear to auscultation bilaterally Heart: regular rate and rhythm, S1, S2 normal, no murmur, click, rub or gallop Extremities: extremities normal, atraumatic, no cyanosis or edema Pulses: 2+ and symmetric Skin: Skin color, texture, turgor normal. No rashes or lesions Neurologic: Alert and oriented X 3, normal strength and tone. Normal symmetric reflexes. Normal coordination and gait  EKG sinus tachycardia at 112 with septal Q waves.  I personally reviewed this EKG.  ASSESSMENT AND PLAN:   TOBACCO ABUSE History of ongoing tobacco abuse of 1 to 2 packs a day recalcitrant to respect modification.  Dyslipidemia History of dyslipidemia on statin therapy with lipid profile performed 06/18/2016 revealing total cholesterol 143, LDL of 80 and HDL 34.  Essential hypertension History  of essential potential blood pressure measured today 116/68.  He is on lisinopril and spironolactone.  Dyspnea on exertion History of chronic dyspnea exertion with a normal 2D echo and negative Myoview stress test will long history of ongoing tobacco abuse.  I suspect his dyspnea is related to COPD.  Bilateral leg pain History of bilateral leg pain with Dopplers performed 05/08/2016 revealing normal ABIs bilaterally.  I do not think he has claudication.      Lorretta Harp MD FACP,FACC,FAHA, [facility] 09/24/2019 3:21 PM

## 2019-09-24 NOTE — Assessment & Plan Note (Signed)
History of bilateral leg pain with Dopplers performed 05/08/2016 revealing normal ABIs bilaterally.  I do not think he has claudication.

## 2019-09-24 NOTE — Assessment & Plan Note (Signed)
History of essential potential blood pressure measured today 116/68.  He is on lisinopril and spironolactone.

## 2019-09-24 NOTE — Patient Instructions (Signed)
Medication Instructions:  Your physician recommends that you continue on your current medications as directed. Please refer to the Current Medication list given to you today.  If you need a refill on your cardiac medications before your next appointment, please call your pharmacy.   Lab work: NONE If you have labs (blood work) drawn today and your tests are completely normal, you will receive your results only by: Marland Kitchen MyChart Message (if you have MyChart) OR . A paper copy in the mail If you have any lab test that is abnormal or we need to change your treatment, we will call you to review the results.  Testing/Procedures: NONE  Follow-Up: At [facility], you and your health needs are our priority.  As part of our continuing mission to provide you with exceptional heart care, we have created designated Provider Care Teams.  These Care Teams include your primary Cardiologist (physician) and Advanced Practice Providers (APPs -  Physician Assistants and Nurse Practitioners) who all work together to provide you with the care you need, when you need it. . You will need a follow up appointment AS NEEDED. You may see Dr. Gwenlyn Found or one of the following Advanced Practice Providers on your designated Care Team:   . Kerin Ransom, PA-C . Daleen Snook Kroeger, PA-C . Sande Rives, PA-C ____________________ . Almyra Deforest, PA-C . Fabian Sharp, PA-C . Jory Sims, DNP . Rosaria Ferries, PA-C

## 2019-09-24 NOTE — Assessment & Plan Note (Signed)
History of ongoing tobacco abuse of 1 to 2 packs a day recalcitrant to respect modification.

## 2019-09-24 NOTE — Assessment & Plan Note (Signed)
History of chronic dyspnea exertion with a normal 2D echo and negative Myoview stress test will long history of ongoing tobacco abuse.  I suspect his dyspnea is related to COPD.

## 2019-09-30 MED FILL — JANUMET 50-500 MG TABLET: 50-500 | 30 days supply | Qty: 60 | Fill #1

## 2019-10-01 ENCOUNTER — Telehealth: Payer: Self-pay | Admitting: Internal Medicine

## 2019-10-01 DIAGNOSIS — M4300 Spondylolysis, site unspecified: Secondary | ICD-10-CM

## 2019-10-01 MED ORDER — OXYCODONE-ACETAMINOPHEN 5-325 MG PO TABS: 1.0000 | ORAL_TABLET | Freq: Three times a day (TID) | ORAL | 0 refills | Status: DC | PRN

## 2019-10-01 NOTE — Telephone Encounter (Signed)
I returned a call to the patient today as requested. He reiterates the same concerns. His back pain is not improved, the oxycodone only take the pain down from a 10 to a 7-8 but this is tolearable to him. He remains unable to afford the injections at $1800 each and does not have insurance. I discussed the neurosurgeons care plan based on his note that was faxed to me, I also discussed his current pain medication regimen. He is aware that the oxycodone is not a permanent solution and only a bridge to more definitive therapy. Unfortunately, his lack of insurance is an ongoing issue.   I advised the patient of the following;  He will need to obtain insurance for which I recommended beginning with the [place] website as well as contacting our clinic and speaking with Marisue Brooklyn our financial counselor.  I further advised that he schedule an appointment with me in November. I will refill his oxycodone for now until I can see him again at my next available appointment.  He will need a Urine tox screen at that visit as well.

## 2019-10-01 NOTE — Telephone Encounter (Signed)
Patient called speaking in a raised voice about his Pain Medication and not being able to afford the Injections in his back from his Pain Medicine Doctor. Pt states in a loud tone, "I don't understand why I have to stretch out my pain medications because my Doctor ain't explaining anything to me, I ain't gonna kept going to get no injections in my back that ain't working. Somebody needs to call me back"  Please call patient back.

## 2019-10-02 NOTE — Telephone Encounter (Signed)
Spoke with the patient this morning.  Appt has been sch on 11/11/2019 with his PCP.

## 2019-10-27 ENCOUNTER — Telehealth: Payer: Self-pay

## 2019-10-27 NOTE — Telephone Encounter (Signed)
Requesting to speak with a nurse about hip pain and pain meds. Please call pt back.

## 2019-10-27 NOTE — Telephone Encounter (Signed)
Called pt - stated hip pain is becoming worse; taking Oxycodone. He wants to know if there's something else he can take along w/Oxy such as Ibuprofen? Thanks

## 2019-10-27 NOTE — Telephone Encounter (Signed)
Called /asked pt if he has a hx of Gi bleed; he stated "yes". Informed his doctor does not recommend Ibuprofen and will call back of his response.

## 2019-10-27 NOTE — Telephone Encounter (Signed)
Glenda, I was under the impression that the patient had a prior GI bleed but cannot find this documented in the chart except from within my notes.  And previously advised against NSAIDs for that reason.  Please contact the patient and if he further denies a history of a GI bleed I would recommend ibuprofen 600mg  3 times daily for up to two weeks.

## 2019-10-29 NOTE — Telephone Encounter (Signed)
Called pt - no answer ; mailbox full unable to leave a message.

## 2019-10-29 NOTE — Telephone Encounter (Signed)
Thank you Glenda. Given his history of GI bleed he I do not recommend NSAIDs. I have little more that I can help him with. I have referred him to the neurosurgeon who recommended injections he has not been able to afford. If this pain is new or rapidly worsening I would recommend that he be seen. He has a history of knee and hip pain but I am concerned that this may be progression of his spinal disease. At this time I would advise him to either be seen in the clinic if new/worse or continue current therapy. He is not advised to use NSAIDS or additional tylenol.

## 2019-11-02 ENCOUNTER — Telehealth: Payer: Self-pay | Admitting: *Deleted

## 2019-11-02 NOTE — Telephone Encounter (Signed)
Pt rtc to office, gave him dr harbrecht's advice, sch appt 11/4 dr Berline Lopes at [phone]

## 2019-11-03 NOTE — Telephone Encounter (Signed)
Per Epic, pt has an appt on 11/4.

## 2019-11-03 NOTE — Telephone Encounter (Signed)
Thank you :)

## 2019-11-04 ENCOUNTER — Encounter: Payer: Self-pay | Admitting: Internal Medicine

## 2019-11-04 ENCOUNTER — Other Ambulatory Visit: Payer: Self-pay

## 2019-11-04 ENCOUNTER — Ambulatory Visit (INDEPENDENT_AMBULATORY_CARE_PROVIDER_SITE_OTHER): Payer: Self-pay | Admitting: Internal Medicine

## 2019-11-04 DIAGNOSIS — Z79891 Long term (current) use of opiate analgesic: Secondary | ICD-10-CM

## 2019-11-04 DIAGNOSIS — M4310 Spondylolisthesis, site unspecified: Secondary | ICD-10-CM

## 2019-11-04 DIAGNOSIS — G8929 Other chronic pain: Secondary | ICD-10-CM

## 2019-11-04 DIAGNOSIS — M4316 Spondylolisthesis, lumbar region: Secondary | ICD-10-CM

## 2019-11-04 DIAGNOSIS — R269 Unspecified abnormalities of gait and mobility: Secondary | ICD-10-CM

## 2019-11-04 NOTE — Patient Instructions (Signed)
FOLLOW-UP INSTRUCTIONS When: 2 months or sooner if needed What to bring: All of your medications  I have not made any changes to your medications today. Please discuss obtaining insurance as I feel the injections are the best route for you to obtain symptom relief.   Thank you for your visit to the [facility] today. If you have any questions or concerns please call us at [phone].

## 2019-11-04 NOTE — Progress Notes (Signed)
   CC: Lower back pain  HPI:[name] is a 59 y.o. male who presents for evaluation of acute on chronic lower back pain. Please see individual problem based A/P for details.  Past Medical History:  Diagnosis Date  . Chest pain 05/14/2016   Chest pain   . Hyperlipidemia   . Hypertension   . Prostate disorder   . Renal calculus   . Right hip pain 12/12/2015   Review of Systems:  ROS negative except as per HPI.  Physical Exam: Vitals:   11/04/19 1548  BP: 124/80  Pulse: 90  Temp: 99.1 F (37.3 C)  TempSrc: Oral  SpO2: 99%  Weight: 198 lb 9.6 oz (90.1 kg)  Height: 5\' 9"  (1.753 m)   General: A/O x4, in no acute distress, afebrile, nondiaphoretic Cardio: RRR, no mrg's  Pulmonary: CTA bilaterally, no wheezing or crackles  MSK: Marked pain on palpation of the L4-L5 and L6-S1 area Neuro: Gait impaired, wide stance, unable to rise without uses of her body from a seated position, unstable on his feet Psych: Appropriate affect, not depressed in appearance, engages well  Assessment & Plan:   See Encounters Tab for problem based charting.  Patient discussed with Dr. Dareen Piano

## 2019-11-05 ENCOUNTER — Other Ambulatory Visit: Payer: Self-pay

## 2019-11-05 ENCOUNTER — Encounter: Payer: Self-pay | Admitting: Internal Medicine

## 2019-11-05 DIAGNOSIS — M4300 Spondylolysis, site unspecified: Secondary | ICD-10-CM

## 2019-11-05 MED ORDER — OXYCODONE-ACETAMINOPHEN 5-325 MG PO TABS: 1.0000 | ORAL_TABLET | Freq: Three times a day (TID) | ORAL | 0 refills | Status: DC | PRN

## 2019-11-05 NOTE — Assessment & Plan Note (Signed)
Anterolisthesis: The patient again endorses notable hip and back pain.  There is radiation of the pain into his upper outer hip and thigh area.  There is agitation of the pain with walking.  He has marked impairment with standing requiring use of his upper body.  The hip pain initiates a more encompassing issue after it tightens with minimal activity.  He is unable to complete his ADLs or work. This develolps into a hard sharp pain that radiates down his legs bilaterally but maintains most of its symptoms in the hip girdle.   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 Worse with laying flat, walking, stooping, squatting, bending, climbing, standing, twisting, or any involvement of the lower spine or hips.  The pain is 10 out of 10 at times never reducing to less than 2/10 while using the Percocet.  He averages a 6/10 without Percocet. The patient had a 9 foot fall from ladder in 11/2018 and a fall in 1991 as well.  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 Patient endorses a history of gastrointestinal bleed reducing her ability to utilize NSAIDs and/or corticosteroids short course.  He has been evaluated by neurosurgery who are recommending injections for which she does not have the funds as he is uninsured. I have little more to offer at this time and believe the patient's best recourse is to pursue disability and/or alternative insurance.  I do not feel that he has the ability to work without first receiving substantial treatment and even so it is most likely that he would not be able to return to his former profession.  Plan: Refill Percocet 90 tablets 3 times daily for 30 days -Continue to encourage the patient to obtain insurance -He has been advised and agrees that his current treatment is only temporary -Discussed tramadol at his next visit -We will a urine drug screen and  opioid pain management contract if we are to continue the current process at his next visit

## 2019-11-05 NOTE — Telephone Encounter (Signed)
Return call to pt - stated he went to the pharmacy and there was no new medication for pain. Stated his doctor told him yesterday, he was going to add something else for pain. And he only has 6 pills left. Thanks

## 2019-11-05 NOTE — Telephone Encounter (Signed)
Requesting to speak with a nurse about meds. Please call pt back.  

## 2019-11-06 NOTE — Progress Notes (Signed)
Internal Medicine Clinic Attending  Case discussed with Dr. Harbrecht at the time of the visit.  We reviewed the resident's history and exam and pertinent patient test results.  I agree with the assessment, diagnosis, and plan of care documented in the resident's note.   

## 2019-11-06 NOTE — Telephone Encounter (Signed)
Pt informed of Oxycodone rx.

## 2019-11-11 ENCOUNTER — Other Ambulatory Visit: Payer: Self-pay

## 2019-11-11 ENCOUNTER — Encounter: Payer: Self-pay | Admitting: Internal Medicine

## 2019-11-11 ENCOUNTER — Ambulatory Visit (INDEPENDENT_AMBULATORY_CARE_PROVIDER_SITE_OTHER): Payer: Self-pay | Admitting: Internal Medicine

## 2019-11-11 VITALS — BP 120/70 | HR 90 | Temp 98.5°F | Ht 69.0 in | Wt 195.5 lb

## 2019-11-11 DIAGNOSIS — F1190 Opioid use, unspecified, uncomplicated: Secondary | ICD-10-CM

## 2019-11-11 DIAGNOSIS — E119 Type 2 diabetes mellitus without complications: Secondary | ICD-10-CM

## 2019-11-11 DIAGNOSIS — Z79891 Long term (current) use of opiate analgesic: Secondary | ICD-10-CM

## 2019-11-11 DIAGNOSIS — Z7984 Long term (current) use of oral hypoglycemic drugs: Secondary | ICD-10-CM

## 2019-11-11 DIAGNOSIS — M4310 Spondylolisthesis, site unspecified: Secondary | ICD-10-CM

## 2019-11-11 DIAGNOSIS — F17200 Nicotine dependence, unspecified, uncomplicated: Secondary | ICD-10-CM

## 2019-11-11 DIAGNOSIS — M4316 Spondylolisthesis, lumbar region: Secondary | ICD-10-CM

## 2019-11-11 DIAGNOSIS — G8929 Other chronic pain: Secondary | ICD-10-CM

## 2019-11-11 DIAGNOSIS — I10 Essential (primary) hypertension: Secondary | ICD-10-CM

## 2019-11-11 DIAGNOSIS — Z79899 Other long term (current) drug therapy: Secondary | ICD-10-CM

## 2019-11-11 DIAGNOSIS — F17210 Nicotine dependence, cigarettes, uncomplicated: Secondary | ICD-10-CM

## 2019-11-11 LAB — POCT GLYCOSYLATED HEMOGLOBIN (HGB A1C): Hemoglobin A1C: 5.4 % (ref 4.0–5.6)

## 2019-11-11 LAB — GLUCOSE, CAPILLARY: Glucose-Capillary: 101 mg/dL — ABNORMAL HIGH (ref 70–99)

## 2019-11-11 MED ORDER — GABAPENTIN 300 MG PO CAPS: 300.0000 mg | ORAL_CAPSULE | Freq: Three times a day (TID) | ORAL | 2 refills | Status: DC

## 2019-11-11 MED ORDER — OXYCODONE-ACETAMINOPHEN 5-325 MG PO TABS: 1.0000 | ORAL_TABLET | Freq: Three times a day (TID) | ORAL | 0 refills | Status: DC | PRN

## 2019-11-11 MED ORDER — METFORMIN HCL ER 500 MG PO TB24: 500.0000 mg | ORAL_TABLET | Freq: Every day | ORAL | 2 refills | Status: DC

## 2019-11-11 MED FILL — GABAPENTIN 300 MG CAPSULE: 300 | 30 days supply | Qty: 90 | Fill #0

## 2019-11-11 MED FILL — METFORMIN HCL ER 500 MG TB2: 500 | 30 days supply | Qty: 30 | Fill #0

## 2019-11-11 NOTE — Assessment & Plan Note (Signed)
Anterolisthesis: Pain persists. Better today than last week. He uses the percocet sparingly but does require this at least 3 times per day in order to function. We will need to continue to pursue the disability application in order to obtain more definitive treatment. Caution use of NSAIDS or corticosteroids due to GI bleed history. Tramadol has caused severe side effects in the past. Gabapentin provides minimal benefit but we will retry this today in conjunction with the percocet at low dose.    Plan:  Continue percocet 5/325 TID Add gaba 300mg  TID PRN

## 2019-11-11 NOTE — Patient Instructions (Signed)
FOLLOW-UP INSTRUCTIONS When: 3-4 months For: Routine visit What to bring: All of your medications  I have recommended that you start Gabapentin 300mg  up to three times per day as needed for radiating back pain. Do not take this or the percocet and drive as use is associated with drowsiness. I recommend use before bed or in the evening. I have decreased the diabetes medications to metformin alone. Please notify us if you sugar levels rise and we will resume the Janumet. Please continue the lisinopril for your blood pressure.   Please continue to work on a disability application to obtain insurance to assist with the spinal area injections.   Thank you for your visit to the [facility] today. If you have any questions or concerns please call us at [phone].

## 2019-11-11 NOTE — Assessment & Plan Note (Signed)
  DMII: Hgb A1c 5.4 % at the last. Current A1c 5.4%. The patient denied polyuria, polydipsia, headache, fatigue, confusion, nausea, vomiting or diaphoresis. He is to monitor his glucose at home and if it rises we will resume the [place].   Plan: Continue Metformin alone at 500mg  long acting Please hold Janumet for now Repeat A1c at next visit

## 2019-11-11 NOTE — Assessment & Plan Note (Signed)
  Continues to smoke almost a pack per day. Discussed cessation to which he replied that as his pain improves he has been able to decrease the use somewhat. I will continue to approach the topic as we improve his symptoms.

## 2019-11-11 NOTE — Assessment & Plan Note (Signed)
Hypertension: Patient's BP today is 120/870 with a goal of <140/80. The patient endorses adherence to his medication regimen. He denied, chest pain, headache, visual changes, lightheadedness, weakness, dizziness on standing, swelling in the feet or ankles.   Plan: Continue Lisinopril 10mg  daily He denied use of the Spironolactone  BMP at this visit, repeat at next

## 2019-11-11 NOTE — Assessment & Plan Note (Signed)
Uncomplicated opioid use: Due to chronic pain now. Have tried other therapies without benefit. Will continue to monitor. Discuss signing a pain contract if he is to remain on this medication at his next visit. We continue to pursue alternative treatment modalities but are greatly limited. See Anterolisthesis for additional information.  Plan: Refill percocet #90 monthly at 5/325mg  TID dosing [employer] screening obtained today Database reviewed

## 2019-11-11 NOTE — Progress Notes (Signed)
   CC: Radicular back pain, DMII and HTN  HPI:[name] is a 59 y.o. male who presents for evaluation of back/leg/hip pain, DMII and HTN. Please see individual problem based A/P for details.   Past Medical History:  Diagnosis Date  . Chest pain 05/14/2016   Chest pain   . Hyperlipidemia   . Hypertension   . Prostate disorder   . Renal calculus   . Right hip pain 12/12/2015   Review of Systems:  ROS negative except as per HPI.  Physical Exam: Vitals:   11/11/19 1527  BP: 120/70  Pulse: 90  Temp: 98.5 F (36.9 C)  TempSrc: Oral  SpO2: 98%  Weight: 195 lb 8 oz (88.7 kg)  Height: 5\' 9"  (1.753 m)   General: A/O x4, in no acute distress, afebrile, nondiaphoretic HEENT: PEERL, EMO intact Cardio: RRR, no mrg's  Pulmonary: CTA bilaterally, no wheezing or crackles  Abdomen: Bowel sounds normal, soft, nontender  MSK: BLE nontender, nonedematous Neuro: Marked pain in the left lower lumbar region. Strength 4/5 left lower leg, 5/5 RLE. Sensation intact.  Psych: Appropriate affect, not depressed in appearance, engages well  Assessment & Plan:   See Encounters Tab for problem based charting.  Patient discussed with Dr. Daryll Drown

## 2019-11-12 LAB — BMP8+ANION GAP
Anion Gap: 14 mmol/L (ref 10.0–18.0)
BUN/Creatinine Ratio: 17 (ref 9–20)
BUN: 16 mg/dL (ref 6–24)
CO2: 21 mmol/L (ref 20–29)
Calcium: 9.2 mg/dL (ref 8.7–10.2)
Chloride: 108 mmol/L — ABNORMAL HIGH (ref 96–106)
Creatinine, Ser: 0.95 mg/dL (ref 0.76–1.27)
GFR calc Af Amer: 102 mL/min/{1.73_m2} (ref >59)
GFR calc non Af Amer: 88 mL/min/{1.73_m2} (ref >59)
Glucose: 97 mg/dL (ref 65–99)
Potassium: 4.1 mmol/L (ref 3.5–5.2)
Sodium: 143 mmol/L (ref 134–144)

## 2019-11-13 NOTE — Progress Notes (Signed)
Internal Medicine Clinic Attending  Case discussed with Dr. Harbrecht at the time of the visit.  We reviewed the resident's history and exam and pertinent patient test results.  I agree with the assessment, diagnosis, and plan of care documented in the resident's note.   

## 2019-11-14 LAB — TOXASSURE SELECT,+ANTIDEPR,UR

## 2019-12-09 ENCOUNTER — Telehealth: Payer: Self-pay | Admitting: Internal Medicine

## 2019-12-09 ENCOUNTER — Ambulatory Visit (INDEPENDENT_AMBULATORY_CARE_PROVIDER_SITE_OTHER): Payer: Self-pay | Admitting: Internal Medicine

## 2019-12-09 ENCOUNTER — Other Ambulatory Visit: Payer: Self-pay

## 2019-12-09 ENCOUNTER — Other Ambulatory Visit: Payer: Self-pay | Admitting: Internal Medicine

## 2019-12-09 ENCOUNTER — Encounter: Payer: Self-pay | Admitting: Internal Medicine

## 2019-12-09 DIAGNOSIS — G8929 Other chronic pain: Secondary | ICD-10-CM

## 2019-12-09 DIAGNOSIS — M4300 Spondylolysis, site unspecified: Secondary | ICD-10-CM

## 2019-12-09 DIAGNOSIS — M4310 Spondylolisthesis, site unspecified: Secondary | ICD-10-CM

## 2019-12-09 DIAGNOSIS — E119 Type 2 diabetes mellitus without complications: Secondary | ICD-10-CM

## 2019-12-09 DIAGNOSIS — Z7984 Long term (current) use of oral hypoglycemic drugs: Secondary | ICD-10-CM

## 2019-12-09 DIAGNOSIS — Z79899 Other long term (current) drug therapy: Secondary | ICD-10-CM

## 2019-12-09 MED ORDER — JANUMET 50-500 MG PO TABS: 1.0000 | ORAL_TABLET | Freq: Every day | ORAL | 5 refills | Status: DC

## 2019-12-09 MED ORDER — CYCLOBENZAPRINE HCL 7.5 MG PO TABS: 7.5000 mg | ORAL_TABLET | Freq: Three times a day (TID) | ORAL | 2 refills | Status: DC | PRN

## 2019-12-09 MED ORDER — JANUMET 50-500 MG PO TABS: 1.0000 | ORAL_TABLET | Freq: Two times a day (BID) | ORAL | 5 refills | Status: DC

## 2019-12-09 MED FILL — JANUMET 50-500 MG TABLET: 50-500 | 30 days supply | Qty: 30 | Fill #0

## 2019-12-09 MED FILL — CYCLOBENZAPRINE 5 MG TABLET: 5 | 10 days supply | Qty: 45 | Fill #0

## 2019-12-09 NOTE — Telephone Encounter (Signed)
Pt calls and states he has taken himself off the gabapentin and metformin. He states that the gabapentin was causing his legs to swell, they are better now. He states the metformin caused him to "pee" all the time, so he would rather have janumet. He states he cannot come to an appt because of transportation issues. Made TELEHEALTH APPT FOR 12/9 [facility] AT [id number]- dr Berline Lopes would be preferred.

## 2019-12-09 NOTE — Telephone Encounter (Signed)
Thank you :)

## 2019-12-09 NOTE — Assessment & Plan Note (Signed)
Anterolisthesis: Radicular pain improved gabapentin but patient feels that the opinion is induced a bilateral peripheral edema.  This resolved when he stopped taking gabapentin.  I am unfamiliar with such a relationship we have agreed to discontinue gabapentin for now.  We will trial a course of Flexeril.  I advised the patient that Flexeril may benefit him with regard to muscle tension resulting in relaxation of the muscle but would not decrease the neuropathic portion or radicular pain.  He had a recent fall continues to have severe back pain discretely improved with the Percocet.  Plan: Flexeril 30 tablets monthly Stop gabapentin

## 2019-12-09 NOTE — Telephone Encounter (Signed)
Thank you Helen.. I agree 

## 2019-12-09 NOTE — Assessment & Plan Note (Addendum)
  DMII: Patient calls today to endorse prominent polyuria after stopping the Janumet.  I am concerned this represents decreased control with diabetes.  We had initially stopped this due to a decrease in his hemoglobin A1c to 5.4 from 10.1.  Given his polyuria polydipsia we will resume Janumet but at 50-500 and stop Metformin alone.  He does not check his glucose at home as such were unable to determine what this might be.  He does not describe symptoms consistent with early DKA nor does he appear to be at risk for such.  Plan: Repeat A1c next visit Janumet 50-500 mg daily

## 2019-12-09 NOTE — Telephone Encounter (Signed)
Pharmacy calls and states they do not stock 7.5 flexeril, wanted to know if they could do 5mg  tabs #45, take 1 1/2 tabs to = 7.5mg , VO given, are you good with this? If so can you change medlist?

## 2019-12-09 NOTE — Telephone Encounter (Signed)
Please call patient back in regard to his diabetes medications and is requesting a call back from a nurse.

## 2019-12-09 NOTE — Addendum Note (Signed)
Addended by: Nicola Girt on: 12/09/2019 03:31 PM   Modules accepted: Orders

## 2019-12-09 NOTE — Progress Notes (Signed)
I connected with  [name] Acosta on 12/09/19 by a video enabled telemedicine application and verified that I am speaking with the correct person using two identifiers.   I discussed the limitations of evaluation and management by telemedicine. The patient expressed understanding and agreed to proceed.    CC: Diabetes and back pain  HPI:[name] is a 59 y.o. male who presents for evaluation of diabetes and back pain. Please see individual problem based A/P for details.  Past Medical History:  Diagnosis Date  . Chest pain 05/14/2016   Chest pain   . Hyperlipidemia   . Hypertension   . Prostate disorder   . Renal calculus   . Right hip pain 12/12/2015   Review of Systems:  ROS negative except as per HPI.  This was a televisit Assessment & Plan:   See Encounters Tab for problem based charting.  Patient discussed with Dr. Dareen Piano

## 2019-12-10 ENCOUNTER — Other Ambulatory Visit: Payer: Self-pay | Admitting: Internal Medicine

## 2019-12-10 DIAGNOSIS — M4300 Spondylolysis, site unspecified: Secondary | ICD-10-CM

## 2019-12-10 NOTE — Progress Notes (Signed)
Internal Medicine Clinic Attending  Case discussed with Dr. Harbrecht at the time of the visit.  We reviewed the resident's history and exam and pertinent patient test results.  I agree with the assessment, diagnosis, and plan of care documented in the resident's note.   

## 2019-12-11 NOTE — Telephone Encounter (Signed)
Last office visit: 12/09/19 Last UDS: 11/11/19 Last Refill: last written 11/11/19 #90 Next appt: 02/24/20

## 2019-12-14 ENCOUNTER — Other Ambulatory Visit: Payer: Self-pay | Admitting: *Deleted

## 2019-12-14 DIAGNOSIS — F1190 Opioid use, unspecified, uncomplicated: Secondary | ICD-10-CM

## 2019-12-14 DIAGNOSIS — M4310 Spondylolisthesis, site unspecified: Secondary | ICD-10-CM

## 2019-12-14 NOTE — Telephone Encounter (Signed)
Pt is calling regarding his pain medicine, pls contact [phone]

## 2019-12-14 NOTE — Telephone Encounter (Signed)
Call from [place], stated Oxy rx was sent to them instead of [facility]. She will cancel rxs written in Nov. Called pt - confirmed correct pharmacy is [facility] which costs $30 compare to over $100.

## 2019-12-15 MED ORDER — OXYCODONE-ACETAMINOPHEN 5-325 MG PO TABS: 1.0000 | ORAL_TABLET | Freq: Three times a day (TID) | ORAL | 0 refills | Status: DC | PRN

## 2020-01-07 MED FILL — JANUMET 50-500 MG TABLET: 50-500 | 30 days supply | Qty: 30 | Fill #1

## 2020-01-08 ENCOUNTER — Other Ambulatory Visit: Payer: Self-pay | Admitting: Internal Medicine

## 2020-01-08 DIAGNOSIS — I10 Essential (primary) hypertension: Secondary | ICD-10-CM

## 2020-01-08 DIAGNOSIS — N400 Enlarged prostate without lower urinary tract symptoms: Secondary | ICD-10-CM

## 2020-01-11 ENCOUNTER — Telehealth: Payer: Self-pay | Admitting: Internal Medicine

## 2020-01-11 ENCOUNTER — Other Ambulatory Visit: Payer: Self-pay | Admitting: *Deleted

## 2020-01-11 NOTE — Telephone Encounter (Signed)
Patient requesting a call back in reference to having loose stools x 1 month.  Patient would like to know what he can take.

## 2020-01-11 NOTE — Telephone Encounter (Signed)
Called pt - informed/explained telehealth appt; he's agreeable. Teleheath app scheduled tomorrow @ 0845 AM; informed pt to have his phone near by. Stated ok.

## 2020-01-11 NOTE — Telephone Encounter (Signed)
error 

## 2020-01-11 NOTE — Telephone Encounter (Signed)
Called pt - stated over the last month he has been having pasty stools, sometimes cannot control. He has used Imodium AD but not on a regular basis b/c he does not want to get constipated from taking pain medication. 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 Does he need an in person visit or can u send something to his pharmacy? Thanks

## 2020-01-11 NOTE — Telephone Encounter (Signed)
It appears the patient is concerned for persistent loose stool? If so, I do believe a tele visit could suffice initially with him stopping by for labs at a later date if indicated.

## 2020-01-12 ENCOUNTER — Other Ambulatory Visit: Payer: Self-pay

## 2020-01-12 ENCOUNTER — Ambulatory Visit (INDEPENDENT_AMBULATORY_CARE_PROVIDER_SITE_OTHER): Payer: Self-pay | Admitting: Internal Medicine

## 2020-01-12 ENCOUNTER — Encounter: Payer: Self-pay | Admitting: Internal Medicine

## 2020-01-12 DIAGNOSIS — R195 Other fecal abnormalities: Secondary | ICD-10-CM | POA: Insufficient documentation

## 2020-01-12 DIAGNOSIS — Z8719 Personal history of other diseases of the digestive system: Secondary | ICD-10-CM

## 2020-01-12 DIAGNOSIS — F17210 Nicotine dependence, cigarettes, uncomplicated: Secondary | ICD-10-CM

## 2020-01-12 DIAGNOSIS — R152 Fecal urgency: Secondary | ICD-10-CM

## 2020-01-12 HISTORY — DX: Other fecal abnormalities: R19.5

## 2020-01-12 NOTE — Assessment & Plan Note (Addendum)
Patient calls and with one week history of pasty, gummy dark stools. States for the last week throughout the day he has had intermittent urges to defecate and then will subsequently have to rush to the bathroom. This has resulted in a couple accidents. He has had something similar to this in the past when he was diagnosed with erosive esophagitis/gastritis. He denies pain, nausea/vomiting, fevers, weight loss, change in appetite. He has not noticed any correlation eating. He denies the use of NSAIDs including ibuprofen, Motrin, Aleve, goodie powder, BC powder. He denies the use of alcohol. He does smoke one half pack per day.  Review of records indicates that he an EGD in 2009 that showed erosive esophagitis, gastritis, and duondenitis. His colonoscopy in 2009 also showed inflammation of the terminal ileum that could be indicative of IBD.  A/P: - Need to rule out a G.I. bleed. - STAT CBC - Discussed that patient needs to seek medical attention immediately. If he begins to experience lightheadedness, dizziness, shortness of breath, chest pain, or bright red blood per rectum for to come in immediately - Will also need referral to G.I.

## 2020-01-12 NOTE — Progress Notes (Signed)
   CC: Loose stools  This is a telephone encounter between [employer] and [employer] on 01/12/2020 for Loose stools. The visit was conducted with the patient located at home and [facility] at [facility]. The patient's identity was confirmed using their DOB and current address. The patient has consented to being evaluated through a telephone encounter and understands the associated risks (an examination cannot be done and the patient may need to come in for an appointment) / benefits (allows the patient to remain at home, decreasing exposure to coronavirus). I personally spent 7 minutes on medical discussion.   HPI:  [name] is a 60 y.o. with PMH as below.   Please see A&P for assessment of the patient's acute and chronic medical conditions.   Past Medical History:  Diagnosis Date  . Chest pain 05/14/2016   Chest pain   . Hyperlipidemia   . Hypertension   . Prostate disorder   . Renal calculus   . Right hip pain 12/12/2015   Review of Systems:  Performed and all others negative.  Assessment & Plan:   See Encounters Tab for problem based charting.  Patient discussed with Dr. Lynnae January

## 2020-01-12 NOTE — Addendum Note (Signed)
Addended byIna Homes T on: 01/12/2020 09:50 AM   Modules accepted: Orders

## 2020-01-13 NOTE — Progress Notes (Signed)
Internal Medicine Clinic Attending  Case discussed with Dr. Helberg at the time of the visit.  We reviewed the resident's history and exam and pertinent patient test results.  I agree with the assessment, diagnosis, and plan of care documented in the resident's note.    

## 2020-01-14 ENCOUNTER — Other Ambulatory Visit: Payer: Self-pay | Admitting: Internal Medicine

## 2020-01-14 DIAGNOSIS — M4310 Spondylolisthesis, site unspecified: Secondary | ICD-10-CM

## 2020-01-14 DIAGNOSIS — F1190 Opioid use, unspecified, uncomplicated: Secondary | ICD-10-CM

## 2020-01-14 NOTE — Telephone Encounter (Signed)
Last rx written  12/15/19. Last OV  12/09/19. Next OV 02/24/20. UDS  11/11/19.

## 2020-01-19 ENCOUNTER — Encounter: Payer: Self-pay | Admitting: Internal Medicine

## 2020-01-19 ENCOUNTER — Ambulatory Visit (INDEPENDENT_AMBULATORY_CARE_PROVIDER_SITE_OTHER): Payer: Self-pay | Admitting: Internal Medicine

## 2020-01-19 ENCOUNTER — Other Ambulatory Visit: Payer: Self-pay

## 2020-01-19 VITALS — BP 120/73 | HR 94 | Temp 98.0°F | Ht 69.0 in | Wt 194.8 lb

## 2020-01-19 DIAGNOSIS — M23205 Derangement of unspecified medial meniscus due to old tear or injury, unspecified knee: Secondary | ICD-10-CM

## 2020-01-19 DIAGNOSIS — Z8719 Personal history of other diseases of the digestive system: Secondary | ICD-10-CM

## 2020-01-19 DIAGNOSIS — M170 Bilateral primary osteoarthritis of knee: Secondary | ICD-10-CM

## 2020-01-19 DIAGNOSIS — R195 Other fecal abnormalities: Secondary | ICD-10-CM

## 2020-01-19 NOTE — Progress Notes (Signed)
   CC: dark stools  HPI:  [name] Acosta is a 60 y.o. with PMH as below.   Please see A&P for assessment of the patient's acute and chronic medical conditions.    Presented one week ago via telehealth visit with one week of dark, gummy stools. He states since that time his dark stools have resolved and his bowel movements have become regular again. He no longer is having urgency or having to rush to the bathroom either. This has occurred once before with history of erosive esophagitis/gastritis. He last had a colonoscopy and EGD in 2009 when this was diagnosed and additionally had inflammation of the ileum, significant for possible IBD.  He denies abdominal pain, nausea, weakness, dizziness, shortness of breath, or chest pain. He no longer uses NSAIDs with his previous history and does not use alcohol. Since his telehealth visit he has gotten in touch with gastroenterology and has an appointment with Dr. Carlean Purl for 2/26.   He continues to have knee pain that makes it difficult to walk. Review of MRI shows complex tear of the medial meniscus. First line treatment would be physical therapy but this has been hindered by patient being uninsured although there has been effort to help him obtain orange card and insurance. Discussed that he can see financial again so that we can arrange for him to get the orange card and begin treatment. He is not interested in financial counseling at this time.  Past Medical History:  Diagnosis Date  . Chest pain 05/14/2016   Chest pain   . Hyperlipidemia   . Hypertension   . Prostate disorder   . Renal calculus   . Right hip pain 12/12/2015   Review of Systems:   10 point ROS negative except as noted in HPI  Physical Exam:   Constitution: NAD, appears stated age Resp: non-labored breathing, on room air  Abdominal: NTTP, soft, non-distended, normal BS Neuro: normal affect, a&ox3 Skin: c/d/i    Vitals:   01/19/20 1412  BP: 120/73  Pulse: 94    Temp: 98 F (36.7 C)  TempSrc: Oral  SpO2: 99%  Weight: 194 lb 12.8 oz (88.4 kg)  Height: 5\' 9"  (1.753 m)     Assessment & Plan:   See Encounters Tab for problem based charting.  Patient discussed with Dr. Philipp Ovens

## 2020-01-19 NOTE — Patient Instructions (Addendum)
Thank you for allowing [place] to provide your care today. Today we discussed your back pain, leg pain, and dark stools.    I have ordered the following labs for you:  Complete blood count   I will call if any are abnormal.    Today we made the following changes to your medications:   Please follow-up with gastroenterology for your scheduled appointment.   Please call the internal medicine center clinic if you have any questions or concerns, we may be able to help and keep you from a long and expensive emergency room wait. Our clinic and after hours phone number is [phone], the best time to call is Monday through Friday 9 am to 4 pm but there is always someone available 24/7 if you have an emergency. If you need medication refills please notify your pharmacy one week in advance and they will send [place] a request.

## 2020-01-20 LAB — CBC WITH DIFFERENTIAL/PLATELET
Basophils Absolute: 0.1 10*3/uL (ref 0.0–0.2)
Basos: 1 %
EOS (ABSOLUTE): 0.2 10*3/uL (ref 0.0–0.4)
Eos: 2 %
Hematocrit: 42 % (ref 37.5–51.0)
Hemoglobin: 14.2 g/dL (ref 13.0–17.7)
Immature Grans (Abs): 0 10*3/uL (ref 0.0–0.1)
Immature Granulocytes: 0 %
Lymphocytes Absolute: 2.1 10*3/uL (ref 0.7–3.1)
Lymphs: 27 %
MCH: 32.3 pg (ref 26.6–33.0)
MCHC: 33.8 g/dL (ref 31.5–35.7)
MCV: 96 fL (ref 79–97)
Monocytes Absolute: 0.6 10*3/uL (ref 0.1–0.9)
Monocytes: 8 %
Neutrophils Absolute: 4.7 10*3/uL (ref 1.4–7.0)
Neutrophils: 62 %
Platelets: 162 10*3/uL (ref 150–450)
RBC: 4.4 x10E6/uL (ref 4.14–5.80)
RDW: 12.5 % (ref 11.6–15.4)
WBC: 7.7 10*3/uL (ref 3.4–10.8)

## 2020-01-20 NOTE — Assessment & Plan Note (Signed)
Presented one week ago via telehealth visit with one week of dark, gummy stools. He states since that time his dark stools have resolved and his bowel movements have become regular again. He no longer is having urgency or having to rush to the bathroom either. This has occurred once before with history of erosive esophagitis/gastritis. He last had a colonoscopy and EGD in 2009 when this was diagnosed and additionally had inflammation of the ileum, significant for possible IBD.  He denies abdominal pain, nausea, weakness, dizziness, shortness of breath, or chest pain. He no longer uses NSAIDs with his previous history and does not use alcohol. Since his telehealth visit he has gotten in touch with gastroenterology and has an appointment with Dr. Carlean Purl for 2/26.   - CBC today - f/u gastroenterology 2/26  ADDENDUM: hemoglobin stable, recommend f/u with GI.

## 2020-01-20 NOTE — Assessment & Plan Note (Signed)
He continues to have knee pain that makes it difficult to walk. Review of MRI shows complex tear of the medial meniscus. First line treatment would be physical therapy but this has been hindered by patient being uninsured although there has been effort to help him obtain orange card and insurance. Discussed that he can see financial again so that we can arrange for him to get the orange card and begin treatment. He is not interested in financial counseling at this time.  - recommended he f/u for financial counseling so we can do physical therapy referral

## 2020-01-25 ENCOUNTER — Telehealth: Payer: Self-pay | Admitting: Internal Medicine

## 2020-01-25 NOTE — Telephone Encounter (Signed)
Pt is calling regarding results [phone]

## 2020-01-26 NOTE — Telephone Encounter (Signed)
Called patient, voicemail was full. His lab results and blood counts were normal. Please let me know if he has further questions.

## 2020-01-28 NOTE — Progress Notes (Signed)
Internal Medicine Clinic Attending  Case discussed with Dr. Seawell at the time of the visit.  We reviewed the resident's history and exam and pertinent patient test results.  I agree with the assessment, diagnosis, and plan of care documented in the resident's note.    

## 2020-01-28 NOTE — Telephone Encounter (Signed)
Pls contact pt regarding results [phone]

## 2020-01-28 NOTE — Telephone Encounter (Signed)
Called pt - stated he usually does not answer his phone d/t scam calls; informed "His lab results and blood counts were normal." per Dr Sharon Seller. No questions.

## 2020-02-15 ENCOUNTER — Other Ambulatory Visit: Payer: Self-pay | Admitting: Internal Medicine

## 2020-02-15 DIAGNOSIS — F1190 Opioid use, unspecified, uncomplicated: Secondary | ICD-10-CM

## 2020-02-15 DIAGNOSIS — M4310 Spondylolisthesis, site unspecified: Secondary | ICD-10-CM

## 2020-02-16 NOTE — Telephone Encounter (Signed)
Refilled patients Percocet for 90 tablets. He is scheduled to see me again in March. Refill appropriate. [place] controlled substance database reviewed. Patient will need to continue to seek medical insurance with the goal of obtaining injections and PT for his back and knees. He will eventually require surgery most likely but given his uninsured status this continues to be difficult to attain.

## 2020-02-19 MED FILL — JANUMET 50-500 MG TABLET: 50-500 | 30 days supply | Qty: 30 | Fill #2

## 2020-02-24 ENCOUNTER — Encounter: Payer: Self-pay | Admitting: Internal Medicine

## 2020-02-26 ENCOUNTER — Ambulatory Visit (INDEPENDENT_AMBULATORY_CARE_PROVIDER_SITE_OTHER): Payer: Self-pay | Admitting: Internal Medicine

## 2020-02-26 ENCOUNTER — Other Ambulatory Visit: Payer: Self-pay

## 2020-02-26 ENCOUNTER — Encounter: Payer: Self-pay | Admitting: Internal Medicine

## 2020-02-26 VITALS — BP 134/80 | HR 104 | Temp 98.1°F | Ht 66.0 in | Wt 197.4 lb

## 2020-02-26 DIAGNOSIS — K921 Melena: Secondary | ICD-10-CM

## 2020-02-26 DIAGNOSIS — K529 Noninfective gastroenteritis and colitis, unspecified: Secondary | ICD-10-CM

## 2020-02-26 DIAGNOSIS — Z01818 Encounter for other preprocedural examination: Secondary | ICD-10-CM

## 2020-02-26 DIAGNOSIS — R1319 Other dysphagia: Secondary | ICD-10-CM

## 2020-02-26 DIAGNOSIS — R1310 Dysphagia, unspecified: Secondary | ICD-10-CM

## 2020-02-26 NOTE — Progress Notes (Signed)
[name] Acosta 60 y.o. [birth year] [id number]  Assessment & Plan:   Encounter Diagnoses  Name Primary?  . Black tarry stools Yes  . Chronic diarrhea   . Esophageal dysphagia   . Encounter for other preprocedural examination      Evaluate with EGD possible esophageal dilation and colonoscopy  The risks and benefits as well as alternatives of endoscopic procedure(s) have been discussed and reviewed. All questions answered. The patient agrees to proceed.   Subjective:   Chief Complaint: Black stools  HPI 60 year old white man who saw primary care a month or so ago was having what he thinks was melena and is a pretty accurate description which stopped after he quit ibuprofen.  Some mild abdominal cramping at times.  Chronic diarrhea.  Previous work-up with EGD and colonoscopy unrevealing in 2009.  Hemoglobin was normal after he was seen in primary care.  He has a lot of pain issues particularly in the spine, moves very slowly.  Used to take loperamide frequently but now that he uses some Percocet does not really need that so much.  Moves his bowels most days and on the Percocet not having as much diarrhea.  Very certain he has had melena and dark clots "you know what blood looks and smells like when you pass it".  He also describes intermittent solid food dysphagia.  He is on a PPI. Allergies  Allergen Reactions  . Codeine     REACTION: Unknown reaction  . Penicillins     Hives and Swelling. Has patient had a PCN reaction causing immediate rash, facial/tongue/throat swelling, SOB or lightheadedness with hypotension:YES Has patient had a PCN reaction causing severe rash involving mucus membranes or skin necrosis: NO Has patient had a PCN reaction that required hospitalization NO Has patient had a PCN reaction occurring within the last 10 years: No If all of the above answers are "NO", then may proceed with Cephalosporin use.   Current Meds  Medication Sig  . Cholecalciferol  (VITAMIN D-3) 1000 units CAPS Take 1 capsule by mouth daily.  . cyclobenzaprine (FEXMID) 7.5 MG tablet Take 1 tablet (7.5 mg total) by mouth 3 (three) times daily as needed for muscle spasms.  Marland Kitchen doxazosin (CARDURA) 8 MG tablet TAKE 1 TABLET BY MOUTH ONCE A DAY WITH BREAKFAST  . esomeprazole (NEXIUM) 40 MG capsule Take 40 mg by mouth once a week.  . gabapentin (NEURONTIN) 300 MG capsule Take 1 capsule (300 mg total) by mouth 3 (three) times daily.  Marland Kitchen glucose blood (ACCU-CHEK AVIVA) test strip Test blood glucose up to twice daily before meals. The patient is not insulin requiring, ICD 10 code E11.9.  . guaifenesin (HUMIBID E) 400 MG TABS tablet Take 400 mg by mouth 2 (two) times daily.  Marland Kitchen lisinopril (ZESTRIL) 20 MG tablet TAKE 1 TABLET BY MOUTH ONCE A DAY  . loperamide (IMODIUM) 2 MG capsule Take 2 mg by mouth as needed for diarrhea or loose stools.  Marland Kitchen loratadine (CLARITIN) 10 MG tablet Take 10 mg by mouth 2 (two) times daily.  Marland Kitchen oxyCODONE-acetaminophen (PERCOCET/ROXICET) 5-325 MG tablet TAKE 1 TABLET BY MOUTH EVERY 8 HOURS AS NEEDED FOR SEVERE PAIN  . pravastatin (PRAVACHOL) 20 MG tablet TAKE 1 TABLET BY MOUTH AT BEDTIME  . sitaGLIPtin-metformin (JANUMET) 50-500 MG tablet Take 1 tablet by mouth daily.  . SUMAtriptan (IMITREX) 6 MG/0.5ML SOLN injection INJECT 0.5 ML (6 MG TOTAL) INTO THE SKINEVERY 2 HOURS AS NEEDED FOR MIGRAINE OR HEADACHE. MAY REPEAT IN 2  HOURS IF HEADACHE PERSISTS OR RECU (Patient taking differently: as needed. )   Past Medical History:  Diagnosis Date  . Arthritis   . Chest pain 05/14/2016   Chest pain   . DM (diabetes mellitus) ([place])   . Hyperlipidemia   . Hypertension   . Prostate disorder   . Renal calculus   . Right hip pain 12/12/2015  . Sleep apnea    Past Surgical History:  Procedure Laterality Date  . APPENDECTOMY    . BRAIN SURGERY  1988   tumor  . FINGER SURGERY Left    middle  . INGUINAL HERNIA REPAIR Bilateral   . left ankle reconstruction  1999    self reported   . NASAL RECONSTRUCTION     x 3  . ROTATOR CUFF REPAIR Right    Social History   Social History Narrative   Art gallery manager, Dentist   Lives by himself   Smoker   No drugs or alcohol   family history includes Breast cancer in his maternal grandmother and mother; Diabetes in his maternal grandmother and mother; Heart disease in his maternal grandmother; Stroke in his sister.   Review of Systems As above.  Chronic pain issues aggravated when he fell off a ladder on the job site last year.  Otherwise negative  Objective:   Physical Exam BP 134/80 (BP Location: Left Arm, Patient Position: Standing, Cuff Size: Normal)   Pulse (!) 104   Temp 98.1 F (36.7 C)   Ht 5\' 6"  (1.676 m) Comment: height measured without shoes  Wt 197 lb 6 oz (89.5 kg)   BMI 31.86 kg/m  Chronically ill Anicteric Lungs cta Cor NL abd NT Moves slowly - painful gait, mvts

## 2020-02-26 NOTE — Patient Instructions (Signed)
You have been scheduled for an endoscopy and colonoscopy. Please follow the written instructions given to you at your visit today. Please pick up your prep supplies at the pharmacy within the next 1-3 days. If you use inhalers (even only as needed), please bring them with you on the day of your procedure.  I appreciate the opportunity to care for you. Carl Gessner, MD, FACG 

## 2020-03-08 NOTE — Progress Notes (Deleted)
   CC: HTN, chronic back/leg pain  HPI:[name] is a 60 y.o. male who presents for evaluation of HTN and chronic back/leg pain. Please see individual problem based A/P for details.  Hypertension: Patient's BP today is ***/*** with a goal of <140/80. The patient endorses adherence to *** medication regimen. *** denied, chest pain, headache, visual changes, lightheadedness, weakness, dizziness on standing, swelling in the feet or ankles.   Plan: Continue *** ***mg *** Continue *** ***mg daily Continue *** ***mg BID  DMII: Hgb A1c *** % at the last visit. Current A1c ***%. *** The patient denied polyuria, polydipsia, headache, fatigue, confusion, nausea, vomiting or diaphoresis.   Plan: Continue *** Continue *** Repeat A1c at next visit *** : ***  Plan: ***  ***: ***  Plan:  ***  ***: ***  Plan:  ***  Depression, PHQ-9: Based on the patients    Office Visit from 03/11/2019 in [place]  PHQ-9 Total Score  6     score we have ***.  Past Medical History:  Diagnosis Date  . Arthritis   . Chest pain 05/14/2016   Chest pain   . DM (diabetes mellitus) ([place])   . Hyperlipidemia   . Hypertension   . Prostate disorder   . Renal calculus   . Right hip pain 12/12/2015  . Sleep apnea    Review of Systems:  *** ROS negative except as per HPI.  Physical Exam: There were no vitals filed for this visit. There were no vitals filed for this visit. *** Assessment & Plan:   See Encounters Tab for problem based charting.  Patient {GC/[id number]::"discussed with","seen with"} Dr. Minna Merritts. Hoffman","Klima","Mullen","Narendra","Raines","Vincent"}

## 2020-03-09 ENCOUNTER — Encounter: Payer: Self-pay | Admitting: Internal Medicine

## 2020-03-16 ENCOUNTER — Encounter: Payer: Self-pay | Admitting: Internal Medicine

## 2020-03-16 ENCOUNTER — Ambulatory Visit (INDEPENDENT_AMBULATORY_CARE_PROVIDER_SITE_OTHER): Payer: Self-pay | Admitting: Internal Medicine

## 2020-03-16 VITALS — BP 128/69 | HR 82 | Temp 98.2°F | Ht 66.0 in | Wt 197.5 lb

## 2020-03-16 DIAGNOSIS — Z7984 Long term (current) use of oral hypoglycemic drugs: Secondary | ICD-10-CM

## 2020-03-16 DIAGNOSIS — Z8719 Personal history of other diseases of the digestive system: Secondary | ICD-10-CM

## 2020-03-16 DIAGNOSIS — F1190 Opioid use, unspecified, uncomplicated: Secondary | ICD-10-CM

## 2020-03-16 DIAGNOSIS — I10 Essential (primary) hypertension: Secondary | ICD-10-CM

## 2020-03-16 DIAGNOSIS — G8929 Other chronic pain: Secondary | ICD-10-CM

## 2020-03-16 DIAGNOSIS — M4310 Spondylolisthesis, site unspecified: Secondary | ICD-10-CM

## 2020-03-16 DIAGNOSIS — Z79891 Long term (current) use of opiate analgesic: Secondary | ICD-10-CM

## 2020-03-16 DIAGNOSIS — E119 Type 2 diabetes mellitus without complications: Secondary | ICD-10-CM

## 2020-03-16 DIAGNOSIS — Z79899 Other long term (current) drug therapy: Secondary | ICD-10-CM

## 2020-03-16 LAB — POCT GLYCOSYLATED HEMOGLOBIN (HGB A1C): Hemoglobin A1C: 5.5 % (ref 4.0–5.6)

## 2020-03-16 LAB — GLUCOSE, CAPILLARY: Glucose-Capillary: 108 mg/dL — ABNORMAL HIGH (ref 70–99)

## 2020-03-16 NOTE — Progress Notes (Signed)
   CC: HTN, DMII, chronic pain  HPI:[name] Acosta is a 60 y.o. male who presents for evaluation of HTN, DMII and chronic pain. Please see individual problem based A/P for details.  Past Medical History:  Diagnosis Date  . Accidental fall from ladder 12/10/2018  . Acute pain of right shoulder 11/26/2018  . Arthritis   . Bilateral lower extremity edema 06/05/2018  . Chest pain 05/14/2016   Chest pain   . DM (diabetes mellitus) ([place])   . Dyspnea on exertion 06/05/2018   Dyspnea on exertion  . Hyperlipidemia   . Hypertension   . Prostate disorder   . Renal calculus   . Right hip pain 12/12/2015  . Sleep apnea    Review of Systems:  ROS negative except as per HPI.  Physical Exam: Vitals:   03/16/20 1417  BP: 128/69  Pulse: 82  Temp: 98.2 F (36.8 C)  TempSrc: Oral  SpO2: 100%  Weight: 197 lb 8 oz (89.6 kg)  Height: 5\' 6"  (1.676 m)   Filed Weights   03/16/20 1417  Weight: 197 lb 8 oz (89.6 kg)   General: A/O x4, in no acute distress, afebrile, nondiaphoretic HEENT: PEERL, EMO intact Cardio: RRR, no mrg's  Pulmonary: CTA bilaterally, no wheezing or crackles  MSK: Pain on palpation of the lower spine is persistent, this is markedly decreased forward flexion of the spine limiting the exam. BLE tender to palpation of the knees as well. EDEMATOUS, pitting edema, intact distal dorsalis pedis pulses bilaterally, no obvious ulceration but this are calluses that are thick and pealing with thinning edges Psych: Appropriate affect, not depressed in appearance, engages well  Assessment & Plan:   See Encounters Tab for problem based charting.  Patient discussed with Dr. Dareen Piano

## 2020-03-16 NOTE — Patient Instructions (Signed)
FOLLOW-UP INSTRUCTIONS When: 2-3 months For: Routine appointment What to bring: All of your medications  I have not made any changes to your medications today.   Please continue your current medications. I will notify you of any necessary changes to therapy based on your lab results. Please do continue to obtain insurance as we are to a place where this is limiting what we can to for you now.   Thank you for your visit to the [name] today. If you have any questions or concerns please call us at [phone].

## 2020-03-17 ENCOUNTER — Encounter: Payer: Self-pay | Admitting: Internal Medicine

## 2020-03-17 DIAGNOSIS — Z8719 Personal history of other diseases of the digestive system: Secondary | ICD-10-CM | POA: Insufficient documentation

## 2020-03-17 LAB — BMP8+ANION GAP
Anion Gap: 12 mmol/L (ref 10.0–18.0)
BUN/Creatinine Ratio: 17 (ref 9–20)
BUN: 16 mg/dL (ref 6–24)
CO2: 23 mmol/L (ref 20–29)
Calcium: 9.1 mg/dL (ref 8.7–10.2)
Chloride: 107 mmol/L — ABNORMAL HIGH (ref 96–106)
Creatinine, Ser: 0.96 mg/dL (ref 0.76–1.27)
GFR calc Af Amer: 100 mL/min/{1.73_m2} (ref >59)
GFR calc non Af Amer: 86 mL/min/{1.73_m2} (ref >59)
Glucose: 99 mg/dL (ref 65–99)
Potassium: 4.6 mmol/L (ref 3.5–5.2)
Sodium: 142 mmol/L (ref 134–144)

## 2020-03-17 LAB — CBC
Hematocrit: 46.9 % (ref 37.5–51.0)
Hemoglobin: 15.9 g/dL (ref 13.0–17.7)
MCH: 33.1 pg — ABNORMAL HIGH (ref 26.6–33.0)
MCHC: 33.9 g/dL (ref 31.5–35.7)
MCV: 98 fL — ABNORMAL HIGH (ref 79–97)
Platelets: 178 10*3/uL (ref 150–450)
RBC: 4.81 x10E6/uL (ref 4.14–5.80)
RDW: 12 % (ref 11.6–15.4)
WBC: 8.2 10*3/uL (ref 3.4–10.8)

## 2020-03-17 MED ORDER — OXYCODONE-ACETAMINOPHEN 5-325 MG PO TABS: ORAL_TABLET | ORAL | 0 refills | Status: DC

## 2020-03-17 MED ORDER — OXYCODONE-ACETAMINOPHEN 5-325 MG PO TABS: 1.0000 | ORAL_TABLET | Freq: Three times a day (TID) | ORAL | 0 refills | Status: DC | PRN

## 2020-03-17 NOTE — Assessment & Plan Note (Signed)
Hypertension: Patient's BP today is 128/69 with a goal of <140/80. The patient endorses adherence to his medication regimen. He denied, chest pain, headache, visual changes, lightheadedness, weakness, dizziness on standing, swelling in the feet or ankles.   Plan: Continue Lisinopril 10mg  daily BMP today with stable sCr at 0.96 and potassium of 4.6.

## 2020-03-17 NOTE — Assessment & Plan Note (Signed)
Hgb stable at 15.9. Denied further melena or fatigue. Continue to recommend refraining from NSAID and limited glucocorticoid use.

## 2020-03-17 NOTE — Assessment & Plan Note (Signed)
DMII: Controlled. Hgb A1c 5.4 % at the last visit. Current A1c 5.5%. The patient denied polyuria, polydipsia, headache, fatigue, confusion, nausea, vomiting or diaphoresis. As he is not on insulin and denied hypoglycemia I feel it is safe to continue JANUMET. Diabetic foot exam today somewhat concerning given the degree of numbness and edema. I have advised him to make certain he uses a more accommodating shoe/boot in the future to reduce the risk of diabetic foot ulcers.   Plan: Continue Janumet 50-500 daily Repeat A1c at next visit

## 2020-03-17 NOTE — Assessment & Plan Note (Addendum)
Continued severe pain. Still unable to obtain insurance for many reasons given. Reduced forward flexion and ambulation. UDS in November as expected. PDMP reviewed. As he continues to work on obtaining disability and insurance we will continue pain control with Percocet q8 hours PRN.    Refilled Percocet 5/325 for three months

## 2020-03-17 NOTE — Assessment & Plan Note (Addendum)
See Anterolisthesis A/P from same date.  Recommend repeat UDS in ~6 months

## 2020-03-18 NOTE — Progress Notes (Signed)
Internal Medicine Clinic Attending  Case discussed with Dr. Harbrecht at the time of the visit.  We reviewed the resident's history and exam and pertinent patient test results.  I agree with the assessment, diagnosis, and plan of care documented in the resident's note.   

## 2020-03-30 ENCOUNTER — Telehealth: Payer: Self-pay | Admitting: Internal Medicine

## 2020-03-30 NOTE — Telephone Encounter (Signed)
Pt is calling regarding results [phone]

## 2020-03-31 NOTE — Telephone Encounter (Signed)
Patient updated on lab results from the 18th per his request. I advised him that we did not need to make any medication changes. His A1c is low at 5.5% and he remains on Janumet. I would discuss stopping the DPP4 and continuing Metformin only if he remains this well controlled at his next visit.

## 2020-04-12 ENCOUNTER — Telehealth: Payer: Self-pay

## 2020-04-12 NOTE — Telephone Encounter (Signed)
Pt is scheduled for a Endocolon with Dr. Carlean Purl on 4/14. Pt no showed covid test. I was unable to get in contact with patient, and pt has voicemail box not set up.

## 2020-04-13 ENCOUNTER — Encounter: Payer: Self-pay | Admitting: Internal Medicine

## 2020-04-26 MED FILL — JANUMET 50-500 MG TABLET: 50-500 | 30 days supply | Qty: 30 | Fill #3

## 2020-05-02 ENCOUNTER — Encounter: Payer: Self-pay | Admitting: *Deleted

## 2020-05-19 MED FILL — JANUMET 50-500 MG TABLET: 50-500 | 30 days supply | Qty: 30 | Fill #4

## 2020-05-31 NOTE — Telephone Encounter (Signed)
We will try to call patient about this missed appointment and reschedule  He has appointment coming up with PCP and if we are unable to reach him about this and reschedule will ask PCP to f/u if patient makes it to [place]

## 2020-05-31 NOTE — Telephone Encounter (Signed)
I spoke with [name] Acosta and he said he no showed because he didn't have the finances to come for his appointments. He said his finances are worst now than in April so he cannot reschedule at this time. I am sending him a financial assistance application for [facility] as he has no insurance. I told him when he gets this set up call us and we can get him back on the schedule. I confirmed his address before hanging up.

## 2020-06-08 ENCOUNTER — Encounter: Payer: Self-pay | Admitting: Internal Medicine

## 2020-06-15 ENCOUNTER — Ambulatory Visit (INDEPENDENT_AMBULATORY_CARE_PROVIDER_SITE_OTHER): Payer: Self-pay | Admitting: Internal Medicine

## 2020-06-15 ENCOUNTER — Encounter: Payer: Self-pay | Admitting: Internal Medicine

## 2020-06-15 VITALS — BP 120/72 | HR 87 | Temp 98.2°F | Ht 66.0 in | Wt 190.3 lb

## 2020-06-15 DIAGNOSIS — M4310 Spondylolisthesis, site unspecified: Secondary | ICD-10-CM

## 2020-06-15 DIAGNOSIS — E119 Type 2 diabetes mellitus without complications: Secondary | ICD-10-CM

## 2020-06-15 DIAGNOSIS — F1190 Opioid use, unspecified, uncomplicated: Secondary | ICD-10-CM

## 2020-06-15 DIAGNOSIS — I10 Essential (primary) hypertension: Secondary | ICD-10-CM

## 2020-06-15 LAB — POCT GLYCOSYLATED HEMOGLOBIN (HGB A1C): Hemoglobin A1C: 5.5 % (ref 4.0–5.6)

## 2020-06-15 LAB — GLUCOSE, CAPILLARY: Glucose-Capillary: 98 mg/dL (ref 70–99)

## 2020-06-15 MED ORDER — OXYCODONE-ACETAMINOPHEN 5-325 MG PO TABS: 1.0000 | ORAL_TABLET | Freq: Three times a day (TID) | ORAL | 0 refills | Status: DC | PRN

## 2020-06-15 MED ORDER — OXYCODONE-ACETAMINOPHEN 5-325 MG PO TABS: ORAL_TABLET | ORAL | 0 refills | Status: DC

## 2020-06-15 NOTE — Assessment & Plan Note (Addendum)
  Chronic opioid use: [name] Acosta is on chronic opioid therapy for chronic pain. The date of the controlled substances contract is referenced in the [place] and / or the overview. Date of pain contract was 06/15/20. As part of the treatment plan, the [place] controlled substance database is checked at least twice yearly and the database results are appropriate. I have last reviewed the results on 06/15/20.   Of note, there was a prior pain contract violation in 2009 due to multiple negative drug screens. His most recent here was as expected.   The last UDS was on 11/11/2019 and results are as expected. Patient needs at least a yearly UDS.   The patient is on oxycodone/acetaminophen (Percocet, Tylox) strength 5/325, 90 per 30 days. Adjunctive treatment includes NSAIDS, Cymbalta, Elavil and Sports Medbut are either limited due to side effects or of no benefit. This regimen allows [name] Acosta to function and does not cause excessive sedation or other side effects.  "The benefits of continuing opioid therapy outweigh the risks and chronic opioids will be continued. Ongoing education about safe opioid treatment is provided  Interventions today include: Continue prescribing Percocet Continue to pursue insurance to facilitate surgical options and or PT/OT

## 2020-06-15 NOTE — Assessment & Plan Note (Signed)
DMII: Hgb A1c 5.5 % at the last visit. Current A1c 5.5%. The patient denied polyuria, polydipsia, headache, fatigue, confusion, nausea, vomiting or diaphoresis.   Plan: Continue Janumet 50-500mg  daily Consider Metformin only with close follow-up if his A1c remains WNL's Repeat A1c at next visit

## 2020-06-15 NOTE — Assessment & Plan Note (Signed)
Hypertension: Patient's BP today is 120/72 with a goal of <140/80. The patient endorses adherence to his medication regimen. He denied, chest pain, headache, visual changes, lightheadedness, weakness, dizziness on standing, swelling in the feet or ankles.   Plan: Continue lisinopril 10mg  daily

## 2020-06-15 NOTE — Patient Instructions (Signed)
FOLLOW-UP INSTRUCTIONS When: 2.5-3 months For: Routine visit What to bring: All of your medications  I have not made any changes to your medications today.   Today we discussed your diabetes, high blood pressure, back pain and need to treatment. Please continue your current therapy.  Thank you for your visit to the [facility] today. If you have any questions or concerns please call us at [phone].

## 2020-06-15 NOTE — Progress Notes (Signed)
   CC: HTN, DMII and back pain  HPI:Mr.DADE RODIN is a 60 y.o. male who presents for evaluation of DMII, HTN and anterolisthesis. Please see individual problem based A/P for details.  Depression, PHQ-9: Based on the patients    Office Visit from 03/16/2020 in [place]  PHQ-9 Total Score 1     score we have decided to monitor.  Past Medical History:  Diagnosis Date  . Accidental fall from ladder 12/10/2018  . Acute pain of right shoulder 11/26/2018  . Arthritis   . Bilateral lower extremity edema 06/05/2018  . Chest pain 05/14/2016   Chest pain   . DM (diabetes mellitus) ([place])   . Dyspnea on exertion 06/05/2018   Dyspnea on exertion  . Hyperlipidemia   . Hypertension   . Prostate disorder   . Renal calculus   . Right hip pain 12/12/2015  . Sleep apnea    Review of Systems:  ROS negative except as per HPI.  Physical Exam: Vitals:   06/15/20 1333  BP: 120/72  Pulse: 87  Temp: 98.2 F (36.8 C)  TempSrc: Oral  SpO2: 97%  Weight: 190 lb 4.8 oz (86.3 kg)  Height: 5\' 6"  (1.676 m)   Filed Weights   06/15/20 1333  Weight: 190 lb 4.8 oz (86.3 kg)   General: A/O x4, in no acute distress, afebrile, nondiaphoretic HEENT: PEERL, EMO intact Cardio: RRR, no mrg's  Pulmonary: CTA bilaterally, no wheezing or crackles  Abdomen: Bowel sounds normal, soft, nontender  MSK: BLE nontender, nonedematous Diabetic foot exam: No rashes, ulceration or skin breakdown noted. Sensation intact to fine touch and vibration.  Neuro: CNII-XII grossly intact, conversational, strength 5/5 in the upper and lower extremities bilaterally, normal gait Psych: Appropriate affect, not depressed in appearance, engages well  Assessment & Plan:   See Encounters Tab for problem based charting.  Patient discussed with Dr. Evette Doffing

## 2020-06-15 NOTE — Assessment & Plan Note (Signed)
Anterolisthesis: Patient has marked reduction in ability to perform IADLS and ADLS without at least moderate pain reduction which is provided by the percocet 5/325. Most recent toxassure was as expected in November 2020. Repeat annually per conctract. Patient continues to have issues obtaining insurance to provide a more reasonable alternative treatment. He is limited by income and his pain from finding alternative options. I have advised him to apply for disability and medicare as I do not see a clear alternative and do not wish for him to be on percocet for life.   Plan: Continue to follow with Neurosurgery as able Consider Duloxetine at next visit

## 2020-06-16 NOTE — Progress Notes (Signed)
Internal Medicine Clinic Attending  Case discussed with Dr. Harbrecht at the time of the visit.  We reviewed the resident's history and exam and pertinent patient test results.  I agree with the assessment, diagnosis, and plan of care documented in the resident's note.   

## 2020-06-21 MED FILL — JANUMET 50-500 MG TABLET: 50-500 | 30 days supply | Qty: 30 | Fill #5

## 2020-07-06 ENCOUNTER — Other Ambulatory Visit: Payer: Self-pay | Admitting: Internal Medicine

## 2020-07-06 DIAGNOSIS — I10 Essential (primary) hypertension: Secondary | ICD-10-CM

## 2020-07-06 DIAGNOSIS — N400 Enlarged prostate without lower urinary tract symptoms: Secondary | ICD-10-CM

## 2020-07-21 ENCOUNTER — Other Ambulatory Visit: Payer: Self-pay | Admitting: Internal Medicine

## 2020-07-21 DIAGNOSIS — E119 Type 2 diabetes mellitus without complications: Secondary | ICD-10-CM

## 2020-07-21 MED FILL — JANUMET 50-500 MG TABLET: 50-500 | 30 days supply | Qty: 30 | Fill #0

## 2020-07-21 NOTE — Telephone Encounter (Signed)
Need refill on sitaGLIPtin-metformin (JANUMET) 50-500 MG tablet ;pt contact [facility], [address]

## 2020-08-30 MED FILL — JANUMET 50-500 MG TABLET: 50-500 | 30 days supply | Qty: 30 | Fill #1

## 2020-09-07 ENCOUNTER — Encounter: Payer: Self-pay | Admitting: Student

## 2020-09-07 ENCOUNTER — Other Ambulatory Visit: Payer: Self-pay

## 2020-09-07 ENCOUNTER — Ambulatory Visit (INDEPENDENT_AMBULATORY_CARE_PROVIDER_SITE_OTHER): Payer: Self-pay | Admitting: Student

## 2020-09-07 VITALS — BP 109/65 | HR 100 | Temp 98.4°F

## 2020-09-07 DIAGNOSIS — M545 Low back pain: Secondary | ICD-10-CM

## 2020-09-07 DIAGNOSIS — R631 Polydipsia: Secondary | ICD-10-CM

## 2020-09-07 DIAGNOSIS — G8929 Other chronic pain: Secondary | ICD-10-CM

## 2020-09-07 DIAGNOSIS — M4310 Spondylolisthesis, site unspecified: Secondary | ICD-10-CM

## 2020-09-07 DIAGNOSIS — R21 Rash and other nonspecific skin eruption: Secondary | ICD-10-CM

## 2020-09-07 DIAGNOSIS — F1120 Opioid dependence, uncomplicated: Secondary | ICD-10-CM

## 2020-09-07 DIAGNOSIS — Z5989 Other problems related to housing and economic circumstances: Secondary | ICD-10-CM

## 2020-09-07 DIAGNOSIS — Z0000 Encounter for general adult medical examination without abnormal findings: Secondary | ICD-10-CM

## 2020-09-07 DIAGNOSIS — Z23 Encounter for immunization: Secondary | ICD-10-CM

## 2020-09-07 DIAGNOSIS — Z13220 Encounter for screening for lipoid disorders: Secondary | ICD-10-CM

## 2020-09-07 DIAGNOSIS — M4313 Spondylolisthesis, cervicothoracic region: Secondary | ICD-10-CM

## 2020-09-07 DIAGNOSIS — E119 Type 2 diabetes mellitus without complications: Secondary | ICD-10-CM

## 2020-09-07 DIAGNOSIS — F1190 Opioid use, unspecified, uncomplicated: Secondary | ICD-10-CM

## 2020-09-07 DIAGNOSIS — E785 Hyperlipidemia, unspecified: Secondary | ICD-10-CM

## 2020-09-07 DIAGNOSIS — Z0001 Encounter for general adult medical examination with abnormal findings: Secondary | ICD-10-CM

## 2020-09-07 DIAGNOSIS — I10 Essential (primary) hypertension: Secondary | ICD-10-CM

## 2020-09-07 HISTORY — DX: Polydipsia: R63.1

## 2020-09-07 LAB — GLUCOSE, CAPILLARY: Glucose-Capillary: 119 mg/dL — ABNORMAL HIGH (ref 70–99)

## 2020-09-07 LAB — POCT GLYCOSYLATED HEMOGLOBIN (HGB A1C): Hemoglobin A1C: 5.7 % — AB (ref 4.0–5.6)

## 2020-09-07 MED ORDER — LISINOPRIL 10 MG PO TABS: 10.0000 mg | ORAL_TABLET | Freq: Every day | ORAL | 0 refills | Status: DC

## 2020-09-07 MED ORDER — DULOXETINE HCL 30 MG PO CPEP: 30.0000 mg | ORAL_CAPSULE | Freq: Every day | ORAL | 0 refills | Status: DC

## 2020-09-07 NOTE — Assessment & Plan Note (Signed)
BP today is 92/65, 109/65 on repeat. The patient reports he has been taking his lisinopril as prescribed. Last note instructed patient to take lisinopril 10 mg daily however lisinopril is ordered as 20 mg tablets. Suspect patient may have been taking 20 mg daily. He denies lightheadedness, weakness, dizziness on standing.  Reports he has been drinking copious fluids.  Plan: -Represcribed lisinopril 10 mg daily

## 2020-09-07 NOTE — Progress Notes (Addendum)
   CC: sores on hand, increased thirst, back pain  HPI:  [name] is a 60 y.o. 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 His last clinic visit was on 06/15/20 with his then PCP Dr. Berline Lopes.   To see the details of this patient's management of their acute and chronic problems, please refer to the A&P under the Encounters tab.    Past Medical History:  Diagnosis Date  . Accidental fall from ladder 12/10/2018  . Acute pain of right shoulder 11/26/2018  . Arthritis   . Bilateral lower extremity edema 06/05/2018  . Chest pain 05/14/2016   Chest pain   . Chronic pain of left ankle 08/01/2011   S/p reconstruction surgery in 1999.   . Dark stools 01/12/2020  . DM (diabetes mellitus) ([place])   . Dyspnea on exertion 06/05/2018   Dyspnea on exertion  . Hematuria 08/01/2011   Check UA with next visit. Urine cytology in 2009 and 2012- negative. 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 0.3 mm  renal calculus in lower pole of right kidney right kidney seen in renal ultrasound in 2009. History of renal calculus,s/p lithotripsy in 2002 Cystoscopy in 2006 was negative.    . Hyperlipidemia   . Hypertension   . Prostate disorder   . Renal calculus   . Right hip pain 12/12/2015  . Sleep apnea    Review of Systems:    Review of Systems  Constitutional: Negative for malaise/fatigue and weight loss.  Respiratory: Negative for cough and shortness of breath.   Cardiovascular: Negative for chest pain.  Genitourinary: Negative for dysuria and hematuria.  Musculoskeletal: Positive for back pain.  Skin: Positive for rash.  Neurological: Negative for dizziness, weakness and headaches.  Endo/Heme/Allergies: Positive for polydipsia.   Physical Exam:  Vitals:   09/07/20 0852 09/07/20 0856  BP: 92/65 109/65  Pulse: (!) 102 100  Temp: 98.4 F (36.9 C)   TempSrc: Oral     SpO2: 98%    Constitutional: appears older than stated age, sitting in transport chair, in no acute distress HENT: lips dry, mucous membranes moist Cardiovascular: regular rate and rhythm, normal heart sounds, no m/r/g, no lower extremity dedma Pulmonary/Chest: normal work of breathing on room air, CTAB MSK: Pain on palpation of the lower spine is persistent, this is markedly decreased forward flexion of the spine limiting the exam Skin: Scattered 1 cm^2 superficial erosions in various stages of healing on dorsal aspect of bilateral hands; no vesicles. On bilateral forearms: healing scattered scabs and scattered areas of slight hyperpigmentation.        Assessment & Plan:   See Encounters Tab for problem based charting.  Patient seen with Dr. Dareen Piano

## 2020-09-07 NOTE — Assessment & Plan Note (Signed)
The patient continues on chronic opioid therapy for chronic low back pain.  Date of pain contract was 06/15/2020.  The last UDS was on 11/11/2019.  Will repeat UDS today.  He is on oxycodone/acetaminophen (Percocet) strength 5-325, 90 per 30 days.  Prescribed duloxetine 30 mg daily today as an adjunctive treatment.  Patient also takes cyclobenzaprine as needed.  Plan: -Continue Percocet 5-325 -UDS today -CCM consulted to continue to pursue insurance to facilitate surgical options and/or PT/OT

## 2020-09-07 NOTE — Assessment & Plan Note (Signed)
Patient reports persistent lower back pain, unchanged from prior.  Notes that sometimes the pain is manageable, however sometimes it is unbearable.  He takes Percocet 5/325.  Also takes cyclobenzaprine as needed, about 3 times weekly if he is having trouble sleeping.  Denies recent fever, new weakness, changes in bowel or bladder function.  He reports he looked into applying for disability, however was told he "does not have enough credits."  Spoke with our financial counselor [name] who reports she has reached out multiple times to the patient, and he did not want to share information required for the application.  Plan: -Start duloxetine 30 mg daily -Provided the patient with a handout of information required to apply for disability/insurance from our financial counselor [place] consult

## 2020-09-07 NOTE — Assessment & Plan Note (Signed)
Patient is taking pravastatin 20 mg daily.  Will repeat lipid panel today.

## 2020-09-07 NOTE — Assessment & Plan Note (Signed)
Hemoglobin A1c 5.7% today, 5.5% at the last visit.  Patient reports he has been taking Janumet 50-500 mg daily as prescribed.  Reports he did not tolerate Metformin alone previously secondary to GI side effects and "icky feeling."  He endorses increased thirst, polydipsia, polyuria (see today's full note for more information).  Plan: -Continue Janumet 50-500 mg daily -Repeat A1c in 3 months

## 2020-09-07 NOTE — Assessment & Plan Note (Addendum)
Patient reports increased thirst over the last couple of months.  This is led to a significant increase in his daily fluid consumption.  He reports on average, he drinks 6-8 16-oz bottles of water, 64 ounces of Kool-aid, and 3-5 16-oz bottles of Diet Pepsi daily for the last couple months. Reports his thirst is rarely quenched despite this intake. Reports above average urine output of clear urine.   States he spends time outside everyday, however the amount of time has not increased.   Also reports frequent leg cramps over the same time period. Started taking OTC potassium 99 mg BID for the last few days with some improvement. Yesterday took 1 teaspoon of salt for cramps with good effect.  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 He reports he was instructed to take DDAVP at the time however could not afford it. Reports he has not had polydipsia to this extent before.  Assessment/Plan:  Patient's A1c is 5.7 today and has been 5-6 for the last >1 year. 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 Cannot rule out primary polydipsia.   - BMP and urinalysis for plasma sodium concentration and urine osmolality. If low, would consider water restriction - BMP also for K+ and Na+

## 2020-09-07 NOTE — Patient Instructions (Signed)
Mr. Goin,   Thank you for your visit to the [facility] today. It was a pleasure seeing you. Today we discussed the following:  1) Back pain - We are starting you on a medicine called duloxetine (30 mg daily) to help with your pain - We are doing a urine drug screen since you are taking Percocet chronically - We have made a referral to our Chronic Care Management team to help you with applying for disability/insurance. We also provided you a list of information to gather for this process.   2) Increased thirst - The first step in evaluating these symptoms is to evaluate your urine, which we are obtaining a sample of today - We are also getting a lab test to evaluate your electrolytes - I will call you if these tests are abnormal  3) Skin changes on hands - Please monitor the sores on your hands. Call us if they worsen. We would like to be able to examine them when they are still blisters, so call to make an appointment if the blisters reappear.  4) Blood pressure - your blood pressure was lower today, 109/65. I have re-prescribed your lisinopril. Please make sure you are taking lisinopril 10 mg daily.  5) Vaccines - you received your flu shot today - we discussed the COVID-19 vaccine. If you decide you are interested in getting vaccinated against COVID-19, you can receive a vaccine at any major pharmacy. [facility] also has a COVID-19 hotline you can call for testing and vaccination information: [phone]   We would like to see you back in 2-3 weeks or sooner depending on the results of these tests.   If you have any questions or concerns, please call our clinic at [phone] between 9am-5pm. Outside of these hours, call [phone] and ask for the internal medicine resident on call. If you feel you are having a medical emergency please call 911.

## 2020-09-07 NOTE — Assessment & Plan Note (Signed)
Patient reports development of "sores" on the dorsal aspect of his bilateral hands over the last couple of months. Reports the sores start as "water blisters" however burst spontaneously or he will pop them himself. Endorses mild pain, however denies pruritis. Works intermittently as an Architect person. Denies use of new lotions, increased hand washing, increased sun exposure. Has not experienced this previously.   Has healing scattered scabs on his bilateral forearms which he reports start as darkly colored areas and will "come to the surface" and sometimes open if he rubs his arms against something. Notes these skin changes on his arms have been happening for ~2 years.  Physical exam significant for: Scattered 1 cm^2 superficial erosions in various stages of healing on dorsal aspect of bilateral hands; no vesicles. On bilateral forearms: healing scattered scabs and scattered areas of slight hyperpigmentation.  A/P: Unsure what to make of these lesions currently. Contact dermatitis on the differential, however source is unclear.  - Advised patient to give [place] a call when the lesions are vesicles so that we can evaluate them then.

## 2020-09-07 NOTE — Assessment & Plan Note (Signed)
-  Flu vaccine today -Counseled patient on COVID-19 vaccination.  He expresses skepticism regarding the efficacy of the vaccine.  Provided him with the number for the COVID-19 hotline and instructed him that he could get the vaccine at any major pharmacy as well.

## 2020-09-08 ENCOUNTER — Telehealth: Payer: Self-pay | Admitting: *Deleted

## 2020-09-08 LAB — URINALYSIS, COMPLETE
Bilirubin, UA: NEGATIVE
Glucose, UA: NEGATIVE
Ketones, UA: NEGATIVE
Nitrite, UA: NEGATIVE
Specific Gravity, UA: 1.022 (ref 1.005–1.030)
Urobilinogen, Ur: 0.2 mg/dL (ref 0.2–1.0)
pH, UA: 5 (ref 5.0–7.5)

## 2020-09-08 LAB — BMP8+ANION GAP
Anion Gap: 15 mmol/L (ref 10.0–18.0)
BUN/Creatinine Ratio: 20 (ref 9–20)
BUN: 17 mg/dL (ref 6–24)
CO2: 22 mmol/L (ref 20–29)
Calcium: 9.3 mg/dL (ref 8.7–10.2)
Chloride: 106 mmol/L (ref 96–106)
Creatinine, Ser: 0.85 mg/dL (ref 0.76–1.27)
GFR calc Af Amer: 110 mL/min/{1.73_m2} (ref >59)
GFR calc non Af Amer: 95 mL/min/{1.73_m2} (ref >59)
Glucose: 102 mg/dL — ABNORMAL HIGH (ref 65–99)
Potassium: 4.5 mmol/L (ref 3.5–5.2)
Sodium: 143 mmol/L (ref 134–144)

## 2020-09-08 LAB — LIPID PANEL
Chol/HDL Ratio: 3.8 ratio (ref 0.0–5.0)
Cholesterol, Total: 141 mg/dL (ref 100–199)
HDL: 37 mg/dL — ABNORMAL LOW (ref >39)
LDL Chol Calc (NIH): 85 mg/dL (ref 0–99)
Triglycerides: 102 mg/dL (ref 0–149)
VLDL Cholesterol Cal: 19 mg/dL (ref 5–40)

## 2020-09-08 LAB — MICROSCOPIC EXAMINATION
Bacteria, UA: NONE SEEN
Casts: NONE SEEN /lpf
Epithelial Cells (non renal): NONE SEEN /hpf (ref 0–10)

## 2020-09-08 NOTE — Progress Notes (Signed)
Internal Medicine Clinic Attending  I saw and evaluated the patient.  I personally confirmed the key portions of the history and exam documented by Dr. Watson and I reviewed pertinent patient test results.  The assessment, diagnosis, and plan were formulated together and I agree with the documentation in the resident's note.  

## 2020-09-08 NOTE — Chronic Care Management (AMB) (Signed)
  Care Management   Note  09/08/2020 Name: [name] MRN: [id number] DOB: 04-Aug-1960  [name] Acosta is a 60 y.o. year old male who is a primary care patient of Alexandria Lodge, MD. I reached out to [name] by phone today in response to a referral sent by Mr. Layne Dilauro Mandell's PCP, Alexandria Lodge, MD.    Mr. Littlejohn was given information about care management services today including:  1. Care management services include personalized support from designated clinical staff supervised by his physician, including individualized plan of care and coordination with other care providers 2. 24/7 contact phone numbers for assistance for urgent and routine care needs. 3. The patient may stop care management services at any time by phone call to the office staff.  Patient agreed to services and verbal consent obtained.   Follow up plan: Telephone appointment with care management team member scheduled for: 09/12/2020   [place] Management

## 2020-09-10 LAB — TOXASSURE SELECT,+ANTIDEPR,UR

## 2020-09-12 ENCOUNTER — Telehealth: Payer: Self-pay

## 2020-09-12 NOTE — Telephone Encounter (Signed)
°  Chronic Care Management   Outreach Note  09/12/2020 Name: [name] Acosta MRN: [id number] DOB: [birth year]  Referred by: Alexandria Lodge, MD Reason for referral : Care Coordination (lack of insurance)  Unsuccessful attempt to contact patient today for scheduled phone assessment.  Unable to leave message on mobile number due to mailbox not being set up yet.  No answer or option to leave message on home number.    Follow Up Plan: Patient scheduled for clinic appointment on 09/14/20.  Will collaborate with RNCM, Kelli Churn, to assist with rescheduling of initial SW assessment.      Ronn Melena, [place] Coordination Social Worker [place] [phone]

## 2020-09-14 ENCOUNTER — Ambulatory Visit: Payer: Self-pay

## 2020-09-20 ENCOUNTER — Other Ambulatory Visit: Payer: Self-pay

## 2020-09-20 DIAGNOSIS — M4310 Spondylolisthesis, site unspecified: Secondary | ICD-10-CM

## 2020-09-20 DIAGNOSIS — F1190 Opioid use, unspecified, uncomplicated: Secondary | ICD-10-CM

## 2020-09-20 MED ORDER — OXYCODONE-ACETAMINOPHEN 5-325 MG PO TABS: 1.0000 | ORAL_TABLET | Freq: Three times a day (TID) | ORAL | 0 refills | Status: DC | PRN

## 2020-09-20 NOTE — Telephone Encounter (Signed)
Called pt - stated he needs a refill on his pain med, Oxycodone. Also stated he stopped taking the new med for nerve pain (duloxetine) b/c it caused dry mouth, diarrhea,and massive confusion after the second dose.  Last rx written 05/31/20 x 3 Last OV 09/07/20 Next OV 09/28/20 UDS 09/07/20

## 2020-09-20 NOTE — Telephone Encounter (Signed)
Requesting to speak with a nurse about pain  meds, please call pt back.  

## 2020-09-27 MED FILL — JANUMET 50-500 MG TABLET: 50-500 | 30 days supply | Qty: 30 | Fill #2

## 2020-09-28 ENCOUNTER — Encounter: Payer: Self-pay | Admitting: Student

## 2020-09-28 ENCOUNTER — Ambulatory Visit: Payer: Self-pay

## 2020-09-28 NOTE — Progress Notes (Deleted)
   CC: ***  HPI:  Mr.Arnet H Fonda is a 60 y.o. person with history of *** who presents to clinic for ***. Their last clinic visit was on ***.   To see the details of this patient's management of their acute and chronic problems, please refer to the Assessment & Plan under the Encounters tab.    Past Medical History:  Diagnosis Date  . Accidental fall from ladder 12/10/2018  . Acute pain of right shoulder 11/26/2018  . Arthritis   . Bilateral lower extremity edema 06/05/2018  . Chest pain 05/14/2016   Chest pain   . Chronic pain of left ankle 08/01/2011   S/p reconstruction surgery in 1999.   . Dark stools 01/12/2020  . DM (diabetes mellitus) ([place])   . Dyslipidemia 10/18/2006      . Dyspnea on exertion 06/05/2018   Dyspnea on exertion  . Hematuria 08/01/2011   Check UA with next visit. Urine cytology in 2009 and 2012- negative. 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 0.3 mm  renal calculus in lower pole of right kidney right kidney seen in renal ultrasound in 2009. History of renal calculus,s/p lithotripsy in 2002 Cystoscopy in 2006 was negative.    . Hyperlipidemia   . Hypertension   . Prostate disorder   . Renal calculus   . Right hip pain 12/12/2015  . Sleep apnea    Review of Systems:    ROS  Physical Exam:  There were no vitals filed for this visit. Constitutional: well-appearing *** sitting in chair, in no acute distress HENT: normocephalic atraumatic, mucous membranes moist Eyes: conjunctiva non-erythematous Neck: supple Cardiovascular: regular rate and rhythm, no m/r/g Pulmonary/Chest: normal work of breathing on room air, lungs clear to auscultation bilaterally Abdominal: soft, non-tender, non-distended MSK: normal bulk and tone Neurological: alert & oriented x 3, 5/5 strength in bilateral upper and lower extremities, normal gait Skin: *** Psych: ***    Assessment & Plan:   See Encounters Tab for problem based charting.  Patient {GC/GE:3044014::"discussed  with","seen with"} Dr. {NAMES:[id number]::"Butcher","Guilloud","Hoffman","Mullen","Narendra","Raines","Vincent"}

## 2020-09-29 ENCOUNTER — Ambulatory Visit: Payer: Self-pay

## 2020-09-29 ENCOUNTER — Encounter: Payer: Self-pay | Admitting: Student

## 2020-10-05 ENCOUNTER — Telehealth: Payer: Self-pay

## 2020-10-05 NOTE — Telephone Encounter (Signed)
  Chronic Care Management   Outreach Note  10/05/2020 Name: [name] Acosta MRN: [id number] DOB: May 22, 1960  Referred by: Alexandria Lodge, MD Reason for referral : Care Coordination (uninsured)   Second unsuccessful telephone outreach at scheduled appointment time. The patient was referred to the case management team for assistance with care management and care coordination. No answer or option to leave message.    Follow Up Plan: Will request that scheduling Care Guide, Laverda Sorenson, contact patient for purpose of rescheduling initial SW assessment.      Ronn Melena, [place] Coordination Social Worker [place] [phone]

## 2020-10-11 ENCOUNTER — Ambulatory Visit: Payer: Self-pay

## 2020-10-11 ENCOUNTER — Telehealth: Payer: Self-pay

## 2020-10-11 NOTE — Chronic Care Management (AMB) (Signed)
  Chronic Care Management   Outreach Note  10/11/2020 Name: [name] Acosta MRN: [id number] DOB: 06-13-1960  Referred by: Alexandria Lodge, MD Reason for referral : No chief complaint on file.    Third unsuccessful attempt to reach patient for scheduled CCM SW Initial Assessment.  Scheduling Care Guide, Laverda Sorenson, has been able to connect with patient who reports that he has not received calls from [facility] BSW.  Patient has requested contact on mobile number.  CCM BSW has attempted to contact patient at scheduled times on 09/12/20, 10/05/20, and today.  Attempted to contact patient via [facility] land line X 2 as well as work mobile number X 1 Film/video editor.  No answer and voicemail box has not been set up.     Will attempt to meet with patient after clinic appointment on 12/01/20 to determine if he is still interested in CCM support.      Ronn Melena, [place] Coordination Social Worker [place] [phone]

## 2020-10-19 ENCOUNTER — Other Ambulatory Visit: Payer: Self-pay | Admitting: Internal Medicine

## 2020-10-19 DIAGNOSIS — F1190 Opioid use, unspecified, uncomplicated: Secondary | ICD-10-CM

## 2020-10-19 DIAGNOSIS — M4310 Spondylolisthesis, site unspecified: Secondary | ICD-10-CM

## 2020-10-19 NOTE — Telephone Encounter (Signed)
oxyCODONE-acetaminophen (PERCOCET/ROXICET) 5-325 MG tablet, refill request @  [place], [place] Phone:  [phone]  Fax:  [phone]

## 2020-10-31 MED FILL — JANUMET 50-500 MG TABLET: 50-500 | 30 days supply | Qty: 30 | Fill #3

## 2020-11-23 ENCOUNTER — Other Ambulatory Visit: Payer: Self-pay

## 2020-11-23 DIAGNOSIS — I10 Essential (primary) hypertension: Secondary | ICD-10-CM

## 2020-11-23 MED ORDER — LISINOPRIL 10 MG PO TABS: 10.0000 mg | ORAL_TABLET | Freq: Every day | ORAL | 1 refills | Status: DC

## 2020-11-23 MED FILL — JANUMET 50-500 MG TABLET: 50-500 | 30 days supply | Qty: 30 | Fill #4

## 2020-11-23 NOTE — Telephone Encounter (Signed)
lisinopril (ZESTRIL) 10 MG tablet, REFILL REQUEST @  [place], [place] Phone:  [phone]  Fax:  [phone]

## 2020-11-25 ENCOUNTER — Other Ambulatory Visit: Payer: Self-pay | Admitting: Student

## 2020-11-25 DIAGNOSIS — F1190 Opioid use, unspecified, uncomplicated: Secondary | ICD-10-CM

## 2020-11-25 DIAGNOSIS — M4310 Spondylolisthesis, site unspecified: Secondary | ICD-10-CM

## 2020-11-28 NOTE — Telephone Encounter (Signed)
Pls contact regarding pt pain medicine [phone]

## 2020-11-29 MED ORDER — OXYCODONE-ACETAMINOPHEN 5-325 MG PO TABS: 1.0000 | ORAL_TABLET | Freq: Three times a day (TID) | ORAL | 0 refills | Status: DC | PRN

## 2020-11-29 NOTE — Telephone Encounter (Signed)
I do not see a refill request?  Maybe he sent to the pharmacy?  I will send in if PDMP is appropriate.

## 2020-11-29 NOTE — Telephone Encounter (Signed)
Pt calls and is angry stating he ask for refill of pain med last week and there has been no response He started using ibuprofen and now is having rectal bleeding, he is advised to go to ED for this and he refuses. He states "you all are the reason that I am having it, not getting my pain medicine when I need it" Pt has upcoming appt 12/2 dr Shon Baton He is ask again to please go to ED and refuses. He is cautioned that if this continues to please caLL 911 and states he will do so.

## 2020-12-01 ENCOUNTER — Other Ambulatory Visit: Payer: Self-pay

## 2020-12-01 ENCOUNTER — Encounter: Payer: Self-pay | Admitting: Student

## 2020-12-01 ENCOUNTER — Ambulatory Visit: Payer: Self-pay

## 2020-12-01 ENCOUNTER — Ambulatory Visit (INDEPENDENT_AMBULATORY_CARE_PROVIDER_SITE_OTHER): Payer: Self-pay | Admitting: Student

## 2020-12-01 VITALS — BP 134/69 | HR 103 | Temp 98.3°F | Ht 66.0 in | Wt 175.1 lb

## 2020-12-01 DIAGNOSIS — E119 Type 2 diabetes mellitus without complications: Secondary | ICD-10-CM

## 2020-12-01 DIAGNOSIS — R21 Rash and other nonspecific skin eruption: Secondary | ICD-10-CM

## 2020-12-01 DIAGNOSIS — I10 Essential (primary) hypertension: Secondary | ICD-10-CM

## 2020-12-01 DIAGNOSIS — Z0000 Encounter for general adult medical examination without abnormal findings: Secondary | ICD-10-CM

## 2020-12-01 DIAGNOSIS — M4310 Spondylolisthesis, site unspecified: Secondary | ICD-10-CM

## 2020-12-01 LAB — POCT GLYCOSYLATED HEMOGLOBIN (HGB A1C): Hemoglobin A1C: 5.3 % (ref 4.0–5.6)

## 2020-12-01 LAB — GLUCOSE, CAPILLARY: Glucose-Capillary: 100 mg/dL — ABNORMAL HIGH (ref 70–99)

## 2020-12-01 NOTE — Assessment & Plan Note (Signed)
Patient presents for further evaluation and management of DM. Most recent hemoglobin A1c was 5.7 on 09/07/20. A1c today is 5.3% Lab Results  Component Value Date   HGBA1C 5.3 12/01/2020   Patient reports he is adherent to his Janumet 50-500 mg daily.  Plan - Patient counseled regarding lifestyle modifications including making healthy food choices and regular physical activity (at least 150 minutes per week of moderate exercise). I - Continue Janumet 50-500 mg daily

## 2020-12-01 NOTE — Assessment & Plan Note (Signed)
Recounseled patient regarding importance of COVID-19 vaccination. He continues to express skepticism regarding the efficacy of the vaccine.

## 2020-12-01 NOTE — Assessment & Plan Note (Addendum)
Patient presents for follow-up of the rash on the dorsal aspect of his bilateral hands and forearms. He was supposed to follow-up sooner, however was unable to make previously scheduled appointments. He reports he has noticed improvement in the rashes with the onset of fall/cooler weather. See Media tab for today's picture.  A/P: Given location of blistering lesions on sun-exposed skin, will screen for Porphyria Cutanea Tarda with total plasma porphyrins. Depending on result, will refer to dermatologist. - Total porphyrins in plasma  ADDENDUM: - Total porphyrins elevated at 9.6. - Would like to refer patient to dermatology, however he is uninsured. Spoke with patient regarding results, and he reports he has the paperwork to apply for insurance after meeting with our clinic social worker. He plans on mailing the paperwork tomorrow. Hesitate to initiate further work-up given likely high cost to the patient. Will place referral pending patient's insurance application.

## 2020-12-01 NOTE — Chronic Care Management (AMB) (Signed)
  Care Management    12/01/2020 Name: [name] Acosta MRN: [id number] DOB: Feb 01, 1960  Referred by: Alexandria Lodge, MD Reason for referral : Care Coordination (community resources, disease management)   [name] Acosta is a 60 y.o. year old male who is a primary care patient of Alexandria Lodge, MD. The care management team was consulted for assistance with chronic disease management and care coordination needs.   Multiple attempts made to contact patient via phone after original CCM referral received.  Met with patient today during office visit.     [name] Acosta was given information about Care Management services today including:  1. Care Management services includes personalized support from designated clinical staff supervised by his physician, including individualized plan of care and coordination with other care providers 2. 24/7 contact phone numbers for assistance for urgent and routine care needs. 3. The patient may stop case management services at any time by phone call to the office staff.  Patient agreed to services and verbal consent obtained.    Follow up plan: Initial SW assessment scheduled for 12/06/20 @ 9:30AM     Crista Nuon, [place] Coordination Social Worker [place] [phone]

## 2020-12-01 NOTE — Assessment & Plan Note (Signed)
Patient reports he did not tolerate the duloxetine 30 mg daily prescribed at his last visit on 09/07/20 secondary to onset of confusion and diarrhea after starting the medication. States he did not notice any improvement in his back pain. Reports there was a period of 4 days over the Thanksgiving holiday when he ran out of his Percocet and tried taking ibuprofen instead, however stopped after he had a single bloody BM 3 days ago on 11/28/20. Has not had additional bloody BMs since.   He notes he was told someone would reach out to him regarding application for insurance/disability. Per chart review, patient missed several phone calls from our CCM staff. Patient denies receiving any missed calls.   Plan: - Continue Percoct 5/325 1 tablet q8h PRN - AVOID NSAIDs - CCM staff Amber to meet with patient in person following today's appointment - Would like to refer patient for PT/OT and/or PM&R for possible injections once he has insurance

## 2020-12-01 NOTE — Assessment & Plan Note (Addendum)
Patient presents today for further evaluation and management of his HTN.  BP today:  Blood Pressure 12/01/2020 09/07/2020 06/15/2020 03/16/2020 02/26/2020  BP 134/69 109/65 120/72 128/69 134/80    Current antihypertensive medications: - lisinopril 10 mg daily  Patient reports He is sometimes adherent to a low-salt diet and is not checking his blood pressure at home. Denies headache or blurry vision. States he occasionally experiences dizziness upon standing quickly.  Plan: - Continue lisinopril 10 mg daily

## 2020-12-01 NOTE — Progress Notes (Signed)
   CC: follow-up of rash on hands and arms  HPI:  Mr.Elijio H Gerdeman is a 60 y.o. 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 His last clinic visit was on 09/07/20.   To see the details of this patient's management of their acute and chronic problems, please refer to the Assessment & Plan under the Encounters tab.    Past Medical History:  Diagnosis Date  . Accidental fall from ladder 12/10/2018  . Acute pain of right shoulder 11/26/2018  . Arthritis   . Bilateral lower extremity edema 06/05/2018  . Chest pain 05/14/2016   Chest pain   . Chronic pain of left ankle 08/01/2011   S/p reconstruction surgery in 1999.   . Dark stools 01/12/2020  . DM (diabetes mellitus) ([place])   . Dyslipidemia 10/18/2006      . Dyspnea on exertion 06/05/2018   Dyspnea on exertion  . Hematuria 08/01/2011   Check UA with next visit. Urine cytology in 2009 and 2012- negative. 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 0.3 mm  renal calculus in lower pole of right kidney right kidney seen in renal ultrasound in 2009. History of renal calculus,s/p lithotripsy in 2002 Cystoscopy in 2006 was negative.    . Hyperlipidemia   . Hypertension   . Prostate disorder   . Renal calculus   . Right hip pain 12/12/2015  . Sleep apnea    Review of Systems:    Review of Systems  Constitutional: Negative for chills and fever.  Eyes: Negative for blurred vision.  Respiratory: Negative for cough and shortness of breath.   Cardiovascular: Negative for chest pain and leg swelling.  Gastrointestinal: Negative for nausea and vomiting.  Genitourinary: Negative for dysuria and urgency.  Musculoskeletal: Positive for back pain.  Skin: Positive for rash.  Neurological: Negative for dizziness, weakness and headaches.    Physical Exam:  Vitals:   12/01/20 1403  BP: 134/69  Pulse: (!) 103  Temp: 98.3 F (36.8 C)  TempSrc: Oral  SpO2: 97%  Weight: 175 lb 1.6 oz  (79.4 kg)  Height: 5\' 6"  (1.676 m)   Constitutional: appears older than stated age, sitting in transport chair, in no acute distress HENT:  mucous membranes moist Cardiovascular: regular rate and rhythm, normal heart sounds, no m/r/g, no lower extremity dedma Pulmonary/Chest: normal work of breathing on room air, CTAB Skin: Scattered 1 cm^2 superficial erosions in various stages of healing on dorsal aspect of bilateral hands; no vesicles. On bilateral forearms: healing scattered scabs and scattered areas of slight hyperpigmentation.     Assessment & Plan:   See Encounters Tab for problem based charting.  Patient seen with Dr. Jimmye Norman

## 2020-12-01 NOTE — Patient Instructions (Addendum)
[name] Acosta,   Thank you for your visit to the [facility] today. It was a pleasure seeing you. Today we discussed the following:  1) Rash on hands and arms: We are obtaining a blood test to evaluate for a condition which may be causing your skin rash. I will call you with the result of this test. In the meantime, try to keep the area clean and dry if possible.  2) Low back pain: I'm sorry you experienced ill effects after taking duloxetine. Continue your other medicines for your low back pain. Hopefully we can have you connect with our financial folks to help expand our treatment options.   3) High blood pressure: continue taking your lisinopril 10 mg daily  4) Diabetes: Your hemoglobin A1c was 5.3% today. Great work! Continue your Janumet 50-500 daily   We would like to see you back after we have the result of your blood work today. For now, we will schedule you for a 110-month follow-up but will change this depending upon the result.   If you have any questions or concerns, please call our clinic at [phone] between 9am-5pm. Outside of these hours, call [phone] and ask for the internal medicine resident on call. If you feel you are having a medical emergency please call 911.

## 2020-12-02 NOTE — Progress Notes (Signed)
Internal Medicine Clinic Attending  I saw and evaluated the patient.  I personally confirmed the key portions of the history and exam documented by Dr. Shon Baton and I reviewed pertinent patient test results.  The assessment, diagnosis, and plan were formulated together and I agree with the documentation in the resident's note.  [name] Acosta skin would benefit from improved hygiene and moisturizing in general, though this is not in keeping with his entrenched habits of washing skin only during showering with use of head and shoulders shampoo. 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  Note made of the single episode of blood in BM - NSAID would usually be associated with a melena picture, though the asymptomatic nature and spontaneous resolution are somewhat reassuring.  Would f/u on these symptoms at next visit.

## 2020-12-06 ENCOUNTER — Ambulatory Visit: Payer: Self-pay | Admitting: *Deleted

## 2020-12-06 ENCOUNTER — Ambulatory Visit: Payer: Self-pay

## 2020-12-06 DIAGNOSIS — I10 Essential (primary) hypertension: Secondary | ICD-10-CM

## 2020-12-06 DIAGNOSIS — E119 Type 2 diabetes mellitus without complications: Secondary | ICD-10-CM

## 2020-12-06 DIAGNOSIS — E785 Hyperlipidemia, unspecified: Secondary | ICD-10-CM

## 2020-12-06 NOTE — Progress Notes (Signed)
Internal Medicine Clinic Resident  I have personally reviewed this encounter including the documentation in this note and/or discussed this patient with the care management provider. I will address any urgent items identified by the care management provider and will communicate my actions to the patient's PCP. I have reviewed the patient's CCM visit with my supervising attending, Dr Vincent.  Matt Alexus Michael, MD 12/06/2020   

## 2020-12-06 NOTE — Chronic Care Management (AMB) (Signed)
  Care Management   Social Work Note  12/06/2020 Name: [name] Acosta MRN: [id number] DOB: [birth year]  [name] Acosta is enrolled in a Managed Medicaid plan: No. Outreach attempt today was successful.   [name] Acosta is a 60 y.o. year old male who sees Alexandria Lodge, MD for primary care. The Care Management team was consulted for assistance with [facility] and disease management.   SDOH (Social Determinants of Health) assessments performed: Yes SDOH Interventions     Most Recent Value  SDOH Interventions  Financial Strain Interventions Other (Comment)  [Patient to be referred to [place] when signed consent is received]        Patient Care Plan: Social Work Care Plan    Problem Identified: Barriers to Treatment; lack of income and insurance   Priority: High  Onset Date: 12/06/2020    Long-Range Goal: Attempt to obtain income and/or insurance coverage   Start Date: 12/06/2020  Expected End Date: 04/19/2021  This Visit's Progress: On track  Priority: High  Note:   Current Barriers:  . Patient has no income or health insurance.    Case Manager Clinical Goal(s):  Marland Kitchen Over the next 120 days, patient will work with BSW to address needs related to Financial constraints and lack of insurance . Over the next 120 days, BSW will collaborate with RN Care Manager to address care management and care coordination needs  Interventions:  . Patient interviewed and appropriate assessments performed . Educated patient about Building control surveyor which can help him with applying for SSDI, Medicaid, and [place] SNAP . Mailed consent to patient and instructed him to send back once signed.  . Informed patient that referral can be submitted when signed consent received.  Nash Dimmer with RN Care Manager and patient to establish an individualized plan of care     Patient Self Care Activities:  . Patient will self administer medications as prescribed . Patient will  attend all scheduled provider appointments . Patient will work with BSW to address care coordination needs and will continue to work with the clinical team to address health care and disease management related needs.    Initial goal documentation        Follow Up Plan: Will submit referral to [place] when signed consent is received from patient.      Ronn Melena, [place] Coordination Social Worker [place] [phone]

## 2020-12-06 NOTE — Patient Instructions (Signed)
Visit Information   Patient Care Plan: Social Work Care Plan    Problem Identified: Barriers to Treatment; lack of income and insurance   Priority: High  Onset Date: 12/06/2020    Long-Range Goal: Attempt to obtain income and/or insurance coverage   Start Date: 12/06/2020  Expected End Date: 04/19/2021  This Visit's Progress: On track  Priority: High  Note:   Current Barriers:  . Patient has no income or health insurance.    Case Manager Clinical Goal(s):  Marland Kitchen Over the next 120 days, patient will work with BSW to address needs related to Financial constraints and lack of insurance . Over the next 120 days, BSW will collaborate with RN Care Manager to address care management and care coordination needs  Interventions:  . Patient interviewed and appropriate assessments performed . Educated patient about Building control surveyor which can help him with applying for SSDI, Medicaid, and [place] SNAP . Mailed consent to patient and instructed him to send back once signed.  . Informed patient that referral can be submitted when signed consent received.  Nash Dimmer with RN Care Manager and patient to establish an individualized plan of care     Patient Self Care Activities:  . Patient will self administer medications as prescribed . Patient will attend all scheduled provider appointments . Patient will work with BSW to address care coordination needs and will continue to work with the clinical team to address health care and disease management related needs.    Initial goal documentation         [name] Acosta was given information about Care Management services today including:  1. Care Management services include personalized support from designated clinical staff supervised by his physician, including individualized plan of care and coordination with other care providers 2. 24/7 contact phone numbers for assistance for urgent and routine care needs. 3. The patient may stop CCM  services at any time (effective at the end of the month) by phone call to the office staff.  Patient agreed to services and verbal consent obtained.   The patient verbalized understanding of instructions, educational materials, and care plan provided today and declined offer to receive copy of patient instructions, educational materials, and care plan.   Will submit referral to [place] when signed consent is received from patient.     Ronn Melena, [place] Coordination Social Worker [place] [phone]

## 2020-12-06 NOTE — Patient Instructions (Signed)
Visit Information  Patient Care Plan: Social Work Care Plan    Problem Identified: Barriers to Treatment; lack of income and insurance   Priority: High  Onset Date: 12/06/2020    Long-Range Goal: Attempt to obtain income and/or insurance coverage   Start Date: 12/06/2020  Expected End Date: 04/19/2021  This Visit's Progress: On track  Priority: High  Note:   Current Barriers:  . Patient has no income or health insurance.    Case Manager Clinical Goal(s):  Marland Kitchen Over the next 120 days, patient will work with BSW to address needs related to Financial constraints and lack of insurance . Over the next 120 days, BSW will collaborate with RN Care Manager to address care management and care coordination needs  Interventions:  . Patient interviewed and appropriate assessments performed . Educated patient about Building control surveyor which can help him with applying for SSDI, Medicaid, and [place] SNAP . Mailed consent to patient and instructed him to send back once signed.  . Informed patient that referral can be submitted when signed consent received.  Nash Dimmer with RN Care Manager and patient to establish an individualized plan of care     Patient Self Care Activities:  . Patient will self administer medications as prescribed . Patient will attend all scheduled provider appointments . Patient will work with BSW to address care coordination needs and will continue to work with the clinical team to address health care and disease management related needs.    Initial goal documentation    Problem Identified: Patient told CCM social worker he has no income and no Scientist, product/process development and is asking for assitance.   Priority: High  Onset Date: 12/06/2020  Note:   CARE PLAN ENTRY (see longtitudinal plan of care for additional care plan information)   Current Barriers:  . Chronic Disease Management support, education, and care coordination needs related to HTN, HLD, DMII, and chronic back  and  knee pain  Case Manager Clinical Goal(s):  Marland Kitchen Over the next 30-90  days, patient will work with BSW to address needs related to Financial constraints related to no income and no health insurance  in patient with HTN, HLD, DMII, and chronic back and knee pain  Interventions:  . Collaborated with BSW to initiate plan of care to address needs related to Financial constraints related to no income and no health insurance  in patient with HTN, HLD, DMII, and chronic back and knee pain  Patient Self Care Activities:  . Patient will work with BSW to address care coordination needs and will continue to work with the clinical team to address health care and disease management related needs.    Initial goal documentation      [name] Acosta was given information about Care Management services today including:  1. Care Management services include personalized support from designated clinical staff supervised by his physician, including individualized plan of care and coordination with other care providers 2. 24/7 contact phone numbers for assistance for urgent and routine care needs. 3. The patient may stop CCM services at any time (effective at the end of the month) by phone call to the office staff.  Patient agreed to services and verbal consent obtained.   The patient verbalized understanding of instructions, educational materials, and care plan provided today and declined offer to receive copy of patient instructions, educational materials, and care plan.   The care management team will reach out to the patient again over the next 30 days.  Kelli Churn RN, CCM, [facility] RN Care Manager [phone]

## 2020-12-06 NOTE — Chronic Care Management (AMB) (Signed)
Care Management   Initial Visit Note  12/06/2020 Name: [name] Acosta MRN: [id number] DOB: April 17, 1960  [name] Acosta is enrolled in a Managed Medicaid plan: No. Outreach attempt today was successful.   Subjective:   Objective:  Assessment: [name] Acosta is a 60 y.o. year old male who sees [name] Lodge, MD for primary care. The care management team was consulted for assistance with care management and care coordination needs related to Care Coordination and Other to address finanacial and health insurance concerns.   Review of patient status, including review of consultants reports, relevant laboratory and other test results, and collaboration with appropriate care team members and the patient's provider was performed as part of comprehensive patient evaluation and provision of care management services.    SDOH (Social Determinants of Health) screening performed today. See Care Plan Entry related to challenges with: Financial Strain   Patient Care Plan: Social Work Care Plan    Problem Identified: Barriers to Treatment; lack of income and insurance   Priority: High  Onset Date: 12/06/2020    Long-Range Goal: Attempt to obtain income and/or insurance coverage   Start Date: 12/06/2020  Expected End Date: 04/19/2021  This Visit's Progress: On track  Priority: High  Note:   Current Barriers:  . Patient has no income or health insurance.    Case Manager Clinical Goal(s):  [name] Acosta Kitchen Over the next 120 days, patient will work with BSW to address needs related to Financial constraints and lack of insurance . Over the next 120 days, BSW will collaborate with RN Care Manager to address care management and care coordination needs  Interventions:  . Patient interviewed and appropriate assessments performed . Educated patient about Building control surveyor which can help him with applying for SSDI, Medicaid, and [place] SNAP . Mailed consent to patient and instructed him to send back once signed.    . Informed patient that referral can be submitted when signed consent received.  Nash Dimmer with RN Care Manager and patient to establish an individualized plan of care     Patient Self Care Activities:  . Patient will self administer medications as prescribed . Patient will attend all scheduled provider appointments . Patient will work with BSW to address care coordination needs and will continue to work with the clinical team to address health care and disease management related needs.    Initial goal documentation    Problem Identified: Patient told CCM social worker he has no income and no Scientist, product/process development and is asking for assitance.   Priority: High  Onset Date: 12/06/2020  Note:   CARE PLAN ENTRY (see longtitudinal plan of care for additional care plan information)   Current Barriers:  . Chronic Disease Management support, education, and care coordination needs related to HTN, HLD, DMII, and chronic back and  knee pain  Case Manager Clinical Goal(s):  [name] Acosta Kitchen Over the next 30-90  days, patient will work with BSW to address needs related to Financial constraints related to no income and no health insurance  in patient with HTN, HLD, DMII, and chronic back and knee pain  Interventions:  . Collaborated with BSW to initiate plan of care to address needs related to Financial constraints related to no income and no health insurance  in patient with HTN, HLD, DMII, and chronic back and knee pain  Patient Self Care Activities:  . Patient will work with BSW to address care coordination needs and will continue to work with the clinical team to address  health care and disease management related needs.    Initial goal documentation       Follow up plan:  The care management team will reach out to the patient again over the next 30 days.   Mr. Ono was given information about Care Management services today including:  1. Care Management services include personalized support from  designated clinical staff supervised by a physician, including individualized plan of care and coordination with other care providers 2. 24/7 contact phone numbers for assistance for urgent and routine care needs. 3. The patient may stop CCM services at any time (effective at the end of the month) by phone call to the office staff.  Patient agreed to services and verbal consent obtained.  Kelli Churn RN, CCM, [facility] RN Care Manager [phone]

## 2020-12-07 ENCOUNTER — Other Ambulatory Visit: Payer: Self-pay | Admitting: *Deleted

## 2020-12-07 ENCOUNTER — Other Ambulatory Visit: Payer: Self-pay | Admitting: Internal Medicine

## 2020-12-07 DIAGNOSIS — E119 Type 2 diabetes mellitus without complications: Secondary | ICD-10-CM

## 2020-12-07 MED ORDER — JANUMET 50-500 MG PO TABS: 1.0000 | ORAL_TABLET | Freq: Every day | ORAL | 5 refills | Status: DC

## 2020-12-07 MED FILL — JANUMET 50-500 MG TABLET: 50-500 | 30 days supply | Qty: 30 | Fill #0

## 2020-12-07 NOTE — Progress Notes (Signed)
Internal Medicine Clinic Attending  CCM services provided by the care management provider and their documentation were discussed with Dr. Johnson. We reviewed the pertinent findings, urgent action items addressed by the resident and non-urgent items to be addressed by the PCP.  I agree with the assessment, diagnosis, and plan of care documented in the CCM and resident's note.  Kia Varnadore Thomas Kaliah Haddaway, MD 12/07/2020  

## 2020-12-07 NOTE — Telephone Encounter (Signed)
Call from [place] requesting doctor to re-send Janumet rx with "[facility]". Thanks

## 2020-12-12 ENCOUNTER — Ambulatory Visit: Payer: Self-pay | Admitting: *Deleted

## 2020-12-12 DIAGNOSIS — E785 Hyperlipidemia, unspecified: Secondary | ICD-10-CM

## 2020-12-12 DIAGNOSIS — I10 Essential (primary) hypertension: Secondary | ICD-10-CM

## 2020-12-12 DIAGNOSIS — G8929 Other chronic pain: Secondary | ICD-10-CM

## 2020-12-12 DIAGNOSIS — E119 Type 2 diabetes mellitus without complications: Secondary | ICD-10-CM

## 2020-12-12 NOTE — Chronic Care Management (AMB) (Signed)
°  Care Management   Note  12/12/2020 Name: [name] Acosta MRN: [id number] DOB: 08/29/1960  [name] is enrolled in a Managed Medicaid plan: No. Outreach attempt today was successful.   Discussed role of CCM RN and reviewed health history and assessed patient's self management strategies related to NIDDM, HTN, HLD and chronic pain. He is meeting target treatment for all chronic disease states. He says his sister and the drug store he uses in [place] help him with the costs of his medicines. He reports over the last 1 1 /2 years he has lost about 50 lbs purposefully after he was counseled time and time again by his providers to lose weight.  No chronic care management needs for CCM RN services identified. Patient advised to notify clinic providers should he benefit from [facility] RN services in the future.    No further follow up required: No  CCM RN assistance needs identified.   Kelli Churn RN, CCM, [facility] RN Care Manager [phone]

## 2020-12-13 LAB — PORPHYRINS, TOTAL PLASMA: Porphyrins: 9.6 ug/dL — ABNORMAL HIGH (ref 0.0–1.0)

## 2020-12-13 NOTE — Progress Notes (Signed)
Internal Medicine Clinic Resident  I have personally reviewed this encounter including the documentation in this note and/or discussed this patient with the care management provider. I will address any urgent items identified by the care management provider and will communicate my actions to the patient's PCP. I have reviewed the patient's CCM visit with my supervising attending, Dr Rebeca Alert.  Mitzi Hansen, MD Internal Medicine Resident PGY-2 [facility] Res[facility] Pager: [phone] 12/13/2020 4:16 PM

## 2020-12-14 NOTE — Progress Notes (Signed)
Internal Medicine Clinic Attending  CCM services provided by the care management provider and their documentation were reviewed with Dr. Christian.  We reviewed the pertinent findings, urgent action items addressed by the resident and non-urgent items to be addressed by the PCP.  I agree with the assessment, diagnosis, and plan of care documented in the CCM and resident's note.  Philomina Alvon N Kashus Karlen, MD 12/14/2020  

## 2020-12-15 ENCOUNTER — Ambulatory Visit (INDEPENDENT_AMBULATORY_CARE_PROVIDER_SITE_OTHER): Payer: Self-pay | Admitting: Internal Medicine

## 2020-12-15 ENCOUNTER — Encounter: Payer: Self-pay | Admitting: Internal Medicine

## 2020-12-15 ENCOUNTER — Other Ambulatory Visit: Payer: Self-pay

## 2020-12-15 DIAGNOSIS — S39012A Strain of muscle, fascia and tendon of lower back, initial encounter: Secondary | ICD-10-CM

## 2020-12-15 HISTORY — DX: Strain of muscle, fascia and tendon of lower back, initial encounter: S39.012A
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
## 2020-12-19 NOTE — Progress Notes (Signed)
Internal Medicine Clinic Attending  Case discussed with Dr. Christian  At the time of the visit.  We reviewed the resident's history and pertinent patient test results.  I agree with the assessment, diagnosis, and plan of care documented in the resident's note.  

## 2020-12-22 ENCOUNTER — Telehealth: Payer: Self-pay

## 2020-12-22 ENCOUNTER — Ambulatory Visit: Payer: Self-pay

## 2020-12-22 ENCOUNTER — Other Ambulatory Visit: Payer: Self-pay | Admitting: Internal Medicine

## 2020-12-22 DIAGNOSIS — M4310 Spondylolisthesis, site unspecified: Secondary | ICD-10-CM

## 2020-12-22 DIAGNOSIS — E785 Hyperlipidemia, unspecified: Secondary | ICD-10-CM

## 2020-12-22 DIAGNOSIS — F1190 Opioid use, unspecified, uncomplicated: Secondary | ICD-10-CM

## 2020-12-22 DIAGNOSIS — I10 Essential (primary) hypertension: Secondary | ICD-10-CM

## 2020-12-22 DIAGNOSIS — E119 Type 2 diabetes mellitus without complications: Secondary | ICD-10-CM

## 2020-12-22 MED ORDER — OXYCODONE-ACETAMINOPHEN 5-325 MG PO TABS: 1.0000 | ORAL_TABLET | Freq: Three times a day (TID) | ORAL | 0 refills | Status: DC | PRN

## 2020-12-22 NOTE — Progress Notes (Signed)
Internal Medicine Clinic Resident  I have personally reviewed this encounter including the documentation in this note and/or discussed this patient with the care management provider. I will address any urgent items identified by the care management provider and will communicate my actions to the patient's PCP. I have reviewed the patient's CCM visit with my supervising attending, Dr Mullen.  Virl Coble K Blima Jaimes, MD 12/22/2020   
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
## 2020-12-22 NOTE — Telephone Encounter (Signed)
  Chronic Care Management   Outreach Note  12/22/2020 Name: QUAVIS KLUTZ MRN: [id number] DOB: 1960/05/07  Referred by: Alexandria Lodge, MD Reason for referral : Care Coordination  Patient requesting call regarding refill of Oxycodone for back pain. Wants to ensure he can get refill at the end of the month since [facility] is closed on 12/30 and 12/31     Ronn Melena, [place] Coordination Social Worker [place] [phone]

## 2020-12-22 NOTE — Addendum Note (Signed)
Addended by: Gaylyn Rong on: 12/22/2020 09:31 AM   Modules accepted: Orders

## 2020-12-26 ENCOUNTER — Ambulatory Visit: Payer: Self-pay

## 2020-12-26 DIAGNOSIS — E785 Hyperlipidemia, unspecified: Secondary | ICD-10-CM

## 2020-12-26 DIAGNOSIS — I10 Essential (primary) hypertension: Secondary | ICD-10-CM

## 2020-12-26 DIAGNOSIS — E119 Type 2 diabetes mellitus without complications: Secondary | ICD-10-CM

## 2020-12-26 NOTE — Progress Notes (Signed)
Internal Medicine Clinic Attending  CCM services provided by the care management provider and their documentation were discussed with Dr. Agyei. We reviewed the pertinent findings, urgent action items addressed by the resident and non-urgent items to be addressed by the PCP.  I agree with the assessment, diagnosis, and plan of care documented in the CCM and resident's note.  Feige Lowdermilk, MD 12/26/2020  

## 2020-12-26 NOTE — Patient Instructions (Signed)
Visit Information  Patient Care Plan: Social Work Care Plan    Problem Identified: Barriers to Treatment; lack of income and insurance   Priority: High  Onset Date: 12/06/2020    Long-Range Goal: Attempt to obtain income and/or insurance coverage   Start Date: 12/06/2020  Expected End Date: 04/19/2021  Recent Progress: On track  Priority: High  Note:   Current Barriers:  . Patient has no income or health insurance.    Case Manager Clinical Goal(s):  Marland Kitchen Over the next 120 days, patient will work with BSW to address needs related to Financial constraints and lack of insurance . Over the next 120 days, BSW will collaborate with RN Care Manager to address care management and care coordination needs  Interventions:  . Contacted patient to ensure receipt of consent for referral to Disability Assistance Program that was mailed for second time on 12/22/20.   Patient reports he has not yet received it.   Patient Self Care Activities:  . Patient will self administer medications as prescribed . Patient will attend all scheduled provider appointments . Patient will work with BSW to address care coordination needs and will continue to work with the clinical team to address health care and disease management related needs.    Please see past updates related to this goal by clicking on the "Past Updates" button in the selected goal     Problem Identified: Patient told CCM social worker he has no income and no Programmer, applications and is asking for assitance.   Priority: High  Onset Date: 12/06/2020  Note:   CARE PLAN ENTRY (see longtitudinal plan of care for additional care plan information)   Current Barriers:  . Chronic Disease Management support, education, and care coordination needs related to HTN, HLD, DMII, and chronic back and  knee pain  Case Manager Clinical Goal(s):  Marland Kitchen Over the next 30-90  days, patient will work with BSW to address needs related to Financial constraints related to no  income and no health insurance  in patient with HTN, HLD, DMII, and chronic back and knee pain  Interventions:  . Collaborated with BSW to initiate plan of care to address needs related to Financial constraints related to no income and no health insurance  in patient with HTN, HLD, DMII, and chronic back and knee pain  Patient Self Care Activities:  . Patient will work with BSW to address care coordination needs and will continue to work with the clinical team to address health care and disease management related needs.    Initial goal documentation      The patient verbalized understanding of instructions, educational materials, and care plan provided today and declined offer to receive copy of patient instructions, educational materials, and care plan.     Telephone follow up appointment with care management team member scheduled for:12/28/20  Malachy Chamber, [place] Embedded Care Coordination Social Worker [facility] [phone]

## 2020-12-26 NOTE — Chronic Care Management (AMB) (Signed)
Care Management   Follow Up Note   12/26/2020 Name: [name] Acosta MRN: [id number] DOB: 25-Aug-1960  [name] Acosta is enrolled in a Managed Medicaid plan: No. Outreach attempt today was successful.    Referred by: [name] Lodge, MD Reason for referral : [name] Acosta is a 60 y.o. year old male who is a primary care patient of [name] Lodge, MD. The care management team was consulted for assistance with care management and care coordination needs.    Review of patient status, including review of consultants reports, relevant laboratory and other test results, and collaboration with appropriate care team members and the patient's provider was performed as part of comprehensive patient evaluation and provision of chronic care management services.    Patient Care Plan: Social Work Care Plan    Problem Identified: Barriers to Treatment; lack of income and insurance   Priority: High  Onset Date: 12/06/2020    Long-Range Goal: Attempt to obtain income and/or insurance coverage   Start Date: 12/06/2020  Expected End Date: 04/19/2021  Recent Progress: On track  Priority: High  Note:   Current Barriers:  . Patient has no income or health insurance.    Case Manager Clinical Goal(s):  Marland Kitchen Over the next 120 days, patient will work with BSW to address needs related to Financial constraints and lack of insurance . Over the next 120 days, BSW will collaborate with RN Care Manager to address care management and care coordination needs  Interventions:  . Contacted patient to ensure receipt of consent for referral to [place] that was mailed for second time on 12/22/20.   Patient reports he has not yet received it.   Patient Self Care Activities:  . Patient will self administer medications as prescribed . Patient will attend all scheduled provider appointments . Patient will work with BSW to address care coordination needs and will continue to work with  the clinical team to address health care and disease management related needs.    Please see past updates related to this goal by clicking on the "Past Updates" button in the selected goal     Problem Identified: Patient told CCM social worker he has no income and no Scientist, product/process development and is asking for assitance.   Priority: High  Onset Date: 12/06/2020  Note:   CARE PLAN ENTRY (see longtitudinal plan of care for additional care plan information)   Current Barriers:  . Chronic Disease Management support, education, and care coordination needs related to HTN, HLD, DMII, and chronic back and  knee pain  Case Manager Clinical Goal(s):  Marland Kitchen Over the next 30-90  days, patient will work with BSW to address needs related to Financial constraints related to no income and no health insurance  in patient with HTN, HLD, DMII, and chronic back and knee pain  Interventions:  . Collaborated with BSW to initiate plan of care to address needs related to Financial constraints related to no income and no health insurance  in patient with HTN, HLD, DMII, and chronic back and knee pain  Patient Self Care Activities:  . Patient will work with BSW to address care coordination needs and will continue to work with the clinical team to address health care and disease management related needs.    Initial goal documentation        Telephone follow up appointment with care management team member scheduled for:12/28/20  [name] Acosta, [place] Worker [place] [facility]  [place] [phone]

## 2020-12-27 NOTE — Progress Notes (Signed)
Internal Medicine Clinic Resident  I have personally reviewed this encounter including the documentation in this note and/or discussed this patient with the care management provider. I will address any urgent items identified by the care management provider and will communicate my actions to the patient's PCP. I have reviewed the patient's CCM visit with my supervising attending, Dr Daryll Drown.  Marty Heck, DO 12/27/2020

## 2020-12-28 ENCOUNTER — Ambulatory Visit: Payer: Self-pay

## 2020-12-28 DIAGNOSIS — E785 Hyperlipidemia, unspecified: Secondary | ICD-10-CM

## 2020-12-28 DIAGNOSIS — E119 Type 2 diabetes mellitus without complications: Secondary | ICD-10-CM

## 2020-12-28 DIAGNOSIS — I10 Essential (primary) hypertension: Secondary | ICD-10-CM

## 2020-12-28 NOTE — Patient Instructions (Signed)
Visit Information  Patient Care Plan: Social Work Care Plan    Problem Identified: Barriers to Treatment; lack of income and insurance   Priority: High  Onset Date: 12/06/2020    Long-Range Goal: Attempt to obtain income and/or insurance coverage   Start Date: 12/06/2020  Expected End Date: 04/19/2021  Recent Progress: On track  Priority: High  Note:   Current Barriers:  . Patient has no income or health insurance.    Case Manager Clinical Goal(s):  Marland Kitchen Over the next 120 days, patient will work with BSW to address needs related to Financial constraints and lack of insurance . Over the next 120 days, BSW will collaborate with RN Care Manager to address care management and care coordination needs  Interventions:  . Directed patient to send signed consent for Disability Assistance Program to [facility] . Collaborated with RNCM, Cranford Mon, and SW, [name] Acosta regarding assistance with completion/submission of referral once consent is received.   Patient Self Care Activities:  . Patient will self administer medications as prescribed . Patient will attend all scheduled provider appointments . Patient will work with BSW to address care coordination needs and will continue to work with the clinical team to address health care and disease management related needs.    Please see past updates related to this goal by clicking on the "Past Updates" button in the selected goal     Problem Identified: Patient told CCM social worker he has no income and no Programmer, applications and is asking for assitance.   Priority: High  Onset Date: 12/06/2020  Note:   CARE PLAN ENTRY (see longtitudinal plan of care for additional care plan information)   Current Barriers:  . Chronic Disease Management support, education, and care coordination needs related to HTN, HLD, DMII, and chronic back and  knee pain  Case Manager Clinical Goal(s):  Marland Kitchen Over the next 30-90  days, patient will work with BSW to address needs  related to Financial constraints related to no income and no health insurance  in patient with HTN, HLD, DMII, and chronic back and knee pain  Interventions:  . Collaborated with BSW to initiate plan of care to address needs related to Financial constraints related to no income and no health insurance  in patient with HTN, HLD, DMII, and chronic back and knee pain  Patient Self Care Activities:  . Patient will work with BSW to address care coordination needs and will continue to work with the clinical team to address health care and disease management related needs.    Initial goal documentation      The patient verbalized understanding of instructions, educational materials, and care plan provided today and declined offer to receive copy of patient instructions, educational materials, and care plan.   SW, [name] Acosta, will assist with referral to Disability Assistance Program when signed consent is received.       [name] Acosta, BSW Embedded Care Coordination Social Worker [facility] [phone]

## 2020-12-28 NOTE — Progress Notes (Signed)
Internal Medicine Clinic Attending  CCM services provided by the care management provider and their documentation were discussed with Dr. Seawell. We reviewed the pertinent findings, urgent action items addressed by the resident and non-urgent items to be addressed by the PCP.  I agree with the assessment, diagnosis, and plan of care documented in the CCM and resident's note.  Idrissa Beville, MD 12/28/2020  

## 2020-12-28 NOTE — Chronic Care Management (AMB) (Signed)
Care Management   Follow Up Note   12/28/2020 Name: [name] Acosta MRN: [id number] DOB: Sep 01, 1960  [name] is enrolled in a Managed Medicaid plan: No. Outreach attempt today was successful.    Referred by: Alphonzo Severance, MD Reason for referral : Care Coordination   [name] Acosta is a 60 y.o. year old male who is a primary care patient of Alphonzo Severance, MD. The care management team was consulted for assistance with care management and care coordination needs.    Review of patient status, including review of consultants reports, relevant laboratory and other test results, and collaboration with appropriate care team members and the patient's provider was performed as part of comprehensive patient evaluation and provision of chronic care management services.    Patient Care Plan: Social Work Care Plan    Problem Identified: Barriers to Treatment; lack of income and insurance   Priority: High  Onset Date: 12/06/2020    Long-Range Goal: Attempt to obtain income and/or insurance coverage   Start Date: 12/06/2020  Expected End Date: 04/19/2021  Recent Progress: On track  Priority: High  Note:   Current Barriers:  . Patient has no income or health insurance.    Case Manager Clinical Goal(s):  Marland Kitchen Over the next 120 days, patient will work with BSW to address needs related to Financial constraints and lack of insurance . Over the next 120 days, BSW will collaborate with RN Care Manager to address care management and care coordination needs  Interventions:  . Directed patient to send signed consent for Disability Assistance Program to [facility] . Collaborated with RNCM, Cranford Mon, and SW, Gus Puma regarding assistance with completion/submission of referral once consent is received.   Patient Self Care Activities:  . Patient will self administer medications as prescribed . Patient will attend all scheduled provider appointments . Patient will work with BSW to address care  coordination needs and will continue to work with the clinical team to address health care and disease management related needs.    Please see past updates related to this goal by clicking on the "Past Updates" button in the selected goal     Problem Identified: Patient told CCM social worker he has no income and no Programmer, applications and is asking for assitance.   Priority: High  Onset Date: 12/06/2020  Note:   CARE PLAN ENTRY (see longtitudinal plan of care for additional care plan information)   Current Barriers:  . Chronic Disease Management support, education, and care coordination needs related to HTN, HLD, DMII, and chronic back and  knee pain  Case Manager Clinical Goal(s):  Marland Kitchen Over the next 30-90  days, patient will work with BSW to address needs related to Financial constraints related to no income and no health insurance  in patient with HTN, HLD, DMII, and chronic back and knee pain  Interventions:  . Collaborated with BSW to initiate plan of care to address needs related to Financial constraints related to no income and no health insurance  in patient with HTN, HLD, DMII, and chronic back and knee pain  Patient Self Care Activities:  . Patient will work with BSW to address care coordination needs and will continue to work with the clinical team to address health care and disease management related needs.       SW, Gus Puma, will assist with referral to Disability Assistance Program once signed consent is received.       Contractor, BSW Embedded Care Coordination Social  [place] [phone]

## 2021-01-02 NOTE — Progress Notes (Signed)
Internal Medicine Clinic Resident  I have personally reviewed this encounter including the documentation in this note and/or discussed this patient with the care management provider. I will address any urgent items identified by the care management provider and will communicate my actions to the patient's PCP. I have reviewed the patient's CCM visit with my supervising attending, Dr Philipp Ovens.  Delice Bison, DO 01/02/2021

## 2021-01-02 NOTE — Progress Notes (Signed)
Internal Medicine Clinic Attending  CCM services provided by the care management provider and their documentation were discussed with Dr. Koleen Distance. We reviewed the pertinent findings, urgent action items addressed by the resident and non-urgent items to be addressed by the PCP.  I agree with the assessment, diagnosis, and plan of care documented in the CCM and resident's note.  Velna Ochs, MD 01/02/2021

## 2021-01-04 MED FILL — JANUMET 50-500 MG TABLET: 50-500 | 30 days supply | Qty: 30 | Fill #1

## 2021-01-09 ENCOUNTER — Other Ambulatory Visit: Payer: Self-pay | Admitting: Internal Medicine

## 2021-01-09 DIAGNOSIS — N400 Enlarged prostate without lower urinary tract symptoms: Secondary | ICD-10-CM

## 2021-01-30 ENCOUNTER — Other Ambulatory Visit: Payer: Self-pay | Admitting: Internal Medicine

## 2021-01-30 DIAGNOSIS — M4310 Spondylolisthesis, site unspecified: Secondary | ICD-10-CM

## 2021-01-30 DIAGNOSIS — F1190 Opioid use, unspecified, uncomplicated: Secondary | ICD-10-CM

## 2021-01-30 NOTE — Telephone Encounter (Signed)
Need refill on pain medicine; pt contact [place], [place]

## 2021-01-30 NOTE — Telephone Encounter (Signed)
Last office visit: 12/15/20 (telehealth) 12/01/20 (in person) Last UDS: 09/07/20 Last Written Date: 12/22/20 #90 Next appt: none scheduled

## 2021-02-07 MED FILL — JANUMET 50-500 MG TABLET: 50-500 | 30 days supply | Qty: 30 | Fill #2

## 2021-02-16 ENCOUNTER — Telehealth: Payer: Self-pay | Admitting: *Deleted
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
## 2021-02-16 NOTE — Telephone Encounter (Signed)
Agree, patient should seek care at ED. Thank you.

## 2021-02-16 NOTE — Telephone Encounter (Signed)
He needs to go to the ER ASAP

## 2021-03-01 ENCOUNTER — Other Ambulatory Visit: Payer: Self-pay

## 2021-03-01 ENCOUNTER — Other Ambulatory Visit: Payer: Self-pay | Admitting: Student

## 2021-03-01 DIAGNOSIS — F1190 Opioid use, unspecified, uncomplicated: Secondary | ICD-10-CM

## 2021-03-01 DIAGNOSIS — M4310 Spondylolisthesis, site unspecified: Secondary | ICD-10-CM

## 2021-03-01 NOTE — Telephone Encounter (Signed)
Just called and spoke with the patient. He is agreeable to coming in for an in-person appointment either tomorrow (3/3) or Friday (3/4) for an in-person appointment since it has been ~6 months since his last urine drug screen. Will wait to refill his Percocet prescription until then.  I will message the front desk to schedule this appointment.

## 2021-03-01 NOTE — Telephone Encounter (Signed)
Pt is requesting his oxyCODONE-acetaminophen (PERCOCET/ROXICET) 5-325 MG tablet sent to  [place], [place] Phone:  [phone]  Fax:  [phone]      ( pt stated that he has enough till Friday 03/03/21/ )

## 2021-03-01 NOTE — Telephone Encounter (Signed)
Last rx written 01/30/21. Last OV 12/15/20. Next OV 03/03/21 with PCP. UDS 09/07/20.

## 2021-03-03 ENCOUNTER — Other Ambulatory Visit: Payer: Self-pay

## 2021-03-03 ENCOUNTER — Encounter: Payer: Self-pay | Admitting: Student

## 2021-03-03 ENCOUNTER — Ambulatory Visit (INDEPENDENT_AMBULATORY_CARE_PROVIDER_SITE_OTHER): Payer: Self-pay | Admitting: Student

## 2021-03-03 DIAGNOSIS — F1190 Opioid use, unspecified, uncomplicated: Secondary | ICD-10-CM

## 2021-03-03 DIAGNOSIS — M4310 Spondylolisthesis, site unspecified: Secondary | ICD-10-CM

## 2021-03-03 DIAGNOSIS — R21 Rash and other nonspecific skin eruption: Secondary | ICD-10-CM

## 2021-03-03 MED ORDER — OXYCODONE-ACETAMINOPHEN 5-325 MG PO TABS
ORAL_TABLET | ORAL | 0 refills | Status: DC
Start: 2021-03-03 — End: 2021-03-30

## 2021-03-03 NOTE — Assessment & Plan Note (Signed)
Patient's rash appears improved from previous visits.  It is mostly scabbed over.  See this note or the media tab for up-to-date picture. 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  Unfortunately patient has not yet completed his paperwork to apply for insurance, so referral to dermatology would be cost prohibitive at this point.   Plan: -Patient hopefully will have completed his insurance paperwork by the time of his next appointment so that he can have a productive meeting with our financial counselor at that time - Instructed patient to check his skin from the sun, ensure adequate vitamin C intake -He was previously tested for HIV and hepatitis, however will consider repeat testing in the future as it has been >5 years since he was tested
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
## 2021-03-03 NOTE — Patient Instructions (Addendum)
[name] Acosta,   Thank you for your visit to the [facility] today. It was a pleasure seeing you. Today we discussed the following:  1) Chronic pain / low back pain - I refilled your Percocet. I would like to see you back in 1 month to refill your prescription in person - At that visit, or sooner if possible, I would like you to complete the paperwork you have at home for getting insurance. We will arrange for you to meet with our financial counselor, [name]. - If your pain is no longer manageable and/or you are unable to control your bowel or bladder function please go to the emergency department.  2) Rash on hands - As above, please work on getting that paperwork filled out so we can make a referral to dermatology. - Use sun protection, make sure you are getting adequate vitamin C   We would like to see you back in 1 month or sooner if necessary. Please bring all of your medications with you.   If you have any questions or concerns, please call our clinic at [phone] between 9am-5pm. Outside of these hours, call [phone] and ask for the internal medicine resident on call. If you feel you are having a medical emergency please call 911.

## 2021-03-03 NOTE — Progress Notes (Signed)
   CC: pain medication refill  HPI:  Mr.Ronal H Kottke is a 61 y.o. man with history as below who presents to clinic for refill of his chronic opioid. His last clinic visit was a telehealth visit on 12/15/20 with Dr. Darrick Meigs.   To see the details of this patient's management of their acute and chronic problems, please refer to the Assessment & Plan under the Encounters tab.    Past Medical History:  Diagnosis Date  . Accidental fall from ladder 12/10/2018  . Acute pain of right shoulder 11/26/2018  . Arthritis   . Bilateral lower extremity edema 06/05/2018  . Chest pain 05/14/2016   Chest pain   . Chronic pain of left ankle 08/01/2011   S/p reconstruction surgery in 1999.   . Dark stools 01/12/2020  . DM (diabetes mellitus) ([place])   . Dyslipidemia 10/18/2006      . Dyspnea on exertion 06/05/2018   Dyspnea on exertion  . Hematuria 08/01/2011   Check UA with next visit. Urine cytology in 2009 and 2012- negative. 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 0.3 mm  renal calculus in lower pole of right kidney right kidney seen in renal ultrasound in 2009. History of renal calculus,s/p lithotripsy in 2002 Cystoscopy in 2006 was negative.    . Hyperlipidemia   . Hypertension   . Prostate disorder   . Renal calculus   . Right hip pain 12/12/2015  . Sleep apnea    Review of Systems:    Review of Systems  Constitutional: Negative for chills and fever.  Respiratory: Negative for shortness of breath.   Cardiovascular: Negative for chest pain.  Gastrointestinal: Negative for abdominal pain.  Musculoskeletal: Positive for back pain and falls.  Skin: Positive for rash.  Neurological: Negative for dizziness, focal weakness, weakness and headaches.    Physical Exam:  Vitals:   03/03/21 1043  BP: (!) 122/58  Pulse: 81  Temp: 98.1 F (36.7 C)  TempSrc: Oral  SpO2: 100%  Weight: 188 lb 11.2 oz (85.6 kg)  Height: 5\' 6"  (1.676 m)   Constitutional:appears older than stated age, sitting in transport  chair, in no acute distress HENT: mucous membranes moist Cardiovascular:regular rate and rhythm, normal heart sounds, no m/r/g, no lower extremity edema Pulmonary/Chest:normal work of breathing on room air, CTAB Skin:Scattered crusted lesions on dorsal aspect of bilateral hands; no vesicles, improved from prior (see Media tab or below) MSK: point tenderness over lumbar spine       Assessment & Plan:   See Encounters Tab for problem based charting.  Patient discussed with Dr. Rebeca Alert

## 2021-03-07 NOTE — Progress Notes (Signed)
Internal Medicine Clinic Attending  Case discussed with Dr. Watson at the time of the visit.  We reviewed the resident's history and exam and pertinent patient test results.  I agree with the assessment, diagnosis, and plan of care documented in the resident's note.  Treavor Blomquist, M.D., Ph.D.  

## 2021-03-09 MED FILL — JANUMET 50-500 MG TABLET: 50-500 | 30 days supply | Qty: 30 | Fill #3

## 2021-03-30 ENCOUNTER — Other Ambulatory Visit: Payer: Self-pay

## 2021-03-30 ENCOUNTER — Ambulatory Visit (INDEPENDENT_AMBULATORY_CARE_PROVIDER_SITE_OTHER): Payer: Self-pay | Admitting: Student

## 2021-03-30 ENCOUNTER — Encounter: Payer: Self-pay | Admitting: Student

## 2021-03-30 VITALS — BP 135/75 | HR 89 | Temp 98.2°F | Wt 188.7 lb

## 2021-03-30 DIAGNOSIS — I10 Essential (primary) hypertension: Secondary | ICD-10-CM

## 2021-03-30 DIAGNOSIS — F1190 Opioid use, unspecified, uncomplicated: Secondary | ICD-10-CM

## 2021-03-30 DIAGNOSIS — R21 Rash and other nonspecific skin eruption: Secondary | ICD-10-CM

## 2021-03-30 DIAGNOSIS — M4310 Spondylolisthesis, site unspecified: Secondary | ICD-10-CM

## 2021-03-30 DIAGNOSIS — N400 Enlarged prostate without lower urinary tract symptoms: Secondary | ICD-10-CM

## 2021-03-30 MED ORDER — OXYCODONE-ACETAMINOPHEN 5-325 MG PO TABS: ORAL_TABLET | ORAL | 0 refills | Status: DC

## 2021-03-30 MED ORDER — OXYCODONE-ACETAMINOPHEN 5-325 MG PO TABS: 1.0000 | ORAL_TABLET | Freq: Three times a day (TID) | ORAL | 0 refills | Status: DC | PRN

## 2021-03-30 NOTE — Patient Instructions (Signed)
[name] Acosta,   Thank you for your visit to the [facility] today. It was a pleasure seeing you! Today we discussed the following:  1) Chronic pain - We are checking a urine drug screen today - I have refilled your Percocet  2) Rash on hands - Please try to wear gloves and protect your skin from the sun as much as possible - Schedule a follow-up appointment as soon as you have insurance or the orange card  3) Urinary symptoms - Keep taking your doxazosin  - Let us know if your symptoms worsen - We are checking a PSA level today   Make sure you see our financial counselor on 04/11/21. Schedule a follow-up appointment after you have orange card or insurance. I would like to see you in June. Please bring all of your medications with you.   If you have any questions or concerns, please call our clinic at [phone] between 9am-5pm. Outside of these hours, call [phone] and ask for the internal medicine resident on call. If you feel you are having a medical emergency please call 911.

## 2021-03-30 NOTE — Assessment & Plan Note (Signed)
Patient presents today for follow-up of his chronic medical conditions. BP today 135/75. Patient reports adherence to lisinopril 10 mg daily, however on review of his medication list, it appears lisinopril 20 mg daily was refilled in 12/2020. Patient states his pill bottle says 10 mg daily. I have asked him to report back the dose he is taking. He denies headache, blurry vision, dizziness, lightheadedness.

## 2021-03-30 NOTE — Assessment & Plan Note (Signed)
Patient presents for refill of his chronic opioid therapy for chronic low back pain. Since his last visit earlier this month, he reports stable back pain, manageable on his chronic Percocet 5-325 q8h PRN. He reports some days he takes 3 pills and other days he manages with 1-2 pills. Last UDS was on 09/07/20 and was appropriate.   Plan: - Percocet 5-325 mg q8h PRN refilled for the next 3 months with instructions to pharmacy to not refill until 30 after last fill - UDS today - Consider future referral to PT/OT, PM&R, and/or surgery once patient has orange card or insurance

## 2021-03-30 NOTE — Assessment & Plan Note (Addendum)
Patient reports 1-2 month history of difficulty initiating urination and sensation of incomplete emptying. Denies dysuria, hematuria, flank pain. He reports adherence to doxazosin 8 mg daily. States he has not seen urology in over 10 years.   A/P: Patient with history of BPH on doxazosin 8 mg daily reporting 1-2 month history of LUTS. Discussed importance of behavior modifications including reducing caffeine. As he is uninsured and tolerating his symptoms currently, feel it is safe to hold off on urology referral at this time. Will check a PSA. - PSA --> wnl at 1.5 - continue doxazosin 8 mg - consider referral to urology once insured

## 2021-03-30 NOTE — Assessment & Plan Note (Signed)
Patient's rash appears slightly worse today than earlier this month. Lesions in various stages of healing. As prior notes indicate, still have high suspicion for PCT. Unfortunately patient forgot to bring his reportedly completed insurance paperwork to his appointment today, so referral to dermatology is cost prohibitive at this point.  Plan: - Patient scheduled for financial counseling on 04/11/21 - Instructed him to wear gloves as able, protect his skin - consider repeat HIV and hepatitis testing at future visit as it has been >5 years since last test

## 2021-03-30 NOTE — Progress Notes (Signed)
   CC: chronic pain, rash on hands, urinary symptoms  HPI:  [name] is a 61 y.o. man with history as below who presents to clinic for follow-up of the above chief complaints. His last clinic visit was on 03/03/21.   To see the details of this patient's management of their acute and chronic problems, please refer to the Assessment & Plan under the Encounters tab.    Past Medical History:  Diagnosis Date  . Accidental fall from ladder 12/10/2018  . Acute pain of right shoulder 11/26/2018  . Arthritis   . Bilateral lower extremity edema 06/05/2018  . Chest pain 05/14/2016   Chest pain   . Chronic pain of left ankle 08/01/2011   S/p reconstruction surgery in 1999.   . Dark stools 01/12/2020  . DM (diabetes mellitus) ([place])   . Dyslipidemia 10/18/2006      . Dyspnea on exertion 06/05/2018   Dyspnea on exertion  . Hematuria 08/01/2011   Check UA with next visit. Urine cytology in 2009 and 2012- negative. 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 0.3 mm  renal calculus in lower pole of right kidney right kidney seen in renal ultrasound in 2009. History of renal calculus,s/p lithotripsy in 2002 Cystoscopy in 2006 was negative.    . Hyperlipidemia   . Hypertension   . Prostate disorder   . Renal calculus   . Right hip pain 12/12/2015  . Sleep apnea    Review of Systems:    Review of Systems  Constitutional: Negative for chills, fever and malaise/fatigue.  Eyes: Negative for blurred vision.  Respiratory: Negative for shortness of breath.   Cardiovascular: Negative for chest pain.  Genitourinary: Negative for dysuria, frequency, hematuria and urgency.  Musculoskeletal: Positive for back pain.  Skin: Positive for rash.  Neurological: Negative for dizziness, focal weakness, weakness and headaches.    Physical Exam:  Vitals:   03/30/21 1437  BP: 135/75  Pulse: 89  Temp: 98.2 F (36.8 C)  TempSrc: Oral  SpO2: 99%  Weight: 188 lb 11.2 oz (85.6 kg)   Constitutional:appears older than  stated age, sitting in transport chair, in no acute distress HENT:mucous membranes moist Cardiovascular:regular rate and rhythm, normal heart sounds, no m/r/g, no lower extremity edema Pulmonary/Chest:normal work of breathing on room air, CTAB Skin:Scattered lesions on dorsal aspect of bilateral hands, in various stages of healing; no intact vesicles (see Media tab or below) MSK: point tenderness over lumbar spine      Assessment & Plan:   See Encounters Tab for problem-based charting.  Patient discussed with Dr. Philipp Ovens

## 2021-03-31 LAB — PSA: Prostate Specific Ag, Serum: 1.5 ng/mL (ref 0.0–4.0)

## 2021-03-31 NOTE — Progress Notes (Signed)
Internal Medicine Clinic Attending  Case discussed with Dr. Basaraba  At the time of the visit.  We reviewed the resident's history and exam and pertinent patient test results.  I agree with the assessment, diagnosis, and plan of care documented in the resident's note.  

## 2021-04-01 ENCOUNTER — Other Ambulatory Visit: Payer: Self-pay | Admitting: Internal Medicine

## 2021-04-01 DIAGNOSIS — N400 Enlarged prostate without lower urinary tract symptoms: Secondary | ICD-10-CM

## 2021-04-04 ENCOUNTER — Telehealth: Payer: Self-pay

## 2021-04-04 NOTE — Telephone Encounter (Signed)
Return pt's call - stated when he saw the doctor, she stated will decrease Lisinopril to 10 mg. So when he received last refill, without looking at the bottle, he had taken 20 mg. The pharmacy told him they had not received a new rx from the doctor. Stated he confused; he wants to know should he be on 10 or 20 mg? Thanks

## 2021-04-04 NOTE — Telephone Encounter (Signed)
pls contact pt [phone] regarding medicine

## 2021-04-05 NOTE — Telephone Encounter (Signed)
Per chart review, I would recommend he continue 10 mg and we will recheck his BP at his next visit. Thanks!

## 2021-04-05 NOTE — Telephone Encounter (Signed)
Called / informed pt to continue 10 mg tabs and we will check his BP at his next ov. Voiced understanding; stated he just wanted to be sure.

## 2021-04-06 LAB — TOXASSURE SELECT,+ANTIDEPR,UR

## 2021-04-11 ENCOUNTER — Ambulatory Visit: Payer: Self-pay

## 2021-04-18 ENCOUNTER — Other Ambulatory Visit (HOSPITAL_COMMUNITY): Payer: Self-pay

## 2021-04-18 MED FILL — Sitagliptin-Metformin HCl Tab 50-500 MG: ORAL | 30 days supply | Qty: 30 | Fill #0 | Status: AC

## 2021-05-17 ENCOUNTER — Telehealth: Payer: Self-pay

## 2021-05-17 ENCOUNTER — Other Ambulatory Visit: Payer: Self-pay

## 2021-05-17 ENCOUNTER — Ambulatory Visit (INDEPENDENT_AMBULATORY_CARE_PROVIDER_SITE_OTHER): Payer: Self-pay | Admitting: Internal Medicine

## 2021-05-17 ENCOUNTER — Encounter: Payer: Self-pay | Admitting: Internal Medicine

## 2021-05-17 DIAGNOSIS — S39012D Strain of muscle, fascia and tendon of lower back, subsequent encounter: Secondary | ICD-10-CM

## 2021-05-17 MED ORDER — NAPROXEN 500 MG PO TABS: 500.0000 mg | ORAL_TABLET | Freq: Two times a day (BID) | ORAL | 0 refills | Status: AC

## 2021-05-17 MED ORDER — METHOCARBAMOL 750 MG PO TABS: ORAL_TABLET | ORAL | 0 refills | Status: AC
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
## 2021-05-17 NOTE — Telephone Encounter (Signed)
Returned call to patient. Requesting a med for back pain extending down right leg and some down left leg. States he's been moving a lot of items from one truck to another truck. He is unable to come to clinic 2/2 transportation and also has a lot of items laid out on the ground that he cannot leave. Tele appt given today with [facility] Team.

## 2021-05-17 NOTE — Telephone Encounter (Signed)
Pls contact pt [phone] regarding pain

## 2021-05-17 NOTE — Progress Notes (Signed)
  [facility] Telephone Encounter Continuity Care Appointment  HPI:   This telephone encounter was created for [name] on 05/17/2021 for the following purpose/cc low back pain.   Past Medical History:  Past Medical History:  Diagnosis Date  . Accidental fall from ladder 12/10/2018  . Acute pain of right shoulder 11/26/2018  . Arthritis   . Bilateral lower extremity edema 06/05/2018  . Chest pain 05/14/2016   Chest pain   . Chronic pain of left ankle 08/01/2011   S/p reconstruction surgery in 1999.   . Dark stools 01/12/2020  . DM (diabetes mellitus) ([place])   . Dyslipidemia 10/18/2006      . Dyspnea on exertion 06/05/2018   Dyspnea on exertion  . Hematuria 08/01/2011   Check UA with next visit. Urine cytology in 2009 and 2012- negative. 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 0.3 mm  renal calculus in lower pole of right kidney right kidney seen in renal ultrasound in 2009. History of renal calculus,s/p lithotripsy in 2002 Cystoscopy in 2006 was negative.    . Hyperlipidemia   . Hypertension   . Prostate disorder   . Renal calculus   . Right hip pain 12/12/2015  . Sleep apnea       ROS:  Review of Systems  Constitutional: Negative for chills and fever.  Musculoskeletal: Positive for back pain and myalgias.  Neurological: Negative for sensory change and focal weakness.     Assessment / Plan / Recommendations:   Please see A&P under problem oriented charting for assessment of the patient's acute and chronic medical conditions.   As always, pt is advised that if symptoms worsen or new symptoms arise, they should go to an urgent care facility or to to ER for further evaluation.   Consent and Medical Decision Making:   Patient discussed with Dr. Evette Doffing  This is a telephone encounter between [name] and Lorene Dy on 05/17/2021 for strain of lumbar paraspinal muscle . The visit was conducted with the patient located at home and Lorene Dy at  [facility]. The patient's identity was confirmed using their DOB and current address. The patient has consented to being evaluated through a telephone encounter and understands the associated risks (an examination cannot be done and the patient may need to come in for an appointment) / benefits (allows the patient to remain at home, decreasing exposure to coronavirus). I personally spent 21 minutes on medical discussion.

## 2021-05-18 ENCOUNTER — Other Ambulatory Visit (HOSPITAL_COMMUNITY): Payer: Self-pay

## 2021-05-18 MED FILL — Sitagliptin-Metformin HCl Tab 50-500 MG: ORAL | 30 days supply | Qty: 30 | Fill #1 | Status: AC

## 2021-05-18 NOTE — Progress Notes (Signed)
Internal Medicine Clinic Attending  Case discussed with Dr. Steen  At the time of the visit.  We reviewed the resident's history and exam and pertinent patient test results.  I agree with the assessment, diagnosis, and plan of care documented in the resident's note.  

## 2021-05-24 ENCOUNTER — Telehealth: Payer: Self-pay | Admitting: Student

## 2021-05-24 NOTE — Telephone Encounter (Signed)
Pt requesting a call back to discuss his medications.

## 2021-05-24 NOTE — Telephone Encounter (Signed)
Return pt's call - states his back continues to be in a lot of pain ; from his shoulders to his legs. I asked if he's taking oxycodone and naproxen. He states Oxy does not "work" like it did before and naproxen causes upset stomach. And states he cannot take ibuprofen, robaxin, BC powders,tylenol d/t causing bleeding. Please advise. Thanks

## 2021-05-27 ENCOUNTER — Encounter: Payer: Self-pay | Admitting: *Deleted

## 2021-05-30 NOTE — Telephone Encounter (Signed)
Called pt - informed he needs to schedule an appt per Dr Court Joy. Call transferred to front office - appt schedule 6/10 with Dr Shon Baton.

## 2021-05-30 NOTE — Telephone Encounter (Signed)
Agree, thank you

## 2021-06-09 ENCOUNTER — Encounter: Payer: Self-pay | Admitting: Internal Medicine

## 2021-06-13 NOTE — Progress Notes (Signed)
Office Visit   Patient ID: [name] Acosta, male    DOB: Aug 09, 1960, 61 y.o.   MRN: [id number]   PCP: Alexandria Lodge, MD   Subjective:   CC: back pain, disability paperwork, financial stress  HPI:  [name] Acosta is a 61 y.o. man with history as below who presents to clinic for the above chief complaints. His last clinic visit was 03/30/21, and he had a telehealth appointment on 05/17/21.   To see the details of this patient's management of their acute and chronic problems, please refer to the Assessment & Plan under the Encounters tab.    Review of Systems:   Review of Systems  Constitutional:  Negative for chills, fever, malaise/fatigue and weight loss.  Respiratory:  Negative for shortness of breath.   Cardiovascular:  Negative for chest pain and leg swelling.  Gastrointestinal:  Negative for constipation and diarrhea.  Musculoskeletal:  Positive for back pain. Negative for falls.  Neurological:  Positive for sensory change. Negative for dizziness, focal weakness, weakness and headaches.   Past Medical History:  Diagnosis Date   Accidental fall from ladder 12/10/2018   Acute pain of right shoulder 11/26/2018   Arthritis    Bilateral lower extremity edema 06/05/2018   Chest pain 05/14/2016   Chest pain    Chronic pain of left ankle 08/01/2011   S/p reconstruction surgery in 1999.    Dark stools 01/12/2020   DM (diabetes mellitus) ([place])    Dyslipidemia 10/18/2006       Dyspnea on exertion 06/05/2018   Dyspnea on exertion   Hematuria 08/01/2011   Check UA with next visit. Urine cytology in 2009 and 2012- negative. 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 0.3 mm  renal calculus in lower pole of right kidney right kidney seen in renal ultrasound in 2009. History of renal calculus,s/p lithotripsy in 2002 Cystoscopy in 2006 was negative.     Hyperlipidemia    Hypertension    Prostate disorder    Renal calculus    Right hip pain 12/12/2015   Sleep apnea       ACTIVE MEDICATIONS    Outpatient Medications Prior to Visit  Medication Sig Dispense Refill   Cholecalciferol (VITAMIN D-3) 1000 units CAPS Take 1 capsule by mouth daily.     cyclobenzaprine (FEXMID) 7.5 MG tablet Take 1 tablet (7.5 mg total) by mouth 3 (three) times daily as needed for muscle spasms. 30 tablet 2   doxazosin (CARDURA) 8 MG tablet TAKE 1 TABLET BY MOUTH ONCE A DAY WITH BREAKFAST 90 tablet 1   DULoxetine (CYMBALTA) 30 MG capsule Take 1 capsule (30 mg total) by mouth daily. 30 capsule 0   esomeprazole (NEXIUM) 40 MG capsule Take 40 mg by mouth once a week.     glucose blood (ACCU-CHEK AVIVA) test strip Test blood glucose up to twice daily before meals. The patient is not insulin requiring, ICD 10 code E11.9. 100 each 11   guaifenesin (HUMIBID E) 400 MG TABS tablet Take 400 mg by mouth 2 (two) times daily.     lisinopril (ZESTRIL) 20 MG tablet TAKE 1 TABLET BY MOUTH ONCE A DAY 90 tablet 0   loperamide (IMODIUM) 2 MG capsule Take 2 mg by mouth as needed for diarrhea or loose stools.     loratadine (CLARITIN) 10 MG tablet Take 10 mg by mouth 2 (two) times daily.     nitroGLYCERIN (NITROSTAT) 0.4 MG SL tablet Place 1 tablet (0.4 mg total) under the tongue every 5 (five)  minutes as needed for chest pain. (Patient not taking: Reported on 02/26/2020) 100 tablet 3   pravastatin (PRAVACHOL) 20 MG tablet TAKE 1 TABLET BY MOUTH EVERY NIGHT AT BEDTIME 90 tablet 1   sitaGLIPtin-metformin (JANUMET) 50-500 MG tablet TAKE 1 TABLET BY MOUTH DAILY. 30 tablet 5   SUMAtriptan (IMITREX) 6 MG/0.5ML SOLN injection INJECT 0.5 ML (6 MG TOTAL) INTO THE SKINEVERY 2 HOURS AS NEEDED FOR MIGRAINE OR HEADACHE. MAY REPEAT IN 2 HOURS IF HEADACHE PERSISTS OR RECU (Patient taking differently: as needed. ) 2.5 mL 2   oxyCODONE-acetaminophen (PERCOCET) 5-325 MG tablet Take 1 tablet by mouth every 8 (eight) hours as needed for severe pain. 90 tablet 0   oxyCODONE-acetaminophen (PERCOCET) 5-325 MG tablet Take 1 tablet by mouth every 8 (eight)  hours as needed for severe pain. 90 tablet 0   oxyCODONE-acetaminophen (PERCOCET/ROXICET) 5-325 MG tablet TAKE 1 TABLET BY MOUTH EVERY 8 HOURS AS NEEDED FOR SEVERE PAIN 90 tablet 0   No facility-administered medications prior to visit.    Objective:   BP (!) 139/96 (BP Location: Left Wrist, Patient Position: Sitting, Cuff Size: Normal)   Pulse 97   Temp 98.1 F (36.7 C) (Oral)   Ht _0  (1.676 m)   Wt 194 lb 6.4 oz (88.2 kg)   SpO2 100% Comment: room air  BMI 31.38 kg/m  Wt Readings from Last 3 Encounters:  06/14/21 194 lb 6.4 oz (88.2 kg)  03/30/21 188 lb 11.2 oz (85.6 kg)  03/03/21 188 lb 11.2 oz (85.6 kg)   BP Readings from Last 3 Encounters:  06/14/21 (!) 139/96  03/30/21 135/75  03/03/21 (!) 122/58   Constitutional: appears older than stated age, sitting in transport chair, in no acute distress HENT:  mucous membranes moist Cardiovascular: regular rate and rhythm, normal heart sounds, no m/r/g, no lower extremity edema Pulmonary/Chest: normal work of breathing on room air, CTAB Skin: Scattered lesions on dorsal aspect of bilateral hands, in various stages of healing; no intact vesicles MSK: point tenderness over lumbar spine; attempted to get patient to exam table however transfer limited due to patient's back pain Neuro: sensation intact throughout  Health Maintenance:   Health Maintenance  Topic Date Due   Pneumococcal Vaccine 41-69 Years old (1 - PCV) Never done   Zoster Vaccines- Shingrix (1 of 2) Never done   COLONOSCOPY (Pts 45-30yr Insurance coverage will need to be confirmed)  10/28/2018   OPHTHALMOLOGY EXAM  06/25/2019   FOOT EXAM  03/16/2021   COVID-19 Vaccine (1) 12/11/2021 (Originally 11/19/1965)   INFLUENZA VACCINE  07/31/2021   TETANUS/TDAP  08/23/2021   LIPID PANEL  09/07/2021   HEMOGLOBIN A1C  12/14/2021   PNEUMOCOCCAL POLYSACCHARIDE VACCINE AGE 63-64 HIGH RISK  Completed   Hepatitis C Screening  Completed   HIV Screening  Completed   HPV  VACCINES  Aged Out     Assessment & Plan:   Problem List Items Addressed This Visit       Cardiovascular and Mediastinum   Essential hypertension    Patient's BP is 139/96 today. He denies headache, dizziness, blurry vision, lightheadedness. He did not bring his pill bottle in, however he reports he is taking lisinopril 10 mg daily. Given his ongoing back pain, I will not make changes to his antihypertensive today.          Endocrine   Controlled diabetes mellitus type II without complication (HCC) - Primary    A1c today is 5.5%. A1c 6 months ago was 5.3%.  Patient reports adherence to his Janumet 50-500 mg daily. - continue Janumet 50-500 mg daily - repeat A1c in 6 months (~12/14/21)       Relevant Orders   POC Hbg A1C (Completed)     Musculoskeletal and Integument   Anterolisthesis    [name] Acosta continues to suffer from persistent back pain. He continues on his chronic opioid therapy (Percocet 5-325 q8h PRN) with occasional additional Tylenol. He was seen via telehealth on 05/17/21 and additionally prescribed Robaxin and naproxen, however he reports the Robaxin "did nothing" and he does not take NSAIDs secondary to history of GI bleed. He states he has had occasional, most recently 5 weeks ago, bowel and bladder incontinence and periodic numbness in his L leg. Given the intermittent nature of these symptoms, I do not suspect acute neurologic injury.  His treatment has been limited by his lack of insurance, financial strain, social determinants of health. Our clinic has been working to get him insurance/disability, however due to his life circumstances he has had difficulty closing the loop.   He was previously connected with CCM, last note from 11/2020. I spoke with our CCM social worker, [name], who stopped by the room and met the patient and had him sign a form for disability that has been needing to be signed for some time now.   Plan: - continue Percocet 5-325 1 tablet  q8h PRN - AVOID NSAIDs - CCM social worker [name] met with patient today and will call again tomorrow - Would like to refer patient for PT/OT and/or PM&R vs ortho for additional interventions once he has insurance/orange card       Relevant Medications   oxyCODONE-acetaminophen (PERCOCET/ROXICET) 5-325 MG tablet   Rash and nonspecific skin eruption    This is an ongoing issue. We did not specifically address this today given that patient still does not have insurance or orange card for referral to dermatology. However, his rash is still present, no better, no worse.   Plan: - Instructed patient to wear gloves as able, protect his skin - Referral to dermatology when has insurance/coverage         Other   Uncomplicated opioid use (Chronic)    Patient presents for refill of his chronic opioid therapy for chronic low back pain. Since his last visit, h reports ongoing back pain, manageable on his chronic Percocet 5-325 q8h PRN and occasional additional Tylenol. He reports some days he takes 3 pills and other days he manages with 1-2 pills. Last UDS was on 03/30/21 and was appropriate.   Plan: - Percocet 5-325 mg q8h PRN refilled for the next 3 months with instructions to pharmacy to not refill until 30 after last fill - Consider future referral to PT/OT, PM&R, and/or surgery once patient has orange card or insurance       Relevant Medications   oxyCODONE-acetaminophen (PERCOCET/ROXICET) 5-325 MG tablet     Return in about 3 months (around 09/14/2021).   Pt discussed with Dr. Evette Doffing.  Alexandria Lodge, MD Internal Medicine Resident, PGY-1 [facility] Pager: [phone] 11:57 AM, 06/14/2021

## 2021-06-13 NOTE — Patient Instructions (Addendum)
Mr.Izsak H Weikel,   Thank you for your visit to the [facility] today. It was a pleasure seeing you. Today we discussed the following:  1) Disability, chronic care management - You met our social worker, [name], today. She helped you fill out a form for disability. She will also give you a call tomorrow to talk further.  2) Back pain - See above - We are refilling your Percocet  3) Diabetes: your A1c is great, keep up the good work   It has been an honor being your primary care doctor over the last year. You have been assigned a new PCP, Dr. Benancio Deeds. If you haven't already, you should be receiving a letter in the mail with this information. Please call the clinic on or after July 6 to schedule your next follow-up appointment for 3 months from now. Please bring all of your medications with you!!   If you have any questions or concerns, please call our clinic at [phone] between 9am-5pm. Outside of these hours, call [phone] and ask for the internal medicine resident on call. If you feel you are having a medical emergency please call 911.

## 2021-06-14 ENCOUNTER — Ambulatory Visit (INDEPENDENT_AMBULATORY_CARE_PROVIDER_SITE_OTHER): Payer: Self-pay | Admitting: Student

## 2021-06-14 ENCOUNTER — Encounter: Payer: Self-pay | Admitting: Student

## 2021-06-14 ENCOUNTER — Ambulatory Visit: Payer: Self-pay | Admitting: Licensed Clinical Social Worker

## 2021-06-14 ENCOUNTER — Other Ambulatory Visit: Payer: Self-pay

## 2021-06-14 VITALS — BP 139/96 | HR 97 | Temp 98.1°F | Ht 66.0 in | Wt 194.4 lb

## 2021-06-14 DIAGNOSIS — F1190 Opioid use, unspecified, uncomplicated: Secondary | ICD-10-CM

## 2021-06-14 DIAGNOSIS — E119 Type 2 diabetes mellitus without complications: Secondary | ICD-10-CM

## 2021-06-14 DIAGNOSIS — M4310 Spondylolisthesis, site unspecified: Secondary | ICD-10-CM

## 2021-06-14 DIAGNOSIS — I10 Essential (primary) hypertension: Secondary | ICD-10-CM

## 2021-06-14 DIAGNOSIS — R21 Rash and other nonspecific skin eruption: Secondary | ICD-10-CM

## 2021-06-14 LAB — POCT GLYCOSYLATED HEMOGLOBIN (HGB A1C): Hemoglobin A1C: 5.5 % (ref 4.0–5.6)

## 2021-06-14 LAB — GLUCOSE, CAPILLARY: Glucose-Capillary: 120 mg/dL — ABNORMAL HIGH (ref 70–99)

## 2021-06-14 MED ORDER — OXYCODONE-ACETAMINOPHEN 5-325 MG PO TABS: ORAL_TABLET | ORAL | 0 refills | Status: DC

## 2021-06-14 MED ORDER — OXYCODONE-ACETAMINOPHEN 5-325 MG PO TABS: 1.0000 | ORAL_TABLET | Freq: Three times a day (TID) | ORAL | 0 refills | Status: DC | PRN

## 2021-06-14 NOTE — Assessment & Plan Note (Signed)
This is an ongoing issue. We did not specifically address this today given that patient still does not have insurance or orange card for referral to dermatology. However, his rash is still present, no better, no worse.   Plan: - Instructed patient to wear gloves as able, protect his skin - Referral to dermatology when has insurance/coverage

## 2021-06-14 NOTE — Assessment & Plan Note (Signed)
A1c today is 5.5%. A1c 6 months ago was 5.3%. Patient reports adherence to his Janumet 50-500 mg daily. - continue Janumet 50-500 mg daily - repeat A1c in 6 months (~12/14/21)

## 2021-06-14 NOTE — Progress Notes (Signed)
Internal Medicine Clinic Attending  Case discussed with Dr. Watson  At the time of the visit.  We reviewed the resident's history and exam and pertinent patient test results.  I agree with the assessment, diagnosis, and plan of care documented in the resident's note.  

## 2021-06-14 NOTE — Chronic Care Management (AMB) (Signed)
  Care Management   Social Work Visit Note  06/14/2021 Name: [name] Acosta MRN: [id number] DOB: 05/11/1960  [name] Acosta is a 61 y.o. year old male who sees Alexandria Lodge, MD for primary care. The care management team was consulted for assistance with care management and care coordination needs related to  Disability Application    Patient was given the following information about care management and care coordination services today, agreed to services, and gave verbal consent: 1.care management/care coordination services include personalized support from designated clinical staff supervised by their physician, including individualized plan of care and coordination with other care providers 2. 24/7 contact phone numbers for assistance for urgent and routine care needs. 3. The patient may stop care management/care coordination services at any time by phone call to the office staff.  Engaged with patient face to face for follow up visit in response to provider referral for social work chronic care management and care coordination services.  Assessment: Review of patient history, allergies, and health status during evaluation of patient need for care management/care coordination services.    Interventions:  SW had patient sign "[employer]  Disability Release Of Information Form" while in office with Dr. Shon Baton. SW reviewed chart. Patient was in office with provider. SW unable to complete assessment. SW will follow up with patient to complete assessment. Collaborated with provider office/team re: Disability Application  SDOH (Social Determinants of Health) assessments performed: No     Plan:  patient will work with BSW to address needs related to  Disability Application SW will follow up with patient within the next 30 days.  [name], [place]  Social Worker [facility] Care Management  [phone]

## 2021-06-14 NOTE — Assessment & Plan Note (Addendum)
[name] Acosta continues to suffer from persistent back pain. He continues on his chronic opioid therapy (Percocet 5-325 q8h PRN) with occasional additional Tylenol. He was seen via telehealth on 05/17/21 and additionally prescribed Robaxin and naproxen, however he reports the Robaxin "did nothing" and he does not take NSAIDs secondary to history of GI bleed. He states he has had occasional, most recently 5 weeks ago, bowel and bladder incontinence and periodic numbness in his L leg. Given the intermittent nature of these symptoms, I do not suspect acute neurologic injury.  His treatment has been limited by his lack of insurance, financial strain, social determinants of health. Our clinic has been working to get him insurance/disability, however due to his life circumstances he has had difficulty closing the loop.   He was previously connected with CCM, last note from 11/2020. I spoke with our CCM social worker, [name], who stopped by the room and met the patient and had him sign a form for disability that has been needing to be signed for some time now.   Plan: - continue Percocet 5-325 1 tablet q8h PRN - AVOID NSAIDs - CCM social worker [name] met with patient today and will call again tomorrow - Would like to refer patient for PT/OT and/or PM&R vs ortho for additional interventions once he has insurance/orange card

## 2021-06-14 NOTE — Assessment & Plan Note (Signed)
Patient presents for refill of his chronic opioid therapy for chronic low back pain. Since his last visit, h reports ongoing back pain, manageable on his chronic Percocet 5-325 q8h PRN and occasional additional Tylenol. He reports some days he takes 3 pills and other days he manages with 1-2 pills. Last UDS was on 03/30/21 and was appropriate.  Plan: - Percocet 5-325 mg q8h PRN refilled for the next 3 months with instructions to pharmacy to not refill until 30 after last fill - Consider future referral to PT/OT, PM&R, and/or surgery once patient has orange card or insurance

## 2021-06-14 NOTE — Patient Instructions (Signed)
Visit Information  Instructions: patient will work with SW to address concerns related to Disability Application.  Patient was given the following information about care management and care coordination services today, agreed to services, and gave verbal consent: 1.care management/care coordination services include personalized support from designated clinical staff supervised by their physician, including individualized plan of care and coordination with other care providers 2. 24/7 contact phone numbers for assistance for urgent and routine care needs. 3. The patient may stop care management/care coordination services at any time by phone call to the office staff.  Patient verbalizes understanding of instructions provided today and agrees to view in [place].   Telephone follow up appointment with care management team member scheduled for: 0616/2022   Benno Brensinger, [place] Worker [facility] Care Management  [phone]

## 2021-06-14 NOTE — Assessment & Plan Note (Signed)
Patient's BP is 139/96 today. He denies headache, dizziness, blurry vision, lightheadedness. He did not bring his pill bottle in, however he reports he is taking lisinopril 10 mg daily. Given his ongoing back pain, I will not make changes to his antihypertensive today.

## 2021-06-16 ENCOUNTER — Other Ambulatory Visit (HOSPITAL_COMMUNITY): Payer: Self-pay

## 2021-06-16 ENCOUNTER — Other Ambulatory Visit: Payer: Self-pay | Admitting: Internal Medicine

## 2021-06-16 DIAGNOSIS — S39012D Strain of muscle, fascia and tendon of lower back, subsequent encounter: Secondary | ICD-10-CM

## 2021-06-16 DIAGNOSIS — E119 Type 2 diabetes mellitus without complications: Secondary | ICD-10-CM

## 2021-06-19 ENCOUNTER — Other Ambulatory Visit (HOSPITAL_COMMUNITY): Payer: Self-pay

## 2021-06-19 MED ORDER — JANUMET 50-500 MG PO TABS
1.0000 | ORAL_TABLET | Freq: Every day | ORAL | 5 refills | Status: DC
  Filled 2021-06-19: qty 30, 30d supply, fill #0
  Filled 2021-07-18: qty 30, 30d supply, fill #1
  Filled 2021-08-15: qty 30, 30d supply, fill #2
  Filled 2021-09-18: qty 30, 30d supply, fill #3
  Filled 2021-10-19: qty 30, 30d supply, fill #4
  Filled 2021-11-14: qty 30, 30d supply, fill #5

## 2021-06-19 MED ORDER — JANUMET 50-500 MG PO TABS
1.0000 | ORAL_TABLET | Freq: Every day | ORAL | 5 refills | Status: DC
Start: 2021-06-19 — End: 2021-06-19
  Filled 2021-06-19: qty 30, 30d supply, fill #0

## 2021-06-19 MED ORDER — JANUMET 50-500 MG PO TABS: 1.0000 | ORAL_TABLET | Freq: Every day | ORAL | 5 refills | Status: DC

## 2021-06-19 NOTE — Addendum Note (Signed)
Addended by: Alexandria Lodge on: 06/19/2021 12:43 PM   Modules accepted: Orders

## 2021-06-29 ENCOUNTER — Other Ambulatory Visit: Payer: Self-pay | Admitting: Internal Medicine

## 2021-06-29 DIAGNOSIS — N400 Enlarged prostate without lower urinary tract symptoms: Secondary | ICD-10-CM

## 2021-06-29 MED ORDER — LISINOPRIL 20 MG PO TABS: 20.0000 mg | ORAL_TABLET | Freq: Every day | ORAL | 0 refills | Status: DC

## 2021-06-29 NOTE — Telephone Encounter (Signed)
Pt called he is requesting hislisinopril (ZESTRIL) 20 MG tablet, doxazosin (CARDURA) 8 MG tablet sent to  [place], [place] Phone:  [phone]  Fax:  [phone]

## 2021-07-06 ENCOUNTER — Ambulatory Visit: Payer: Self-pay | Admitting: Licensed Clinical Social Worker

## 2021-07-06 NOTE — Patient Instructions (Signed)
Visit Information  Instructions: patient will work with SW to address concerns related to disability application.    Patient was given the following information about care management and care coordination services today, agreed to services, and gave verbal consent: 1.care management/care coordination services include personalized support from designated clinical staff supervised by their physician, including individualized plan of care and coordination with other care providers 2. 24/7 contact phone numbers for assistance for urgent and routine care needs. 3. The patient may stop care management/care coordination services at any time by phone call to the office staff. Patient verbalizes understanding of instructions provided today and agrees to view in [place].   The care management team will reach out to the patient again over the next 30 days.   [name], [place]  Social Worker [facility] Care Management  [phone]

## 2021-07-06 NOTE — Chronic Care Management (AMB) (Signed)
  Care Management   Social Work Visit Note  07/06/2021 Name: [name] Acosta MRN: [id number] DOB: 1960-04-12  [name] Acosta is a 61 y.o. year old male who sees Timothy Lasso, MD for primary care. The care management team was consulted for assistance with care management and care coordination needs related to Financial Difficulties related to health insurance.    Patient was given the following information about care management and care coordination services today, agreed to services, and gave verbal consent: 1.care management/care coordination services include personalized support from designated clinical staff supervised by their physician, including individualized plan of care and coordination with other care providers 2. 24/7 contact phone numbers for assistance for urgent and routine care needs. 3. The patient may stop care management/care coordination services at any time by phone call to the office staff.  Engaged with patient by telephone for initial visit in response to provider referral for social work chronic care management and care coordination services.  Assessment: Review of patient history, allergies, and health status during evaluation of patient need for care management/care coordination services.    Interventions:  Patient interviewed and appropriate assessments performed Collaborated with clinical team regarding patient needs  SW informed patient that disability application was faxed over to the [facility].  Patient complained of knee pain and requested outreach from [facility]. SW Sent in basket message to [facility]. Patient stated no additional assistance or interventions needed at this time.  SW completed the SDOH screening and didn't determine any additional needs.   SDOH (Social Determinants of Health) assessments performed: Yes SDOH Interventions    Flowsheet Row Most Recent Value  SDOH Interventions   Financial Strain Interventions Other (Comment)  [Patient signed  consent. SW faxed over referral for disability assistance to the [facility].]         Plan:  patient will work with BSW to address needs related to Financial constraints  SW will follow up with patient with in the next 30 days.   [name], [place]  Social Worker [facility] Care Management  [phone]

## 2021-07-11 ENCOUNTER — Other Ambulatory Visit: Payer: Self-pay

## 2021-07-11 ENCOUNTER — Ambulatory Visit (INDEPENDENT_AMBULATORY_CARE_PROVIDER_SITE_OTHER): Payer: Self-pay | Admitting: Internal Medicine

## 2021-07-11 ENCOUNTER — Encounter: Payer: Self-pay | Admitting: Internal Medicine

## 2021-07-11 DIAGNOSIS — R21 Rash and other nonspecific skin eruption: Secondary | ICD-10-CM

## 2021-07-11 NOTE — Assessment & Plan Note (Addendum)
[name] reports skin rash that started at the beginning of summer.  He has a history of the same skin rash 1 year ago during the summer as well.  He said that the blisters and rash are not related to any wounds or cuts or scrapes or burns.  He states that in the wintertime his hands have extreme pain.  Denies any purpleish or black discoloration at the fingertips. 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  He admits to working outdoors often and denies wearing protective clothing.   Porphyrins was checked December 2021 results was elevated at 9.6. 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 He acknowledged that he understood what we discussed and result interpretation.

## 2021-07-11 NOTE — Progress Notes (Signed)
CC: blisters on bilateral hands  HPI:  [name] Acosta is a 61 y.o. male with a past medical history stated below and presents today for blisters on bilateral hands. [name] Acosta complains of blisters on bilateral hands that began this summer.  He reports that this began 1 year ago during the summer as well.  He states that in the wintertime he has extreme pain in the hands but minimal blisters.  He denies the hands being purpleish or black at the fingertips.  He states that the blisters just appear and is not induced by any wounds or scrapes or cuts or burns.  He states that his hands get very itchy and uncomfortable. 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  [name] Acosta admits to working mostly outdoors and constantly exposed to sunlight. He does not wear protective clothing.  He denies the rash appearing anywhere else on his body, only the hands. [name] Acosta has never seen a dermatologist for his symptoms. [name] Acosta admits to an extensive smoking history of 47 years.  He began smoking 3 packs/day and over the course of 20 years he reduced to 2 packs/day. Currently he reports smoking 1 pack/day for the past year and a half.   Please see problem based assessment and plan for additional details.  Past Medical History:  Diagnosis Date   Accidental fall from ladder 12/10/2018   Acute pain of right shoulder 11/26/2018   Arthritis    Bilateral lower extremity edema 06/05/2018   Chest pain 05/14/2016   Chest pain    Chronic pain of left ankle 08/01/2011   S/p reconstruction surgery in 1999.    Dark stools 01/12/2020   DM (diabetes mellitus) ([place])    Dyslipidemia 10/18/2006       Dyspnea on exertion 06/05/2018   Dyspnea on exertion   Hematuria 08/01/2011   Check UA with next visit. Urine cytology in 2009 and 2012- negative. 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 0.3 mm  renal calculus in lower pole of right kidney right kidney seen in renal ultrasound in 2009.  History of renal calculus,s/p lithotripsy in 2002 Cystoscopy in 2006 was negative.     Hyperlipidemia    Hypertension    Prostate disorder    Renal calculus    Right hip pain 12/12/2015   Sleep apnea     Current Outpatient Medications on File Prior to Visit  Medication Sig Dispense Refill   Cholecalciferol (VITAMIN D-3) 1000 units CAPS Take 1 capsule by mouth daily.     cyclobenzaprine (FEXMID) 7.5 MG tablet Take 1 tablet (7.5 mg total) by mouth 3 (three) times daily as needed for muscle spasms. 30 tablet 2   doxazosin (CARDURA) 8 MG tablet TAKE 1 TABLET BY MOUTH ONCE A DAY WITH BREAKFAST 90 tablet 1   DULoxetine (CYMBALTA) 30 MG capsule Take 1 capsule (30 mg total) by mouth daily. 30 capsule 0   esomeprazole (NEXIUM) 40 MG capsule Take 40 mg by mouth once a week.     glucose blood (ACCU-CHEK AVIVA) test strip Test blood glucose up to twice daily before meals. The patient is not insulin requiring, ICD 10 code E11.9. 100 each 11   guaifenesin (HUMIBID E) 400 MG TABS tablet Take 400 mg by mouth 2 (two) times daily.     lisinopril (ZESTRIL) 20 MG tablet Take 1 tablet (20 mg total) by mouth daily. 90 tablet 0   loperamide (IMODIUM) 2 MG capsule Take  2 mg by mouth as needed for diarrhea or loose stools.     loratadine (CLARITIN) 10 MG tablet Take 10 mg by mouth 2 (two) times daily.     nitroGLYCERIN (NITROSTAT) 0.4 MG SL tablet Place 1 tablet (0.4 mg total) under the tongue every 5 (five) minutes as needed for chest pain. (Patient not taking: Reported on 02/26/2020) 100 tablet 3   oxyCODONE-acetaminophen (PERCOCET) 5-325 MG tablet Take 1 tablet by mouth every 8 (eight) hours as needed for severe pain. 90 tablet 0   oxyCODONE-acetaminophen (PERCOCET) 5-325 MG tablet Take 1 tablet by mouth every 8 (eight) hours as needed for severe pain. 90 tablet 0   oxyCODONE-acetaminophen (PERCOCET/ROXICET) 5-325 MG tablet TAKE 1 TABLET BY MOUTH EVERY 8 HOURS AS NEEDED FOR SEVERE PAIN 90 tablet 0   pravastatin  (PRAVACHOL) 20 MG tablet TAKE 1 TABLET BY MOUTH EVERY NIGHT AT BEDTIME 90 tablet 1   sitaGLIPtin-metformin (JANUMET) 50-500 MG tablet Take 1 tablet by mouth daily. 30 tablet 5   SUMAtriptan (IMITREX) 6 MG/0.5ML SOLN injection INJECT 0.5 ML (6 MG TOTAL) INTO THE SKINEVERY 2 HOURS AS NEEDED FOR MIGRAINE OR HEADACHE. MAY REPEAT IN 2 HOURS IF HEADACHE PERSISTS OR RECU (Patient taking differently: as needed. ) 2.5 mL 2   No current facility-administered medications on file prior to visit.    Family History  Problem Relation Age of Onset   Diabetes Mother    Breast cancer Mother    Stroke Sister    Diabetes Maternal Grandmother    Heart disease Maternal Grandmother    Breast cancer Maternal Grandmother     Social History   Socioeconomic History   Marital status: Legally Separated    Spouse name: Not on file   Number of children: 1   Years of education: Not on file   Highest education level: Not on file  Occupational History   Occupation: [employer] tech  Tobacco Use   Smoking status: Every Day    Packs/day: 1.00    Years: 44.00    Pack years: 44.00    Types: Cigarettes   Smokeless tobacco: Never   Tobacco comments:    1pk per day  Vaping Use   Vaping Use: Never used  Substance and Sexual Activity   Alcohol use: No    Alcohol/week: 0.0 standard drinks    Comment: Quit drinking in 1988   Drug use: Not on file   Sexual activity: Not on file  Other Topics Concern   Not on file  Social History Narrative   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[employer], Dentist   Lives by himself   Smoker   No drugs or alcohol   Social Determinants of Health   Financial Resource Strain: High Risk   Difficulty of Paying Living Expenses: Very hard  Food Insecurity: No Food Insecurity   Worried About Charity fundraiser in the Last Year: Never true   Ran Out of Food in the Last Year: Never true  Transportation Needs: No Transportation Needs   Lack of Transportation (Medical): No   Lack of Transportation  (Non-Medical): No  Physical Activity: Not on file  Stress: Stress Concern Present   Feeling of Stress : To some extent  Social Connections: Not on file  Intimate Partner Violence: Not on file    Review of Systems  Constitutional:  Negative for chills and fever.  Respiratory:  Positive for shortness of breath.   Cardiovascular:  Negative for chest pain, palpitations and leg swelling.  Gastrointestinal:  Negative  for constipation, diarrhea, nausea and vomiting.  Skin:  Positive for itching (bilateral hands) and rash (bilateral hands).  Endo/Heme/Allergies:  Does not bruise/bleed easily.   Vitals:   07/11/21 1033 07/11/21 1034  BP:  117/85  Pulse:  96  Temp:  98 F (36.7 C)  TempSrc:  Oral  SpO2:  99%  Weight: 193 lb 11.2 oz (87.9 kg)      Physical Exam: Constitutional: well-appearing, sitting in chair, in no acute distress HENT: normocephalic atraumatic, mucous membranes moist Eyes: conjunctiva non-erythematous Neck: supple Cardiovascular: regular rate and rhythm, no m/r/g Pulmonary/Chest: normal work of breathing on room air, lungs clear to auscultation bilaterally Abdominal: soft, non-tender, non-distended MSK: normal bulk and tone Neurological: alert & oriented x 3, 5/5 strength in bilateral upper and lower extremities, normal gait Skin: warm and dry    Assessment & Plan:   See Encounters Tab for problem based charting.  Patient seen with Dr. Nicholes Rough, M.D. [facility], PGY-1 Pager: [phone], Phone: [phone] Date 07/11/2021 Time 11:18 AM

## 2021-07-12 LAB — CMP14 + ANION GAP
ALT: 52 IU/L — ABNORMAL HIGH (ref 0–44)
AST: 34 IU/L (ref 0–40)
Albumin/Globulin Ratio: 1.7 (ref 1.2–2.2)
Albumin: 4.4 g/dL (ref 3.8–4.9)
Alkaline Phosphatase: 83 IU/L (ref 44–121)
Anion Gap: 18 mmol/L (ref 10.0–18.0)
BUN/Creatinine Ratio: 16 (ref 10–24)
BUN: 14 mg/dL (ref 8–27)
Bilirubin Total: 0.2 mg/dL (ref 0.0–1.2)
CO2: 20 mmol/L (ref 20–29)
Calcium: 9.3 mg/dL (ref 8.6–10.2)
Chloride: 105 mmol/L (ref 96–106)
Creatinine, Ser: 0.88 mg/dL (ref 0.76–1.27)
Globulin, Total: 2.6 g/dL (ref 1.5–4.5)
Glucose: 91 mg/dL (ref 65–99)
Potassium: 4.2 mmol/L (ref 3.5–5.2)
Sodium: 143 mmol/L (ref 134–144)
Total Protein: 7 g/dL (ref 6.0–8.5)
eGFR: 98 mL/min/{1.73_m2} (ref >59)

## 2021-07-12 LAB — C-REACTIVE PROTEIN: CRP: 8 mg/L (ref 0–10)

## 2021-07-12 LAB — SEDIMENTATION RATE: Sed Rate: 14 mm/hr (ref 0–30)

## 2021-07-14 NOTE — Progress Notes (Signed)
Internal Medicine Clinic Attending  I saw and evaluated the patient.  I personally confirmed the key portions of the history and exam documented by Dr.  Jeanice Lim  and I reviewed pertinent patient test results.  The assessment, diagnosis, and plan were formulated together and I agree with the documentation in the resident's note.   Update to PE: Skin exam.  Patients skin is darkened chronically.  He has multiple chronic skin changes.  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  He has a hardened nodule on the left anterior wrist which he notes started as a bulla and then hardened.  He requires further testing to confirm diagnosis of PCT, however, he is currently uninsured and unable to afford further testing at this time.  We discussed him getting the orange card and he was frustrated with this process.

## 2021-07-18 ENCOUNTER — Other Ambulatory Visit (HOSPITAL_COMMUNITY): Payer: Self-pay

## 2021-07-19 ENCOUNTER — Telehealth: Payer: Self-pay

## 2021-07-19 NOTE — Telephone Encounter (Signed)
I called [name] Acosta with his labs results of C-reactive protein, ESR and CMP. He acknowledged understanding what we discussed.

## 2021-07-19 NOTE — Telephone Encounter (Signed)
Pt is requesting a call back he is wanting to know if her lab results from last OV has came back as of yet

## 2021-08-02 ENCOUNTER — Ambulatory Visit: Payer: Self-pay | Admitting: Licensed Clinical Social Worker

## 2021-08-02 NOTE — Chronic Care Management (AMB) (Signed)
  Care Management   Social Work Visit Note  08/02/2021 Name: [name] Acosta MRN: [id number] DOB: November 29, 1960  [name] Acosta is a 61 y.o. year old male who sees [name] Lasso, MD for primary care. The care management team was consulted for assistance with care management and care coordination needs related to Financial Difficulties related to Disability    Patient was given the following information about care management and care coordination services today, agreed to services, and gave verbal consent: 1.care management/care coordination services include personalized support from designated clinical staff supervised by their physician, including individualized plan of care and coordination with other care providers 2. 24/7 contact phone numbers for assistance for urgent and routine care needs. 3. The patient may stop care management/care coordination services at any time by phone call to the office staff.  Engaged with patient by telephone for follow up visit in response to provider referral for social work chronic care management and care coordination services.  Assessment: Review of patient history, allergies, and health status during evaluation of patient need for care management/care coordination services.    Interventions:  Patient interviewed and appropriate assessments performed Collaborated with clinical team regarding patient needs  SW completed multiple phone calls to " [facility]". SW left messages.  Patient advised he hadn't heard back from agency. SW gave patient contact information for agency.  Patient advised he was doing ok, but recently diagnosed with a new allergy. SW inquired if patient needed to speak with the RNCM. Patient declined.  Patient advised he has food, transportation and housing. No other interventions needed.      Plan:  patient will work with BSW to address needs related to Financial constraints  SW will continue diligent efforts for "[employer]  SW will follow up with patient within 30 days.   [name], [place]  Social Worker [facility] Care Management  [phone]

## 2021-08-02 NOTE — Patient Instructions (Signed)
Visit Information  Instructions: patient will work with SW to address concerns related to Disability Application  Patient was given the following information about care management and care coordination services today, agreed to services, and gave verbal consent: 1.care management/care coordination services include personalized support from designated clinical staff supervised by their physician, including individualized plan of care and coordination with other care providers 2. 24/7 contact phone numbers for assistance for urgent and routine care needs. 3. The patient may stop care management/care coordination services at any time by phone call to the office staff.  Patient verbalizes understanding of instructions provided today and agrees to view in [place].   The care management team will reach out to the patient again over the next 30 days.   [name] Acosta, [name] Acosta  Social Worker [facility] Care Management  [phone]

## 2021-08-07 ENCOUNTER — Telehealth: Payer: Self-pay | Admitting: Internal Medicine

## 2021-08-07 NOTE — Telephone Encounter (Signed)
Pt reporting Blisters that have popped up on his right hand.  Pt states he had blisters like this last summer.  Patient states the blisters have bursted all but one.  Please call the patient back.

## 2021-08-07 NOTE — Telephone Encounter (Signed)
Called pt - he has recurring blisters on his hand; first started about 1 week ago. Stated he had them last year. He had mentioned this to his doctor at his 7/12 OV. Stated he has one between th humb and index finger which is raw and tender. He's unable to come in for an in-person appt until Friday. Suggested doing a video telehealth appt -asked if he has a smart phone, stated he does. Appt schedule w/Dr Eulas Post on Tuesday 08/08/21 @ 1530 PM. Informed pt he will receive a text message. Also informed pt the doctor can call before or after schedule times o have his phone close by- stated he understands.

## 2021-08-08 ENCOUNTER — Telehealth (INDEPENDENT_AMBULATORY_CARE_PROVIDER_SITE_OTHER): Payer: Self-pay | Admitting: Student

## 2021-08-08 DIAGNOSIS — R21 Rash and other nonspecific skin eruption: Secondary | ICD-10-CM

## 2021-08-09 NOTE — Progress Notes (Signed)
  [facility] Telephone Encounter Continuity Care Appointment  HPI:  This telephone encounter was created for Mr. MACOY SIXTOS on 08/09/2021 for the following purpose/cc follow up regarding his persistent skin lesion.   Past Medical History:  Past Medical History:  Diagnosis Date   Accidental fall from ladder 12/10/2018   Acute pain of right shoulder 11/26/2018   Arthritis    Bilateral lower extremity edema 06/05/2018   Chest pain 05/14/2016   Chest pain    Chronic pain of left ankle 08/01/2011   S/p reconstruction surgery in 1999.    Dark stools 01/12/2020   DM (diabetes mellitus) ([place])    Dyslipidemia 10/18/2006       Dyspnea on exertion 06/05/2018   Dyspnea on exertion   Hematuria 08/01/2011   Check UA with next visit. Urine cytology in 2009 and 2012- negative. 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 0.3 mm  renal calculus in lower pole of right kidney right kidney seen in renal ultrasound in 2009. History of renal calculus,s/p lithotripsy in 2002 Cystoscopy in 2006 was negative.     Hyperlipidemia    Hypertension    Prostate disorder    Renal calculus    Right hip pain 12/12/2015   Sleep apnea      ROS:     Assessment / Plan / Recommendations:  Please see A&P under problem oriented charting for assessment of the patient's acute and chronic medical conditions.  As always, pt is advised that if symptoms worsen or new symptoms arise, they should go to an urgent care facility or to to ER for further evaluation.   Consent and Medical Decision Making:  Patient discussed with Dr. Daryll Drown This is a telephone encounter between [name] and Rick Duff on 08/09/2021 for follow up of his chronic rash. The visit was conducted with the patient located at home and Rick Duff at [facility]. The patient's identity was confirmed using their DOB and current address. The patient has consented to being evaluated through a telephone encounter and understands the associated  risks (an examination cannot be done and the patient may need to come in for an appointment) / benefits (allows the patient to remain at home, decreasing exposure to coronavirus). I personally spent 20 minutes on medical discussion.

## 2021-08-09 NOTE — Assessment & Plan Note (Signed)
Patient was recently seen by our clinic 7/20, for multiple vesicular eruptions of his hands and forearms that have occurred over the last year.  Patient has had a serum porphyrin obtained in the past which is noted to be elevated.  This was thought to be likely etiology of his symptoms.  Patient has been total multiple times to avoid sun exposure of his arms and hands.  In addition, patient has been told that he needs to get an orange card so he can be referred to dermatology and/or hematology.  On today's televisit patient states that he has filled out this paperwork in the past and he does not wish to fill it out again.  He states that he will have to continue to suffer.  I informed patient that we cannot prescribe any medications or creams until we know exactly what is going on.  I let him know that if he is willing to come to our office that I would help him with filling out this paperwork.  Again the prior visit patient states that he is unwilling to do this.  I reiterated that it is important to get this paperwork completed and when he is ready to do it that someone from our team would be available.

## 2021-08-15 ENCOUNTER — Other Ambulatory Visit (HOSPITAL_COMMUNITY): Payer: Self-pay

## 2021-08-21 NOTE — Progress Notes (Signed)
Internal Medicine Clinic Attending  Case discussed with Dr.  Jeanice Lim   At the time of the visit.  We reviewed the resident's history and pertinent patient test results.  I agree with the assessment, diagnosis, and plan of care documented in the resident's note.

## 2021-09-06 ENCOUNTER — Ambulatory Visit: Payer: Self-pay | Admitting: Licensed Clinical Social Worker

## 2021-09-06 NOTE — Chronic Care Management (AMB) (Signed)
  Care Management   Social Work Visit Note  09/06/2021 Name: [name] Acosta MRN: [id number] DOB: Jan 06, 1960  [name] Acosta is a 61 y.o. year old male who sees Timothy Lasso, MD for primary care. The care management team was consulted for assistance with care management and care coordination needs related to [facility] Resources    Patient was given the following information about care management and care coordination services today, agreed to services, and gave verbal consent: 1.care management/care coordination services include personalized support from designated clinical staff supervised by their physician, including individualized plan of care and coordination with other care providers 2. 24/7 contact phone numbers for assistance for urgent and routine care needs. 3. The patient may stop care management/care coordination services at any time by phone call to the office staff.  Engaged with patient by telephone for follow up visit in response to provider referral for social work chronic care management and care coordination services.  Assessment: Review of patient history, allergies, and health status during evaluation of patient need for care management/care coordination services.    Interventions:  Patient interviewed and appropriate assessments performed Collaborated with clinical team regarding patient needs  Patient is currently experiencing being overwhelmed. Patient is attempting to have a prior friends belonging removed from his property to avoid [facility] seizing his belongings.  At this time, patient isn't interested in pursuing his disability and contacting the [facility].  SW discussed the orange card process. Patient is not wanting to start the process over. SW reviewed the benefits of re-submitting the documents.  SW inquired if patient needs to speak with RNCM and patient declined.  Patient declined any further resources or needs at this time.   SW reviewed chart  for previous orange card application.   SDOH (Social Determinants of Health) assessments performed: Yes     Plan:  patient will work with BSW to address needs related to [employer] will follow up within 30-days.   [name], [place]  Social Worker [facility] Care Management  [phone]

## 2021-09-06 NOTE — Patient Instructions (Signed)
Visit Information  Instructions: patient will work with SW to address concerns related to financial constraints.   Patient was given the following information about care management and care coordination services today, agreed to services, and gave verbal consent: 1.care management/care coordination services include personalized support from designated clinical staff supervised by their physician, including individualized plan of care and coordination with other care providers 2. 24/7 contact phone numbers for assistance for urgent and routine care needs. 3. The patient may stop care management/care coordination services at any time by phone call to the office staff.  Patient verbalizes understanding of instructions provided today and agrees to view in [place].   The care management team will reach out to the patient again over the next 30 days.   [name], [place]  Social Worker [facility] Care Management  [phone]

## 2021-09-18 ENCOUNTER — Other Ambulatory Visit (HOSPITAL_COMMUNITY): Payer: Self-pay

## 2021-09-27 ENCOUNTER — Other Ambulatory Visit: Payer: Self-pay | Admitting: Internal Medicine

## 2021-10-03 ENCOUNTER — Other Ambulatory Visit: Payer: Self-pay | Admitting: Student in an Organized Health Care Education/Training Program

## 2021-10-03 ENCOUNTER — Telehealth: Payer: Self-pay | Admitting: *Deleted

## 2021-10-03 DIAGNOSIS — M4310 Spondylolisthesis, site unspecified: Secondary | ICD-10-CM

## 2021-10-03 NOTE — Telephone Encounter (Signed)
I called [name] today 10/03/2021 concerning his recent lisinopril dose increased from 10 mg to 20 mg.  I informed [name] Acosta that I did not make that dose change.  And I do not see any recent documentation of uncontrolled hypertension.  Patient has been stable and at goal with lisinopril 10 mg.  I suggested to [name] Acosta that I can resend the prescription for 10 mg or he can break the 20 mg pill in half and just take half a pill daily.  Patient states he does not have transportation to go back to the pharmacy, however he is agreeable to break the pill in half.  I informed the patient that if he obtains transportation in the future to the pharmacy I can resend correct prescription of lisinopril 10 mg.  Patient acknowledges and understands.

## 2021-10-03 NOTE — Telephone Encounter (Signed)
Call from pt - states he went to the pharmacy to pick up Lisinopril but the dosage was 20 mg. States he has been taking 10 mg - wants to know the doctor increase the w/o him being aware. Current med list has 20 mg. States he wants to be sure he's taking the correct dosage. Thanks  At 6/15 OV:   Essential hypertension       Patient's BP is 139/96 today. He denies headache, dizziness, blurry vision, lightheadedness. He did not bring his pill bottle in, however he reports he is taking lisinopril 10 mg daily. Given his ongoing back pain, I will not make changes to his antihypertensive today

## 2021-10-09 ENCOUNTER — Other Ambulatory Visit: Payer: Self-pay

## 2021-10-09 DIAGNOSIS — M4310 Spondylolisthesis, site unspecified: Secondary | ICD-10-CM

## 2021-10-09 MED ORDER — OXYCODONE-ACETAMINOPHEN 5-325 MG PO TABS: 1.0000 | ORAL_TABLET | Freq: Three times a day (TID) | ORAL | 0 refills | Status: DC | PRN

## 2021-10-19 ENCOUNTER — Other Ambulatory Visit (HOSPITAL_COMMUNITY): Payer: Self-pay

## 2021-10-30 ENCOUNTER — Encounter: Payer: Self-pay | Admitting: Internal Medicine

## 2021-11-08 ENCOUNTER — Ambulatory Visit (INDEPENDENT_AMBULATORY_CARE_PROVIDER_SITE_OTHER): Payer: Self-pay | Admitting: Student

## 2021-11-08 VITALS — BP 127/68 | HR 76 | Temp 97.9°F

## 2021-11-08 DIAGNOSIS — M4310 Spondylolisthesis, site unspecified: Secondary | ICD-10-CM

## 2021-11-08 DIAGNOSIS — Z0000 Encounter for general adult medical examination without abnormal findings: Secondary | ICD-10-CM

## 2021-11-08 DIAGNOSIS — I10 Essential (primary) hypertension: Secondary | ICD-10-CM

## 2021-11-08 DIAGNOSIS — Z23 Encounter for immunization: Secondary | ICD-10-CM

## 2021-11-08 DIAGNOSIS — R21 Rash and other nonspecific skin eruption: Secondary | ICD-10-CM

## 2021-11-08 DIAGNOSIS — E119 Type 2 diabetes mellitus without complications: Secondary | ICD-10-CM

## 2021-11-08 DIAGNOSIS — F1190 Opioid use, unspecified, uncomplicated: Secondary | ICD-10-CM

## 2021-11-08 LAB — POCT GLYCOSYLATED HEMOGLOBIN (HGB A1C): Hemoglobin A1C: 5.3 % (ref 4.0–5.6)

## 2021-11-08 LAB — GLUCOSE, CAPILLARY: Glucose-Capillary: 135 mg/dL — ABNORMAL HIGH (ref 70–99)

## 2021-11-08 MED ORDER — OXYCODONE-ACETAMINOPHEN 5-325 MG PO TABS: 1.0000 | ORAL_TABLET | Freq: Three times a day (TID) | ORAL | 0 refills | Status: DC | PRN

## 2021-11-08 MED ORDER — LISINOPRIL 10 MG PO TABS: 10.0000 mg | ORAL_TABLET | Freq: Every day | ORAL | 1 refills | Status: DC

## 2021-11-08 NOTE — Assessment & Plan Note (Signed)
Assessment: A1c of 5.3% today.  Stable on current regimen of Janumet 50-500 mg daily.  Plan: -Continue current regimen of Janumet -Repeat A1c in 6 months

## 2021-11-08 NOTE — Progress Notes (Signed)
CC: Chronic Pain  HPI:  [name] is a 61 y.o. male with a past medical history stated below and presents today for discussion of his chronic pain. Please see problem based assessment and plan for additional details.  Past Medical History:  Diagnosis Date   Accidental fall from ladder 12/10/2018   Acute pain of right shoulder 11/26/2018   Arthritis    Bilateral lower extremity edema 06/05/2018   Chest pain 05/14/2016   Chest pain    Chronic pain of left ankle 08/01/2011   S/p reconstruction surgery in 1999.    Dark stools 01/12/2020   DM (diabetes mellitus) ([place])    Dyslipidemia 10/18/2006       Dyspnea on exertion 06/05/2018   Dyspnea on exertion   Hematuria 08/01/2011   Check UA with next visit. Urine cytology in 2009 and 2012- negative. 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 0.3 mm  renal calculus in lower pole of right kidney right kidney seen in renal ultrasound in 2009. History of renal calculus,s/p lithotripsy in 2002 Cystoscopy in 2006 was negative.     Hyperlipidemia    Hypertension    Prostate disorder    Renal calculus    Right hip pain 12/12/2015   Sleep apnea     Current Outpatient Medications on File Prior to Visit  Medication Sig Dispense Refill   Cholecalciferol (VITAMIN D-3) 1000 units CAPS Take 1 capsule by mouth daily.     cyclobenzaprine (FEXMID) 7.5 MG tablet Take 1 tablet (7.5 mg total) by mouth 3 (three) times daily as needed for muscle spasms. 30 tablet 2   doxazosin (CARDURA) 8 MG tablet TAKE 1 TABLET BY MOUTH ONCE A DAY WITH BREAKFAST 90 tablet 1   DULoxetine (CYMBALTA) 30 MG capsule Take 1 capsule (30 mg total) by mouth daily. 30 capsule 0   esomeprazole (NEXIUM) 40 MG capsule Take 40 mg by mouth once a week.     glucose blood (ACCU-CHEK AVIVA) test strip Test blood glucose up to twice daily before meals. The patient is not insulin requiring, ICD 10 code E11.9. 100 each 11   guaifenesin (HUMIBID E) 400 MG TABS tablet Take 400 mg by mouth 2 (two) times  daily.     loperamide (IMODIUM) 2 MG capsule Take 2 mg by mouth as needed for diarrhea or loose stools.     loratadine (CLARITIN) 10 MG tablet Take 10 mg by mouth 2 (two) times daily.     nitroGLYCERIN (NITROSTAT) 0.4 MG SL tablet Place 1 tablet (0.4 mg total) under the tongue every 5 (five) minutes as needed for chest pain. (Patient not taking: Reported on 02/26/2020) 100 tablet 3   oxyCODONE-acetaminophen (PERCOCET) 5-325 MG tablet Take 1 tablet by mouth every 8 (eight) hours as needed for severe pain. 90 tablet 0   oxyCODONE-acetaminophen (PERCOCET) 5-325 MG tablet Take 1 tablet by mouth every 8 (eight) hours as needed for severe pain. 90 tablet 0   oxyCODONE-acetaminophen (PERCOCET/ROXICET) 5-325 MG tablet TAKE 1 TABLET BY MOUTH EVERY 8 HOURS AS NEEDED FOR SEVERE PAIN 90 tablet 0   pravastatin (PRAVACHOL) 20 MG tablet TAKE 1 TABLET BY MOUTH EVERY NIGHT AT BEDTIME 90 tablet 1   sitaGLIPtin-metformin (JANUMET) 50-500 MG tablet Take 1 tablet by mouth daily. 30 tablet 5   SUMAtriptan (IMITREX) 6 MG/0.5ML SOLN injection INJECT 0.5 ML (6 MG TOTAL) INTO THE SKINEVERY 2 HOURS AS NEEDED FOR MIGRAINE OR HEADACHE. MAY REPEAT IN 2 HOURS IF HEADACHE PERSISTS OR RECU (Patient taking differently:  as needed. ) 2.5 mL 2   No current facility-administered medications on file prior to visit.    Family History  Problem Relation Age of Onset   Diabetes Mother    Breast cancer Mother    Stroke Sister    Diabetes Maternal Grandmother    Heart disease Maternal Grandmother    Breast cancer Maternal Grandmother     Social History   Socioeconomic History   Marital status: Legally Separated    Spouse name: Not on file   Number of children: 1   Years of education: Not on file   Highest education level: Not on file  Occupational History   Occupation: [employer] tech  Tobacco Use   Smoking status: Every Day    Packs/day: 1.00    Years: 44.00    Pack years: 44.00    Types: Cigarettes   Smokeless tobacco: Never    Tobacco comments:    1pk per day  Vaping Use   Vaping Use: Never used  Substance and Sexual Activity   Alcohol use: No    Alcohol/week: 0.0 standard drinks    Comment: Quit drinking in 1988   Drug use: Not on file   Sexual activity: Not on file  Other Topics Concern   Not on file  Social History Narrative   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[employer], Dentist   Lives by himself   Smoker   No drugs or alcohol   Social Determinants of Health   Financial Resource Strain: High Risk   Difficulty of Paying Living Expenses: Very hard  Food Insecurity: No Food Insecurity   Worried About Charity fundraiser in the Last Year: Never true   Ran Out of Food in the Last Year: Never true  Transportation Needs: No Transportation Needs   Lack of Transportation (Medical): No   Lack of Transportation (Non-Medical): No  Physical Activity: Not on file  Stress: Stress Concern Present   Feeling of Stress : To some extent  Social Connections: Not on file  Intimate Partner Violence: Not on file    Review of Systems: ROS negative except for what is noted on the assessment and plan.  Vitals:   11/08/21 1020  BP: 127/68  Pulse: 76  Temp: 97.9 F (36.6 C)  TempSrc: Oral  SpO2: 100%     Physical Exam: Constitutional: No acute distress HENT: normocephalic atraumatic Eyes: conjunctiva non-erythematous Neck: supple Cardiovascular: regular rate Pulmonary/Chest: normal work of breathing on room air Abdominal: soft, non-tender, non-distended MSK: normal bulk and tone Neurological: alert & oriented x 3 Skin: warm and dry multiple nontense bullae of left and right upper extremity with 1 hardened bullae on left anterior wrist  Psych: Normal mood and thought process  Patient refused foot exam   Assessment & Plan:   See Encounters Tab for problem based charting.  Patient discussed with Dr. Caffie Damme, D.O. [facility], PGY-2 Pager: [phone], Phone:  [phone] Date 11/08/2021 Time 5:57 PM

## 2021-11-08 NOTE — Assessment & Plan Note (Addendum)
Assessment: Patient's pain is manageable on Percocet 5-325 mg every 8 hours daily.  Patient states these help him function on a day-to-day basis.  PDMP appropriate.  Will refill at this time.  Plan: -Continue Percocet 5-325 mg every 8 hours -U tox pending  Addendum U tox appropriate.

## 2021-11-08 NOTE — Assessment & Plan Note (Signed)
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 Serum porphyrin obtained and elevated.  Patient still has yet to fill paperwork for orange card or A letter so that we will be able to refer him to hematology oncology.  As such we will use online referral, [facility].  Plan: -Send information [facility], pending recommendations of heme-onc

## 2021-11-08 NOTE — Patient Instructions (Signed)
Thank you, Mr.Rome H Blowe for allowing [place] to provide your care today. Today we discussed .  Chronic Pain Please continue to work on getting the orange card and calf, we will continue to work with you and prescribe your chronic pain medications.  We will be checking a urine tox screen today.  High Blood Pressure I have rewritten for your lisinopril to be 10 mg, the appropriate dose.  Please continue to take half of your 20 mg pills until you run out and then pick up your 10 mg pills.  If you have any further questions about this please call our clinic  Diabetes  We will be rechecking her A1c today, in the past you have been stable on your current regimen.  Rash I will be sending an online referral to the specialist and we will keep you updated on what they recommend.  Healthcare maintenance We will be giving you your Tdap booster.  You refused a foot exam today.  If you notice that you have any foot wounds please call clinic immediately.  I have ordered the following labs for you:  Lab Orders         ToxAssure Select,+Antidepr,UR         POC Hbg A1C       Referrals ordered today:   Referral Orders  No referral(s) requested today     I have ordered the following medication/changed the following medications:   Stop the following medications: Medications Discontinued During This Encounter  Medication Reason   lisinopril (ZESTRIL) 20 MG tablet Dose change     Start the following medications: Meds ordered this encounter  Medications   lisinopril (ZESTRIL) 10 MG tablet    Sig: Take 1 tablet (10 mg total) by mouth daily.    Dispense:  90 tablet    Refill:  1     Follow up: 4-6 months     Should you have any questions or concerns please call the internal medicine clinic at [phone].    [name], D.O. [facility]

## 2021-11-08 NOTE — Assessment & Plan Note (Signed)
Patient recently stepped on a nail.  Would not let me look at his feet but denies any wounds being in that area.  He states that it is all healed up.  Patient will receive Tdap today.

## 2021-11-08 NOTE — Assessment & Plan Note (Signed)
Blood pressure well controlled on lisinopril 10 mg daily.  There was confusion over the past few months, patient received lisinopril 20 mg tablets.  Uncertain as to why this occurred but have rewritten the prescription for 10 mg daily.

## 2021-11-09 NOTE — Progress Notes (Signed)
Internal Medicine Clinic Attending  Case discussed with Dr. Katsadouros  At the time of the visit.  We reviewed the resident's history and exam and pertinent patient test results.  I agree with the assessment, diagnosis, and plan of care documented in the resident's note.  

## 2021-11-14 ENCOUNTER — Other Ambulatory Visit (HOSPITAL_COMMUNITY): Payer: Self-pay

## 2021-11-14 LAB — TOXASSURE SELECT,+ANTIDEPR,UR

## 2021-12-08 ENCOUNTER — Other Ambulatory Visit: Payer: Self-pay | Admitting: Student

## 2021-12-08 DIAGNOSIS — M4310 Spondylolisthesis, site unspecified: Secondary | ICD-10-CM

## 2021-12-08 NOTE — Telephone Encounter (Signed)
LOV 11/08/2021.  Last Toxassure-11/08/2021  No pending appointment.

## 2021-12-12 ENCOUNTER — Other Ambulatory Visit (HOSPITAL_COMMUNITY): Payer: Self-pay

## 2021-12-12 ENCOUNTER — Other Ambulatory Visit: Payer: Self-pay | Admitting: Student

## 2021-12-12 DIAGNOSIS — E119 Type 2 diabetes mellitus without complications: Secondary | ICD-10-CM

## 2021-12-12 MED ORDER — JANUMET 50-500 MG PO TABS
1.0000 | ORAL_TABLET | Freq: Every day | ORAL | 5 refills | Status: DC
  Filled 2021-12-12: qty 30, 30d supply, fill #0
  Filled 2022-01-16: qty 30, 30d supply, fill #1
  Filled 2022-02-20: qty 30, 30d supply, fill #2
  Filled 2022-03-20: qty 30, 30d supply, fill #3
  Filled 2022-04-17: qty 30, 30d supply, fill #4
  Filled 2022-05-15: qty 30, 30d supply, fill #5

## 2021-12-12 NOTE — Telephone Encounter (Signed)
Last appointment  was 11/08/2021

## 2021-12-22 ENCOUNTER — Other Ambulatory Visit: Payer: Self-pay | Admitting: Internal Medicine

## 2021-12-22 DIAGNOSIS — N400 Enlarged prostate without lower urinary tract symptoms: Secondary | ICD-10-CM

## 2021-12-26 NOTE — Telephone Encounter (Signed)
doxazosin (CARDURA) 8 MG tablet,  pravastatin (PRAVACHOL) 20 MG tablet, refill request @ [place], [place].

## 2021-12-28 ENCOUNTER — Other Ambulatory Visit: Payer: Self-pay | Admitting: Internal Medicine

## 2022-01-10 ENCOUNTER — Other Ambulatory Visit: Payer: Self-pay | Admitting: Student

## 2022-01-10 DIAGNOSIS — M4310 Spondylolisthesis, site unspecified: Secondary | ICD-10-CM

## 2022-01-10 NOTE — Telephone Encounter (Signed)
Last visit and ToxAssure 11/08/2021.

## 2022-01-16 ENCOUNTER — Other Ambulatory Visit (HOSPITAL_COMMUNITY): Payer: Self-pay

## 2022-01-19 ENCOUNTER — Ambulatory Visit (INDEPENDENT_AMBULATORY_CARE_PROVIDER_SITE_OTHER): Payer: Self-pay | Admitting: Student

## 2022-01-19 ENCOUNTER — Other Ambulatory Visit: Payer: Self-pay

## 2022-01-19 ENCOUNTER — Encounter: Payer: Self-pay | Admitting: Student

## 2022-01-19 VITALS — BP 124/67 | HR 93 | Temp 97.9°F | Ht 68.0 in | Wt 206.9 lb

## 2022-01-19 DIAGNOSIS — R600 Localized edema: Secondary | ICD-10-CM

## 2022-01-19 DIAGNOSIS — M545 Low back pain, unspecified: Secondary | ICD-10-CM

## 2022-01-19 DIAGNOSIS — M170 Bilateral primary osteoarthritis of knee: Secondary | ICD-10-CM

## 2022-01-19 DIAGNOSIS — M4310 Spondylolisthesis, site unspecified: Secondary | ICD-10-CM

## 2022-01-19 DIAGNOSIS — G8929 Other chronic pain: Secondary | ICD-10-CM

## 2022-01-19 DIAGNOSIS — F1190 Opioid use, unspecified, uncomplicated: Secondary | ICD-10-CM

## 2022-01-19 MED ORDER — OXYCODONE-ACETAMINOPHEN 5-325 MG PO TABS: 1.0000 | ORAL_TABLET | Freq: Three times a day (TID) | ORAL | 0 refills | Status: DC | PRN

## 2022-01-19 NOTE — Patient Instructions (Addendum)
We will fill your oxycodone today.   Please call us 1-2 weeks before you need a refill for your prescription.

## 2022-01-22 NOTE — Progress Notes (Signed)
° °  CC: F/u of chronic pain HPI:  Mr.Jaykwon H Trimm is a 62 y.o. M with PMH below who presents for follow up of his chronic pain. Please see problem based charting under encounters tab for further details.    Past Medical History:  Diagnosis Date   Accidental fall from ladder 12/10/2018   Acute pain of right shoulder 11/26/2018   Arthritis    Bilateral lower extremity edema 06/05/2018   Chest pain 05/14/2016   Chest pain    Chronic pain of left ankle 08/01/2011   S/p reconstruction surgery in 1999.    Dark stools 01/12/2020   DM (diabetes mellitus) ([place])    Dyslipidemia 10/18/2006       Dyspnea on exertion 06/05/2018   Dyspnea on exertion   Hematuria 08/01/2011   Check UA with next visit. Urine cytology in 2009 and 2012- negative. 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 0.3 mm  renal calculus in lower pole of right kidney right kidney seen in renal ultrasound in 2009. History of renal calculus,s/p lithotripsy in 2002 Cystoscopy in 2006 was negative.     Hyperlipidemia    Hypertension    Prostate disorder    Renal calculus    Right hip pain 12/12/2015   Sleep apnea    Review of Systems:  Please see problem based charting under encounters tab for further details.    Physical Exam:  Vitals:   01/19/22 0926  BP: 124/67  Pulse: 93  Temp: 97.9 F (36.6 C)  TempSrc: Oral  SpO2: 99%  Weight: 206 lb 14.4 oz (93.8 kg)  Height: 5\' 8"  (1.727 m)    Constitutional: Well-developed and in no distress.  HENT:  Head: Normocephalic and atraumatic.  Eyes: EOM are normal.  Neck: Normal range of motion.  Cardiovascular: Normal rate, regular rhythm, intact distal pulses. No gallop and no friction rub.  No murmur heard. No lower extremity edema  Pulmonary: Non labored breathing on room air, no wheezing or rales  Abdominal: Soft. Normal bowel sounds. Non distended and non tender Musculoskeletal: Normal range of motion.        General: No tenderness or edema.  Neurological: Alert and oriented to  person, place, and time. Non focal  Skin: Skin is warm and dry.    Assessment & Plan:   See Encounters Tab for problem based charting.  Patient discussed with Dr. Philipp Ovens

## 2022-01-23 NOTE — Assessment & Plan Note (Signed)
Patient has had persistent bilateral swelling for multiple years. He had an ECHO 05/2018 which showed an EF of 65-70% with RV size at the upper limit of normal. He is obese and a multi pack year smoker.   A/P: Unclear exact etiology of patient's LE edema patient with risk factors for OSA and R heart dysfunction. He also likely has underlying lung pathology given his smoking history. Patient could also have component of venous insufficiency. On his last CMP he was noted to have normal serum albumin, sCr as well.  -Discussed with patient that he should purchase compression stockings over the counter that go above his knees. These should be worn during the day and taken off at night. Also discussed with him that if he develops wounds on his lower extremities he should ensure that these are well taken care of so they do not worsen. He would not allow complete examination of his legs and feet this clinic visit

## 2022-01-23 NOTE — Assessment & Plan Note (Signed)
Patient presents for follow up of his chronic pain. He has osteoarthritis of multiple joints. He is unable to see an orthopedist at this time due to his insurance status. He continues to need his medication to help him function daily. Upon review of the PDMP he is noted to have appropriate filling of his opioid medicaiton.   Plan:  -Refill percocet 5-325mg  q8h -Obtain tox assure, his last use was the day prior to presenting to clinic

## 2022-01-25 NOTE — Progress Notes (Signed)
Internal Medicine Clinic Attending  Case discussed with Dr. Carter  At the time of the visit.  We reviewed the resident's history and exam and pertinent patient test results.  I agree with the assessment, diagnosis, and plan of care documented in the resident's note.  

## 2022-01-26 LAB — TOXASSURE SELECT,+ANTIDEPR,UR

## 2022-02-09 ENCOUNTER — Other Ambulatory Visit: Payer: Self-pay | Admitting: Student

## 2022-02-09 DIAGNOSIS — I10 Essential (primary) hypertension: Secondary | ICD-10-CM

## 2022-02-19 ENCOUNTER — Ambulatory Visit (INDEPENDENT_AMBULATORY_CARE_PROVIDER_SITE_OTHER): Payer: Self-pay | Admitting: Internal Medicine

## 2022-02-19 ENCOUNTER — Encounter: Payer: Self-pay | Admitting: Internal Medicine

## 2022-02-19 ENCOUNTER — Other Ambulatory Visit: Payer: Self-pay

## 2022-02-19 VITALS — BP 136/73 | HR 94 | Temp 98.0°F | Ht 69.0 in | Wt 204.2 lb

## 2022-02-19 DIAGNOSIS — F1190 Opioid use, unspecified, uncomplicated: Secondary | ICD-10-CM

## 2022-02-19 DIAGNOSIS — G8929 Other chronic pain: Secondary | ICD-10-CM

## 2022-02-19 DIAGNOSIS — M4310 Spondylolisthesis, site unspecified: Secondary | ICD-10-CM

## 2022-02-19 DIAGNOSIS — M545 Low back pain, unspecified: Secondary | ICD-10-CM

## 2022-02-19 DIAGNOSIS — M170 Bilateral primary osteoarthritis of knee: Secondary | ICD-10-CM

## 2022-02-19 DIAGNOSIS — E785 Hyperlipidemia, unspecified: Secondary | ICD-10-CM

## 2022-02-19 DIAGNOSIS — I10 Essential (primary) hypertension: Secondary | ICD-10-CM

## 2022-02-19 MED ORDER — OXYCODONE-ACETAMINOPHEN 7.5-325 MG PO TABS: 1.0000 | ORAL_TABLET | Freq: Three times a day (TID) | ORAL | 0 refills | Status: DC | PRN

## 2022-02-19 MED ORDER — LISINOPRIL 10 MG PO TABS: 10.0000 mg | ORAL_TABLET | Freq: Every day | ORAL | 3 refills | Status: DC

## 2022-02-19 NOTE — Assessment & Plan Note (Addendum)
Patient is on pravastatin 20mg  daily  Plan: Repeat lipid panel at this visit   ADDENDUM: Lipid Panel     Component Value Date/Time   CHOL 171 02/19/2022 0935   TRIG 121 02/19/2022 0935   HDL 42 02/19/2022 0935   CHOLHDL 4.1 02/19/2022 0935   CHOLHDL 6.4 11/02/2013 1617   VLDL 31 11/02/2013 1617   LDLCALC 107 (H) 02/19/2022 0935   LABVLDL 22 02/19/2022 0935   ASCVD risk 18.1%  Plan: Continue pravastatin 20mg  daily

## 2022-02-19 NOTE — Assessment & Plan Note (Signed)
Patient presents for follow up of his chronic pain in setting of multiarticular arthritis. He has been on stable regimen of Percocet 5-325mg  every 8 hours as needed. He notes that although this does allow for daily functioning, pain is not even 50% controlled at this time. He has days where he requires extra dosing for desired pain control to allow him to function. Given that patient has been on this dosing for extended amount of time, suspect that he may have tolerance Urine tox at last visit with cyclobenzaprine and duloxetine. Patient reports that he does not take duloxetine and has not taken this for quite some time as he does not like the effect. He does report intermittently taking flexeril "on his bad days". Patient counseled against this.   Plan: Increase to percocet 7.5-325mg  every 8 hours as needed Follow up in 1 month  If no significant change in symptoms on increased dosing, could change back to percocet 5-325mg  daily

## 2022-02-19 NOTE — Patient Instructions (Addendum)
Mr [name] Acosta,  It was a pleasure seeing you in clinic. Today we discussed:   Pain management:  At this time, I have increased your percocet dosing to 7.5-325mg  every 8 hours as needed. Please let us know if this is too strong for you or if you continue to experience significant pain   Hypertension: I have sent a refill of your lisinopril to your pharmacy  We also checked your cholesterol at this visit. I will call you if any abnormal results   If you have any questions or concerns, please call our clinic at [phone] between 9am-5pm and after hours call [phone] and ask for the internal medicine resident on call. If you feel you are having a medical emergency please call 911.   Thank you, we look forward to helping you remain healthy!

## 2022-02-19 NOTE — Progress Notes (Signed)
° °  CC: pain management  HPI:  [name] is a 62 y.o. male with PMHx as stated below presenting for follow up of pain management in setting of ongoing multiarticular joint pains. Please see problem based charting for complete assessment and plan.  Past Medical History:  Diagnosis Date   Accidental fall from ladder 12/10/2018   Acute pain of right shoulder 11/26/2018   Arthritis    Bilateral lower extremity edema 06/05/2018   Chest pain 05/14/2016   Chest pain    Chronic pain of left ankle 08/01/2011   S/p reconstruction surgery in 1999.    Dark stools 01/12/2020   DM (diabetes mellitus) ([place])    Dyslipidemia 10/18/2006       Dyspnea on exertion 06/05/2018   Dyspnea on exertion   Hematuria 08/01/2011   Check UA with next visit. Urine cytology in 2009 and 2012- negative. 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 0.3 mm  renal calculus in lower pole of right kidney right kidney seen in renal ultrasound in 2009. History of renal calculus,s/p lithotripsy in 2002 Cystoscopy in 2006 was negative.     Hyperlipidemia    Hypertension    Prostate disorder    Renal calculus    Right hip pain 12/12/2015   Sleep apnea    Review of Systems:  Negative except as stated in HPI.  Physical Exam:  Vitals:   02/19/22 0915  BP: 136/73  Pulse: 94  Temp: 98 F (36.7 C)  TempSrc: Oral  SpO2: 98%  Weight: 204 lb 3.2 oz (92.6 kg)  Height: 5\' 9"  (1.753 m)   Physical Exam  Constitutional: Chronically ill appearing elderly male, no acute distress  Cardiovascular: Normal rate, regular rhythm, S1 and S2 present, no murmurs, rubs, gallops.  Distal pulses intact Respiratory:  Lungs are clear to auscultation bilaterally. Musculoskeletal: Normal bulk and tone. Neurological: Is alert and oriented x4, no apparent focal deficits noted. Skin: Warm and dry.  No rash, erythema, lesions noted. Psychiatric: Normal mood and affect.   Assessment & Plan:   See Encounters Tab for problem based charting.  Patient  discussed with Dr. Jimmye Norman

## 2022-02-19 NOTE — Assessment & Plan Note (Signed)
BP Readings from Last 3 Encounters:  02/19/22 136/73  01/19/22 124/67  11/08/21 127/68   Patient is on lisinopril 10mg  daily; however, reports that he has run out over the past 3 days. He is asymptomatic at this time. Blood pressure remains within acceptable range.  Plan: Refill of lisinopril 10mg  daily

## 2022-02-20 ENCOUNTER — Other Ambulatory Visit (HOSPITAL_COMMUNITY): Payer: Self-pay

## 2022-02-20 LAB — LIPID PANEL
Chol/HDL Ratio: 4.1 ratio (ref 0.0–5.0)
Cholesterol, Total: 171 mg/dL (ref 100–199)
HDL: 42 mg/dL (ref >39)
LDL Chol Calc (NIH): 107 mg/dL — ABNORMAL HIGH (ref 0–99)
Triglycerides: 121 mg/dL (ref 0–149)
VLDL Cholesterol Cal: 22 mg/dL (ref 5–40)

## 2022-03-01 NOTE — Progress Notes (Signed)
Internal Medicine Clinic Attending ° °Case discussed with Dr. Aslam  At the time of the visit.  We reviewed the resident’s history and exam and pertinent patient test results.  I agree with the assessment, diagnosis, and plan of care documented in the resident’s note.  °

## 2022-03-12 ENCOUNTER — Ambulatory Visit (INDEPENDENT_AMBULATORY_CARE_PROVIDER_SITE_OTHER): Payer: Self-pay | Admitting: Student

## 2022-03-12 ENCOUNTER — Telehealth: Payer: Self-pay | Admitting: *Deleted

## 2022-03-12 DIAGNOSIS — S91002A Unspecified open wound, left ankle, initial encounter: Secondary | ICD-10-CM

## 2022-03-12 DIAGNOSIS — R21 Rash and other nonspecific skin eruption: Secondary | ICD-10-CM

## 2022-03-12 NOTE — Assessment & Plan Note (Addendum)
Assessment: Mr. Scearce is a gentleman living with well controlled type 2 DM wh endorses persistent lower extremity swelling for the past few months. Prior workups have been unremarkable for determining an etiology (CHF vs hepatic vs venous). He was given a pair of work boots from a friend that zipped on the side. Due to the swelling they were tight and led to a 3x3 inch wound to the left medial aspect of his ankle. Because this was a telehealth appointment I was unable to see the wound myself. He endorsed redness around the area that has not worsened. He denies draining, foul smelling odor, or other symptoms such as fevers or chills. He has been covering it with mepilex dressings and mupirocin creams.  I have an in person appointment with him next week and will be able to examine the wound then. I do not believe he has an infection of his wound or an underlying cellulitis that would warrant oral antibiotics at this time. He has a history ofPatient given strict return precautions to call clinic if wound worsens in redness, has drainage , or develops a foul odor.   Plan: -Conservative management, keep wound clean and cover with bandage -follow up in person next week -if any changes instructed to call clinic and can consider abx at that time.

## 2022-03-12 NOTE — Telephone Encounter (Signed)
Agree with telehealth appointment today. Thank you ?

## 2022-03-12 NOTE — Progress Notes (Signed)
° °  CC: Left ankle wound  This is a telephone encounter between [employer] and [name] Acosta on 03/12/2022 for wound to the left ankle for the past few days. The visit was conducted with the patient located at home and [name]. The patient's identity was confirmed using their DOB and current address. The patient has consented to being evaluated through a telephone encounter and understands the associated risks (an examination cannot be done and the patient may need to come in for an appointment) / benefits (allows the patient to remain at home, decreasing exposure to coronavirus). I personally spent 7 minutes on medical discussion.   HPI:  [name] is a 62 y.o. with PMH as below.   Please see A&P for assessment of the patient's acute and chronic medical conditions.   Past Medical History:  Diagnosis Date   Accidental fall from ladder 12/10/2018   Acute pain of right shoulder 11/26/2018   Arthritis    Bilateral lower extremity edema 06/05/2018   Chest pain 05/14/2016   Chest pain    Chronic pain of left ankle 08/01/2011   S/p reconstruction surgery in 1999.    Dark stools 01/12/2020   DM (diabetes mellitus) ([place])    Dyslipidemia 10/18/2006       Dyspnea on exertion 06/05/2018   Dyspnea on exertion   Hematuria 08/01/2011   Check UA with next visit. Urine cytology in 2009 and 2012- negative. 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 0.3 mm  renal calculus in lower pole of right kidney right kidney seen in renal ultrasound in 2009. History of renal calculus,s/p lithotripsy in 2002 Cystoscopy in 2006 was negative.     Hyperlipidemia    Hypertension    Prostate disorder    Renal calculus    Right hip pain 12/12/2015   Sleep apnea    Review of Systems:   Positive for bilateral lower extremity swelling and left lower leg wound Negative for fever, chills, spreading of redness up his leg, foul smelling odor or drainage from the wound  Assessment & Plan:   No  problem-specific Assessment & Plan notes found for this encounter.   Patient discussed with Dr. Verlee Rossetti Internal Medicine Resident

## 2022-03-12 NOTE — Telephone Encounter (Signed)
Patient called in stating his LLE has been swelling x 2-3 months. Now with wound on left ankle. States he's been keeping it clean, applying antibiotic ointment, and covering with light bandage. Has no transportation at present. He has taken a picture and is trying to upload it to [facility] but it is asking for password. Patient given number to [facility] support [phone]. Patient given tele appt today at 1545. ?

## 2022-03-12 NOTE — Assessment & Plan Note (Addendum)
Update from [facility] dermatologist concerning patient's rash. Recommendations are as follows.  ? ?Screening workup with:  ?1- Urine Porphobilinogen (PBG). This is a spot urine test. ?2- Total plasma porphyrin level.  ? ?Confirmatory tests with: ?1- 24-hour urine for aminolaevulinic acid (ALA) and PBG, preferably during symptoms if able ?2- Total erythrocyte protoporphyrin ? ?His total plasma porphyrin level was elevated at 9.6, we he will need confirmatory testing. Will discuss at follow up visit.  ?

## 2022-03-13 NOTE — Progress Notes (Signed)
Internal Medicine Clinic Attending  Case discussed with Dr. Katsadouros  At the time of the visit.  We reviewed the resident's history and exam and pertinent patient test results.  I agree with the assessment, diagnosis, and plan of care documented in the resident's note.  

## 2022-03-19 ENCOUNTER — Ambulatory Visit (INDEPENDENT_AMBULATORY_CARE_PROVIDER_SITE_OTHER): Payer: Self-pay | Admitting: Student

## 2022-03-19 ENCOUNTER — Encounter: Payer: Self-pay | Admitting: Student

## 2022-03-19 ENCOUNTER — Other Ambulatory Visit (HOSPITAL_COMMUNITY): Payer: Self-pay

## 2022-03-19 ENCOUNTER — Other Ambulatory Visit: Payer: Self-pay

## 2022-03-19 DIAGNOSIS — F1190 Opioid use, unspecified, uncomplicated: Secondary | ICD-10-CM

## 2022-03-19 DIAGNOSIS — R600 Localized edema: Secondary | ICD-10-CM

## 2022-03-19 DIAGNOSIS — S91002A Unspecified open wound, left ankle, initial encounter: Secondary | ICD-10-CM

## 2022-03-19 MED ORDER — DOXYCYCLINE HYCLATE 100 MG PO TABS
100.0000 mg | ORAL_TABLET | Freq: Two times a day (BID) | ORAL | 0 refills | Status: DC
  Filled 2022-03-19: qty 20, 10d supply, fill #0

## 2022-03-19 MED ORDER — OXYCODONE-ACETAMINOPHEN 7.5-325 MG PO TABS: 1.0000 | ORAL_TABLET | Freq: Three times a day (TID) | ORAL | 0 refills | Status: DC | PRN

## 2022-03-19 NOTE — Progress Notes (Signed)
? ?CC: Left Lower Extremity Wound ? ?HPI: ? ?[name] is a 62 y.o. male living with a history stated below and presents today for follow up concerning his lower extremity wound. Please see problem based assessment and plan for additional details. ? ?Past Medical History:  ?Diagnosis Date  ? Accidental fall from ladder 12/10/2018  ? Acute pain of right shoulder 11/26/2018  ? Arthritis   ? Bilateral lower extremity edema 06/05/2018  ? Chest pain 05/14/2016  ? Chest pain   ? Chronic pain of left ankle 08/01/2011  ? S/p reconstruction surgery in 1999.   ? Dark stools 01/12/2020  ? DM (diabetes mellitus) ([place])   ? Dyslipidemia 10/18/2006  ?    ? Dyspnea on exertion 06/05/2018  ? Dyspnea on exertion  ? Hematuria 08/01/2011  ? Check UA with next visit. Urine cytology in 2009 and 2012- negative. 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 0.3 mm  renal calculus in lower pole of right kidney right kidney seen in renal ultrasound in 2009. History of renal calculus,s/p lithotripsy in 2002 Cystoscopy in 2006 was negative.    ? Hyperlipidemia   ? Hypertension   ? Prostate disorder   ? Renal calculus   ? Right hip pain 12/12/2015  ? Sleep apnea   ? ? ?Current Outpatient Medications on File Prior to Visit  ?Medication Sig Dispense Refill  ? Cholecalciferol (VITAMIN D-3) 1000 units CAPS Take 1 capsule by mouth daily.    ? cyclobenzaprine (FEXMID) 7.5 MG tablet Take 1 tablet (7.5 mg total) by mouth 3 (three) times daily as needed for muscle spasms. 30 tablet 2  ? doxazosin (CARDURA) 8 MG tablet TAKE 1 TABLET BY MOUTH ONCE A DAY WITH BREAKFAST 90 tablet 1  ? DULoxetine (CYMBALTA) 30 MG capsule Take 1 capsule (30 mg total) by mouth daily. 30 capsule 0  ? esomeprazole (NEXIUM) 40 MG capsule Take 40 mg by mouth once a week.    ? glucose blood (ACCU-CHEK AVIVA) test strip Test blood glucose up to twice daily before meals. The patient is not insulin requiring, ICD 10 code E11.9. 100 each 11  ? guaifenesin (HUMIBID E) 400 MG TABS tablet Take 400 mg by  mouth 2 (two) times daily.    ? lisinopril (ZESTRIL) 10 MG tablet Take 1 tablet (10 mg total) by mouth daily. 90 tablet 3  ? loperamide (IMODIUM) 2 MG capsule Take 2 mg by mouth as needed for diarrhea or loose stools.    ? loratadine (CLARITIN) 10 MG tablet Take 10 mg by mouth 2 (two) times daily.    ? nitroGLYCERIN (NITROSTAT) 0.4 MG SL tablet Place 1 tablet (0.4 mg total) under the tongue every 5 (five) minutes as needed for chest pain. (Patient not taking: Reported on 02/26/2020) 100 tablet 3  ? pravastatin (PRAVACHOL) 20 MG tablet TAKE 1 TABLET BY MOUTH EVERY NIGHT AT BEDTIME 90 tablet 1  ? sitaGLIPtin-metformin (JANUMET) 50-500 MG tablet Take 1 tablet by mouth daily. 30 tablet 5  ? SUMAtriptan (IMITREX) 6 MG/0.5ML SOLN injection INJECT 0.5 ML (6 MG TOTAL) INTO THE SKINEVERY 2 HOURS AS NEEDED FOR MIGRAINE OR HEADACHE. MAY REPEAT IN 2 HOURS IF HEADACHE PERSISTS OR RECU (Patient taking differently: as needed. ) 2.5 mL 2  ? ?No current facility-administered medications on file prior to visit.  ? ? ?Family History  ?Problem Relation Age of Onset  ? Diabetes Mother   ? Breast cancer Mother   ? Stroke Sister   ? Diabetes Maternal Grandmother   ?  Heart disease Maternal Grandmother   ? Breast cancer Maternal Grandmother   ? ? ?Social History  ? ?Socioeconomic History  ? Marital status: Legally Separated  ?  Spouse name: Not on file  ? Number of children: 1  ? Years of education: Not on file  ? Highest education level: Not on file  ?Occupational History  ? Occupation: Magazine features editor  ?Tobacco Use  ? Smoking status: Every Day  ?  Packs/day: 1.00  ?  Years: 44.00  ?  Pack years: 44.00  ?  Types: Cigarettes  ? Smokeless tobacco: Never  ? Tobacco comments:  ?  1pk per day  ?Vaping Use  ? Vaping Use: Never used  ?Substance and Sexual Activity  ? Alcohol use: No  ?  Alcohol/week: 0.0 standard drinks  ?  Comment: Quit drinking in 1988  ? Drug use: Not on file  ? Sexual activity: Not on file  ?Other Topics Concern  ? Not on file   ?Social History Narrative  ? 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 Lives by himself  ? Smoker  ? No drugs or alcohol  ? ?Social Determinants of Health  ? ?Financial Resource Strain: High Risk  ? Difficulty of Paying Living Expenses: Very hard  ?Food Insecurity: No Food Insecurity  ? Worried About Charity fundraiser in the Last Year: Never true  ? Ran Out of Food in the Last Year: Never true  ?Transportation Needs: No Transportation Needs  ? Lack of Transportation (Medical): No  ? Lack of Transportation (Non-Medical): No  ?Physical Activity: Not on file  ?Stress: Not on file  ?Social Connections: Not on file  ?Intimate Partner Violence: Not on file  ? ? ?Review of Systems: ?ROS negative except for what is noted on the assessment and plan. ? ?Vitals:  ? 03/19/22 0931  ?BP: 137/78  ?Pulse: 92  ?Temp: 97.9 ?F (36.6 ?C)  ?TempSrc: Oral  ?SpO2: 99%  ?Weight: 216 lb 9.6 oz (98.2 kg)  ?Height: '5\' 8"'$  (1.727 m)  ? ? ?Physical Exam: ?Constitutional: mild distress ?HENT: normocephalic atraumatic ?Eyes: conjunctiva non-erythematous ?Neck: supple ?Cardiovascular: regular rate and rhythm, no m/r/g. No JVD. Difficult to assess LE pulses 2/2 swelling. Radial pulses 2+ bilaterally ?Pulmonary/Chest: normal work of breathing on room air, lungs clear to auscultation bilaterally ?Abdominal: soft, non-tender, non-distended. ?MSK: normal bulk and tone. LLE tender to palpate.  ?Neurological: alert & oriented x 3 ?Skin: warm and dry. Blanchable erythema of LLE to knee. 2+ Pitting edema bilaterally with weeping. Taught LLE. Multiple scabbed over wounds (see images below). Anterior mid distal shin wound 1.75x1cm, medial malleolus wound 1.5x2cm with significant surrounding desquamation. Lateral distal left lower extremity wound 2x1.5 cm ?Psych: normal mood and thought process ? ? ? ? ? ? ? ? ? ? ? ? ?Assessment & Plan:  ? ?Ankle wound, left, initial encounter ?Assessment: ?Patient presented to clinic today for follow up concerning his left  lower extremity wound. On presentation he was found to have multiple lower extremity wounds of his left lower extremity. During our phone conversation 6 days ago, he endorsed some redness around the wound but denied drainage, foul smelling odor, or systemic symptoms such as fevers or chills.  ?Today he is endorsing clear drainage from the wound with worsening erythema. He was unaware of the multiple other wounds as he has a difficult time seeing his feet. The pain in the left lower extremity has worsened compared to his baseline lower extremity pain. He denies any fevers  or chills. The wounds themselves appear consistent with venous stasis ulcers. The medial aspect of the left lower extremity just proximal to his ankle has erythema and is slightly raised compared to the rest. The skin around this area appears desquamated. Likely worsening of his venous insufficiency with early infection of the medial ankle wound. He was given multiple dressings as well as 4x4's to dress the wounds. He was instructed to return to the clinic and 1 week to reassess. He was also given a 10 day course of doxycycline. Strict return precautions were discussed and he is to call to be evaluated sooner if any changes occur.  ? ?Low suspicion that this is secondary to a DVT, erythema and tenderness are diffuse. He has no history of PAD, last ABI's without arterial deficiency. I believe he will have ongoing wounds and have difficulty managing these at home. Unfortunately he is also without insurance andt hat limits the resources available for outpatient wound care. We will continue to see him in the clinic and do our best to assist him with these.  ? ?Plan: ?-Wound care instructions given by Dr. Jimmye Norman and staff ?-Doxycyline 100 mg BID for 10 days ?-follow up in clinic in 1 week ? ?Patient seen with Dr. Jimmye Norman ? ?[name], D.O. ?[facility], PGY-2 ?Pager: [phone], Phone: [phone] ?Date 03/19/2022 Time  12:47 PM ? ?

## 2022-03-19 NOTE — Assessment & Plan Note (Signed)
Assessment: ?Patient presented to clinic today for follow up concerning his left lower extremity wound. On presentation he was found to have multiple lower extremity wounds of his left lower extremity. During our phone conversation 6 days ago, he endorsed some redness around the wound but denied drainage, foul smelling odor, or systemic symptoms such as fevers or chills.  ?Today he is endorsing clear drainage from the wound with worsening erythema. He was unaware of the multiple other wounds as he has a difficult time seeing his feet. The pain in the left lower extremity has worsened compared to his baseline lower extremity pain. He denies any fevers or chills. The wounds themselves appear consistent with venous stasis ulcers. The medial aspect of the left lower extremity just proximal to his ankle has erythema and is slightly raised compared to the rest. The skin around this area appears desquamated. Likely worsening of his venous insufficiency with early infection of the medial ankle wound. He was given multiple dressings as well as 4x4's to dress the wounds. He was instructed to return to the clinic and 1 week to reassess. He was also given a 10 day course of doxycycline. Strict return precautions were discussed and he is to call to be evaluated sooner if any changes occur.  ? ?Low suspicion that this is secondary to a DVT, erythema and tenderness are diffuse. He has no history of PAD, last ABI's without arterial deficiency. I believe he will have ongoing wounds and have difficulty managing these at home. Unfortunately he is also without insurance andt hat limits the resources available for outpatient wound care. We will continue to see him in the clinic and do our best to assist him with these.  ? ?Plan: ?-Wound care instructions given by Dr. Jimmye Norman and staff ?-Doxycyline 100 mg BID for 10 days ?-follow up in clinic in 1 week ?

## 2022-03-19 NOTE — Patient Instructions (Signed)
Thank you, [name] for allowing [place] to provide your care today. Today we discussed. ? ?Swollen and infected leg ? ?We will be treating you with antibiotics and sending you home with dressings. You will take the antibiotics twice a day for 10 days. Please try to keep the area clean.  If you notice any worsening I need you to call our clinic immediately. If you notice any fevers or chills call us. Please keep the legs elevated as much as possible. Elevated means try to keep your leg above your heart to help with the fluid movement.  ? ?I would like to see you back in 1 week.  ? ?I have ordered the following labs for you: ? ?Lab Orders  ?No laboratory test(s) ordered today  ?  ? ?Referrals ordered today:  ? ?Referral Orders  ?No referral(s) requested today  ?  ? ?I have ordered the following medication/changed the following medications:  ? ?Stop the following medications: ?There are no discontinued medications.  ? ?Start the following medications: ?Meds ordered this encounter  ?Medications  ? doxycycline (VIBRA-TABS) 100 MG tablet  ?  Sig: Take 1 tablet (100 mg total) by mouth 2 (two) times daily for 10 days.  ?  Dispense:  20 tablet  ?  Refill:  0  ?  [facility]  ?  ? ?Follow up:  1 week follow up   ? ? ?Should you have any questions or concerns please call the internal medicine clinic at [phone].   ? ?[name] Acosta, D.O. ?[place] ?  ?

## 2022-03-20 ENCOUNTER — Other Ambulatory Visit (HOSPITAL_COMMUNITY): Payer: Self-pay

## 2022-03-20 NOTE — Assessment & Plan Note (Signed)
Wounds today seem consistent with venous stasis ulcers, suspect his lower extremity edema is from this. ABI's in the past have been normal. No JVD or pulmonary abnormalities to make me think this is HF related. Will continue to monitor and once wounds are healed, would recommend compression stockings. IF he is able to obtain insurance would recommend following with vascular and wound care.  ?

## 2022-03-20 NOTE — Assessment & Plan Note (Signed)
Refilled his percocet prescribed for his chronic pain. PDMP appropriate.  ?

## 2022-03-26 ENCOUNTER — Encounter: Payer: Self-pay | Admitting: Student

## 2022-03-26 ENCOUNTER — Other Ambulatory Visit: Payer: Self-pay

## 2022-03-26 ENCOUNTER — Ambulatory Visit (INDEPENDENT_AMBULATORY_CARE_PROVIDER_SITE_OTHER): Payer: Self-pay | Admitting: Student

## 2022-03-26 VITALS — BP 131/69 | HR 110 | Temp 98.0°F | Wt 212.1 lb

## 2022-03-26 DIAGNOSIS — S91002A Unspecified open wound, left ankle, initial encounter: Secondary | ICD-10-CM

## 2022-03-26 DIAGNOSIS — R600 Localized edema: Secondary | ICD-10-CM

## 2022-03-26 MED ORDER — DOXYCYCLINE HYCLATE 100 MG PO TABS: 100.0000 mg | ORAL_TABLET | Freq: Two times a day (BID) | ORAL | 0 refills | Status: DC

## 2022-03-26 MED ORDER — DOXYCYCLINE HYCLATE 100 MG PO TABS
100.0000 mg | ORAL_TABLET | Freq: Two times a day (BID) | ORAL | 0 refills | Status: DC
  Filled 2022-03-27: qty 6, 3d supply, fill #0

## 2022-03-26 NOTE — Patient Instructions (Signed)
Thank you, Mr.Chinedum H Leland for allowing [place] to provide your care today. Today we discussed .   ? ?Leg Wound ?Your wound looks much better, we will send in additional antibiotics ? ?Leg Swelling ?Please keep your legs elevated as much as possible and when not elevated, wear your compression stockings ? ?Please follow up in 2-4 weeks and bring in the necessary paperwork to get the orange card and CAFA letter ? ?I have ordered the following labs for you: ? ?Lab Orders  ?No laboratory test(s) ordered today  ?  ? ?Referrals ordered today:  ? ?Referral Orders  ?No referral(s) requested today  ?  ? ?I have ordered the following medication/changed the following medications:  ? ?Stop the following medications: ?There are no discontinued medications.  ? ?Start the following medications: ?No orders of the defined types were placed in this encounter. ?  ? ?Follow up:  2-4 week follow up   ? ? ?Should you have any questions or concerns please call the internal medicine clinic at [phone].   ? ?[name], D.O. ?[facility] ?  ?

## 2022-03-26 NOTE — Assessment & Plan Note (Signed)
Assessment: ?Patient presents to clinic to follow up on venous stasis ulcers of lower extremities. Has had significant improvement in drainage, erythema, and swelling of the lower extremity. He endorses improvement of the pain as well. He is on day 7/10 of his doxycycline BID therapy. Will give three additional days of therapy to complete 14 day course.   ? ?With him being uninsured, these wounds and the chronic lower extremity swelling will continue. Continue to encourage patient to obtain orange card and CAFA letter so that he can then receive wound care and likely vascular surgery referral. Believe he would also benefit from unna boots, ABI's performed in the office were unremarkable. Recommended he start wearing compression stockings daily.  ? ?Plan: ?-extend doxycyline 100 mg BID to 14 day course, currently day 7.  ?-continue to encourage use of compression stockings ?-Unna boots in the future, once insured refer to wound care and vascular surgery for possible surgical intervention.  ?

## 2022-03-26 NOTE — Progress Notes (Signed)
? ?CC: Left lower extremity wounds ? ?HPI: ? ?[name] is a 62 y.o. male living with a history stated below and presents today for lower extremity wound. Please see problem based assessment and plan for additional details. ? ?Past Medical History:  ?Diagnosis Date  ? Accidental fall from ladder 12/10/2018  ? Acute pain of right shoulder 11/26/2018  ? Arthritis   ? Bilateral lower extremity edema 06/05/2018  ? Chest pain 05/14/2016  ? Chest pain   ? Chronic pain of left ankle 08/01/2011  ? S/p reconstruction surgery in 1999.   ? Dark stools 01/12/2020  ? DM (diabetes mellitus) ([place])   ? Dyslipidemia 10/18/2006  ?    ? Dyspnea on exertion 06/05/2018  ? Dyspnea on exertion  ? Hematuria 08/01/2011  ? Check UA with next visit. Urine cytology in 2009 and 2012- negative. 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 0.3 mm  renal calculus in lower pole of right kidney right kidney seen in renal ultrasound in 2009. History of renal calculus,s/p lithotripsy in 2002 Cystoscopy in 2006 was negative.    ? Hyperlipidemia   ? Hypertension   ? Prostate disorder   ? Renal calculus   ? Right hip pain 12/12/2015  ? Sleep apnea   ? ? ?Current Outpatient Medications on File Prior to Visit  ?Medication Sig Dispense Refill  ? Cholecalciferol (VITAMIN D-3) 1000 units CAPS Take 1 capsule by mouth daily.    ? cyclobenzaprine (FEXMID) 7.5 MG tablet Take 1 tablet (7.5 mg total) by mouth 3 (three) times daily as needed for muscle spasms. 30 tablet 2  ? doxazosin (CARDURA) 8 MG tablet TAKE 1 TABLET BY MOUTH ONCE A DAY WITH BREAKFAST 90 tablet 1  ? DULoxetine (CYMBALTA) 30 MG capsule Take 1 capsule (30 mg total) by mouth daily. 30 capsule 0  ? esomeprazole (NEXIUM) 40 MG capsule Take 40 mg by mouth once a week.    ? glucose blood (ACCU-CHEK AVIVA) test strip Test blood glucose up to twice daily before meals. The patient is not insulin requiring, ICD 10 code E11.9. 100 each 11  ? guaifenesin (HUMIBID E) 400 MG TABS tablet Take 400 mg by mouth 2 (two) times  daily.    ? lisinopril (ZESTRIL) 10 MG tablet Take 1 tablet (10 mg total) by mouth daily. 90 tablet 3  ? loperamide (IMODIUM) 2 MG capsule Take 2 mg by mouth as needed for diarrhea or loose stools.    ? loratadine (CLARITIN) 10 MG tablet Take 10 mg by mouth 2 (two) times daily.    ? nitroGLYCERIN (NITROSTAT) 0.4 MG SL tablet Place 1 tablet (0.4 mg total) under the tongue every 5 (five) minutes as needed for chest pain. (Patient not taking: Reported on 02/26/2020) 100 tablet 3  ? oxyCODONE-acetaminophen (PERCOCET) 7.5-325 MG tablet Take 1 tablet by mouth every 8 (eight) hours as needed for severe pain. 90 tablet 0  ? pravastatin (PRAVACHOL) 20 MG tablet TAKE 1 TABLET BY MOUTH EVERY NIGHT AT BEDTIME 90 tablet 1  ? sitaGLIPtin-metformin (JANUMET) 50-500 MG tablet Take 1 tablet by mouth daily. 30 tablet 5  ? SUMAtriptan (IMITREX) 6 MG/0.5ML SOLN injection INJECT 0.5 ML (6 MG TOTAL) INTO THE SKINEVERY 2 HOURS AS NEEDED FOR MIGRAINE OR HEADACHE. MAY REPEAT IN 2 HOURS IF HEADACHE PERSISTS OR RECU (Patient taking differently: as needed. ) 2.5 mL 2  ? ?No current facility-administered medications on file prior to visit.  ? ? ?Family History  ?Problem Relation Age of Onset  ?  Diabetes Mother   ? Breast cancer Mother   ? Stroke Sister   ? Diabetes Maternal Grandmother   ? Heart disease Maternal Grandmother   ? Breast cancer Maternal Grandmother   ? ? ?Social History  ? ?Socioeconomic History  ? Marital status: Legally Separated  ?  Spouse name: Not on file  ? Number of children: 1  ? Years of education: Not on file  ? Highest education level: Not on file  ?Occupational History  ? Occupation: Magazine features editor  ?Tobacco Use  ? Smoking status: Every Day  ?  Packs/day: 1.00  ?  Years: 44.00  ?  Pack years: 44.00  ?  Types: Cigarettes  ? Smokeless tobacco: Never  ? Tobacco comments:  ?  1pk per day  ?Vaping Use  ? Vaping Use: Never used  ?Substance and Sexual Activity  ? Alcohol use: No  ?  Alcohol/week: 0.0 standard drinks  ?  Comment:  Quit drinking in 1988  ? Drug use: Not on file  ? Sexual activity: Not on file  ?Other Topics Concern  ? Not on file  ?Social History Narrative  ? 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 Lives by himself  ? Smoker  ? No drugs or alcohol  ? ?Social Determinants of Health  ? ?Financial Resource Strain: High Risk  ? Difficulty of Paying Living Expenses: Very hard  ?Food Insecurity: No Food Insecurity  ? Worried About Charity fundraiser in the Last Year: Never true  ? Ran Out of Food in the Last Year: Never true  ?Transportation Needs: No Transportation Needs  ? Lack of Transportation (Medical): No  ? Lack of Transportation (Non-Medical): No  ?Physical Activity: Not on file  ?Stress: Not on file  ?Social Connections: Not on file  ?Intimate Partner Violence: Not on file  ? ? ?Review of Systems: ?ROS negative except for what is noted on the assessment and plan. ? ?Vitals:  ? 03/26/22 0954 03/26/22 0956  ?BP:  131/69  ?Pulse:  (!) 110  ?Temp:  98 ?F (36.7 ?C)  ?TempSrc:  Oral  ?SpO2:  100%  ?Weight: 212 lb 1.6 oz (96.2 kg)   ? ? ?Physical Exam: ?Constitutional: no acute distress ?Cardiovascular: regular rate ?Pulmonary/Chest: normal work of breathing on room air ?Abdominal: soft, non-tender, non-distended ?MSK: normal bulk and tone ?Neurological: alert & oriented x 3 ?Skin: warm and dry. Bilateral lower extremity swelling 2+. Multiple healing ulcers. Erythema of wound on medial malleolus.  ?Psych: normal mood and thought process ? ? ? ? ? ? ? ? ?Assessment & Plan:  ? ?Ankle wound, left, initial encounter ?Assessment: ?Patient presents to clinic to follow up on venous stasis ulcers of lower extremities. Has had significant improvement in drainage, erythema, and swelling of the lower extremity. He endorses improvement of the pain as well. He is on day 7/10 of his doxycycline BID therapy. Will give three additional days of therapy to complete 14 day course.   ? ?With him being uninsured, these wounds and the chronic lower  extremity swelling will continue. Continue to encourage patient to obtain orange card and CAFA letter so that he can then receive wound care and likely vascular surgery referral. Believe he would also benefit from unna boots, ABI's performed in the office were unremarkable. Recommended he start wearing compression stockings daily.  ? ?Plan: ?-extend doxycyline 100 mg BID to 14 day course, currently day 7.  ?-continue to encourage use of compression stockings ?-Unna boots in the future, once  insured refer to wound care and vascular surgery for possible surgical intervention.  ? ?Patient discussed with Dr. Dareen Piano ? ?[name], D.O. ?[facility], PGY-2 ?Pager: [phone], Phone: [phone] ?Date 03/26/2022 Time 8:21 PM  ?

## 2022-03-27 ENCOUNTER — Other Ambulatory Visit (HOSPITAL_COMMUNITY): Payer: Self-pay

## 2022-03-28 NOTE — Progress Notes (Signed)
Internal Medicine Clinic Attending  Case discussed with Dr. Katsadouros  At the time of the visit.  We reviewed the resident's history and exam and pertinent patient test results.  I agree with the assessment, diagnosis, and plan of care documented in the resident's note.  

## 2022-04-03 NOTE — Progress Notes (Signed)
Internal Medicine Clinic Attending  I saw and evaluated the patient.  I personally confirmed the key portions of the history and exam documented by Dr. Katsadouros and I reviewed pertinent patient test results.  The assessment, diagnosis, and plan were formulated together and I agree with the documentation in the resident's note.  

## 2022-04-17 ENCOUNTER — Other Ambulatory Visit (HOSPITAL_COMMUNITY): Payer: Self-pay

## 2022-04-17 ENCOUNTER — Telehealth: Payer: Self-pay | Admitting: *Deleted

## 2022-04-17 ENCOUNTER — Other Ambulatory Visit: Payer: Self-pay | Admitting: Student

## 2022-04-17 NOTE — Telephone Encounter (Signed)
Call from pt - stated left leg is swollen and the area at the ankle is healing bur he has a new area on back of the leg. Stated the dressing we put on, when he took it off, it pull off some skin.He does not want to schedule an appt b/c of transportation problems. Stated he getting ready to put on a dry gauze b/c he was told not to use any ointment and cannot find the type of dressing we have. I suggested getting non-adhesive dsg then wrap with gauze (kerlix). And if he can only find dry gauze, moistened it with saline prior to removing it. Stated he's just aggravated; tired of depending on other people, stated he's not depressed. He has an appt on the 24th. Stated he took abx as prescribed and his leg,at first, felt better. Once again he stated he's just aggravated with the situation he's in and not depressed. ?

## 2022-04-18 NOTE — Telephone Encounter (Signed)
Last ToxiSure 01/19/2022.  LV 03/26/2022.  Next visit 04/23/2022. ?

## 2022-04-23 ENCOUNTER — Encounter: Payer: Self-pay | Admitting: Student

## 2022-04-23 ENCOUNTER — Ambulatory Visit (INDEPENDENT_AMBULATORY_CARE_PROVIDER_SITE_OTHER): Payer: Self-pay | Admitting: Student

## 2022-04-23 ENCOUNTER — Other Ambulatory Visit: Payer: Self-pay

## 2022-04-23 DIAGNOSIS — E119 Type 2 diabetes mellitus without complications: Secondary | ICD-10-CM

## 2022-04-23 DIAGNOSIS — I10 Essential (primary) hypertension: Secondary | ICD-10-CM

## 2022-04-23 DIAGNOSIS — S91002A Unspecified open wound, left ankle, initial encounter: Secondary | ICD-10-CM

## 2022-04-23 LAB — POCT GLYCOSYLATED HEMOGLOBIN (HGB A1C): Hemoglobin A1C: 5.9 % — AB (ref 4.0–5.6)

## 2022-04-23 LAB — GLUCOSE, CAPILLARY: Glucose-Capillary: 131 mg/dL — ABNORMAL HIGH (ref 70–99)

## 2022-04-23 NOTE — Progress Notes (Addendum)
? ?CC: Follow-up on ankle wound ? ?HPI: ? ?[name] is a 62 y.o. with past medical history of hypertension, hyperlipidemia, type 2 diabetes who present to the clinic to follow-up on his ankle wound. ? ?Please see problem based charting for detail ? ?Past Medical History:  ?Diagnosis Date  ? Accidental fall from ladder 12/10/2018  ? Acute pain of right shoulder 11/26/2018  ? Arthritis   ? Bilateral lower extremity edema 06/05/2018  ? Chest pain 05/14/2016  ? Chest pain   ? Chronic pain of left ankle 08/01/2011  ? S/p reconstruction surgery in 1999.   ? Dark stools 01/12/2020  ? DM (diabetes mellitus) ([place])   ? Dyslipidemia 10/18/2006  ?    ? Dyspnea on exertion 06/05/2018  ? Dyspnea on exertion  ? Hematuria 08/01/2011  ? Check UA with next visit. Urine cytology in 2009 and 2012- negative. 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 0.3 mm  renal calculus in lower pole of right kidney right kidney seen in renal ultrasound in 2009. History of renal calculus,s/p lithotripsy in 2002 Cystoscopy in 2006 was negative.    ? Hyperlipidemia   ? Hypertension   ? Prostate disorder   ? Renal calculus   ? Right hip pain 12/12/2015  ? Sleep apnea   ? ?Review of Systems:  per HPI ? ?Physical Exam: ? ?There were no vitals filed for this visit. ? ?Physical Exam ?Constitutional:   ?   General: He is not in acute distress. ?   Comments: Chronic ill appearance  ?Eyes:  ?   General:     ?   Right eye: No discharge.     ?   Left eye: No discharge.  ?   Conjunctiva/sclera: Conjunctivae normal.  ?Pulmonary:  ?   Effort: Pulmonary effort is normal.  ?   Breath sounds: Normal breath sounds.  ?Skin: ?   Comments: Persistent wound seen in the posterior left gastrotomies region.  No active purulent discharge.  There was some clear discharge from his lower extremity edema. ?There is a new small about 5 cm in diameter wound seen in the lateral left ankle.  No purulent discharge from the wound.  ?Neurological:  ?   Mental Status: He is alert.  ?Psychiatric:      ?   Mood and Affect: Mood normal.  ?  ? ? ? ? ? ? ? ?Assessment & Plan:  ? ?See Encounters Tab for problem based charting. ? ?Ankle wound, left, initial encounter ?Patient is here for 4-week follow-up for his ankle wound.  He just recently finished 14 days of doxycycline course.  He denies purulent discharge, fever or chills.  He cannot tolerate compression socks with the pain.  Currently wear regular high socks up to his bilateral knees. ? ?The wound seen in the posterior left gastrotomies region appears in healing state.  No active purulent discharge.  There was some clear discharge from his lower extremity edema. ? ?There is a new small about 5 cm in diameter wound seen in the lateral left ankle.  No purulent discharge from the wound.  He contributed this wound to his tennis shoe rubbing on this area.  Said that he only has 2 pairs of shoes.  He wants to wear his work boots but cannot due to the pain.  States that he has to wear this pair tennis shoes when he goes out.   ? ?The wound overall does not appear actively infected.  I am concerned about the  new wound causing from shoes irritation.  Advised patient to try different footwear that does not rub on this area such as slip on flip-flop but he declined. ? ?Also advised patient to keep the wound as clean and dry as he can.  He changes his bandage every other day.  His neighbor also comes by sometimes to help.  I am also concerned about his ability to keep a good hygiene on this wound.  Said that he will try to soak his leg in Epsom salt and rubbing alcohol.  I strongly advised against it but it seems that he might do it. ? ?Patient does not have have orange card or CAFA letter for wound care center referral.  He does not want to reapply for those.  He also declined my offer to reaching out to [employer] officer, [employer] for English as a second language teacher.  He does not want anybody to come by his house.  He will try to come back in 1 to 2 weeks for [place] to reevaluate the wound.   ? ? ? ? ?Essential hypertension ?Blood pressure well controlled 131/73. ? ?-Continue lisinopril 10 mg daily ? ?Controlled diabetes mellitus type II without complication ([place]) ?A1c 5.9 today.  Report adherence to Janumet ? ?-Continue Janumet 50-500 mg daily. ?-We will keep his diabetes under control to avoid worsening of his ankle wound  ? ?Patient discussed with Dr. Dareen Piano  ?

## 2022-04-23 NOTE — Assessment & Plan Note (Addendum)
Patient is here for 4-week follow-up for his ankle wound.  He just recently finished 14 days of doxycycline course.  He denies purulent discharge, fever or chills.  He cannot tolerate compression socks with the pain.  Currently wear regular high socks up to his bilateral knees. ? ?The wound seen in the posterior left gastrotomies region appears in healing state.  No active purulent discharge.  There was some clear discharge from his lower extremity edema. ? ?There is a new small about 5 cm in diameter wound seen in the lateral left ankle.  No purulent discharge from the wound.  He contributed this wound to his tennis shoe rubbing on this area.  Said that he only has 2 pairs of shoes.  He wants to wear his work boots but cannot due to the pain.  States that he has to wear this pair tennis shoes when he goes out.   ? ?The wound overall does not appear actively infected.  I am concerned about the new wound causing from shoes irritation.  Advised patient to try different footwear that does not rub on this area such as slip on flip-flop but he declined. ? ?Also advised patient to keep the wound as clean and dry as he can.  He changes his bandage every other day.  His neighbor also comes by sometimes to help.  I am also concerned about his ability to keep a good hygiene on this wound.  Said that he will try to soak his leg in Epsom salt and rubbing alcohol.  I strongly advised against it but it seems that he might do it. ? ?Patient does not have have orange card or CAFA letter for wound care center referral.  He does not want to reapply for those.  He also declined my offer to reaching out to Education officer, [employer] for English as a second language teacher.  He does not want anybody to come by his house.  He will try to come back in 1 to 2 weeks for [place] to reevaluate the wound.  ? ? ? ?

## 2022-04-23 NOTE — Patient Instructions (Addendum)
[name] Acosta, ? ?It was nice seeing you in the clinic today.  Your back wound has been healing I am concerned about the new wound caused by your tennis shoe. ? ?Please try a different pair of shoes that does not irritate the area.  I would not soak your leg in Epsom salt or rubbing alcohol. ? ?Patient to keep the wound as dry as you can and change the bandage as often as you can. ? ?We will see you back in 1 - 2 weeks to make sure the wound is not infected ? ?Take care ? ?Dr. Alfonse Spruce ?

## 2022-04-23 NOTE — Assessment & Plan Note (Signed)
A1c 5.9 today.  Report adherence to Janumet ? ?-Continue Janumet 50-500 mg daily. ?-We will keep his diabetes under control to avoid worsening of his ankle wound ?

## 2022-04-23 NOTE — Assessment & Plan Note (Signed)
Blood pressure well controlled 131/73. ? ?-Continue lisinopril 10 mg daily ?

## 2022-04-25 NOTE — Progress Notes (Signed)
Internal Medicine Clinic Attending  Case discussed with Dr. Nguyen  At the time of the visit.  We reviewed the resident's history and exam and pertinent patient test results.  I agree with the assessment, diagnosis, and plan of care documented in the resident's note. 

## 2022-05-15 ENCOUNTER — Other Ambulatory Visit (HOSPITAL_COMMUNITY): Payer: Self-pay

## 2022-05-17 ENCOUNTER — Telehealth: Payer: Self-pay

## 2022-05-17 ENCOUNTER — Other Ambulatory Visit: Payer: Self-pay | Admitting: Internal Medicine

## 2022-05-17 NOTE — Telephone Encounter (Signed)
oxyCODONE-acetaminophen (PERCOCET)  [place], [place] [address]  [place], [place] [address]  Phone:  [phone]  Fax:  [phone]

## 2022-05-17 NOTE — Telephone Encounter (Signed)
Last rx written  04/18/22. Last OV 04/23/22. Next OV - has not been scheduled. UDS  01/19/22.

## 2022-05-17 NOTE — Telephone Encounter (Signed)
Refill has already been requested via surescript.

## 2022-05-30 ENCOUNTER — Telehealth: Payer: Self-pay | Admitting: *Deleted

## 2022-05-30 ENCOUNTER — Ambulatory Visit (INDEPENDENT_AMBULATORY_CARE_PROVIDER_SITE_OTHER): Payer: Self-pay | Admitting: Internal Medicine

## 2022-05-30 DIAGNOSIS — S91002A Unspecified open wound, left ankle, initial encounter: Secondary | ICD-10-CM

## 2022-05-30 NOTE — Progress Notes (Signed)
  [facility] Telephone Encounter Continuity Care Appointment  HPI:  This telephone encounter was created for [name] on 05/30/2022 for the following purpose/cc leg wound.   Past Medical History:  Past Medical History:  Diagnosis Date   Accidental fall from ladder 12/10/2018   Acute pain of right shoulder 11/26/2018   Arthritis    Bilateral lower extremity edema 06/05/2018   Chest pain 05/14/2016   Chest pain    Chronic pain of left ankle 08/01/2011   S/p reconstruction surgery in 1999.    Dark stools 01/12/2020   DM (diabetes mellitus) ([place])    Dyslipidemia 10/18/2006       Dyspnea on exertion 06/05/2018   Dyspnea on exertion   Hematuria 08/01/2011   Check UA with next visit. Urine cytology in 2009 and 2012- negative. 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 0.3 mm  renal calculus in lower pole of right kidney right kidney seen in renal ultrasound in 2009. History of renal calculus,s/p lithotripsy in 2002 Cystoscopy in 2006 was negative.     Hyperlipidemia    Hypertension    Prostate disorder    Renal calculus    Right hip pain 12/12/2015   Sleep apnea      ROS:  Positive for leg wound, pain, drainage, negative for fever, chills, chest pain   Assessment / Plan / Recommendations:  Please see A&P under problem oriented charting for assessment of the patient's acute and chronic medical conditions.  As always, pt is advised that if symptoms worsen or new symptoms arise, they should go to an urgent care facility or to to ER for further evaluation.   Consent and Medical Decision Making:  Patient discussed with Dr. Dareen Piano This is a telephone encounter between [name] Acosta and [name] Acosta on 05/30/2022 for leg wound. The visit was conducted with the patient located at home and [facility] at [facility]. The patient's identity was confirmed using their DOB and current address. The patient has consented to being evaluated through a telephone encounter and understands  the associated risks (an examination cannot be done and the patient may need to come in for an appointment) / benefits (allows the patient to remain at home, decreasing exposure to coronavirus). I personally spent 11 minutes on medical discussion.

## 2022-05-30 NOTE — Assessment & Plan Note (Signed)
Patient called in to discuss leg wound today. He is unhappy with the care he has received here and at the hospital in the past. He says that he does not trust anyone and feels like if he ends up in the hospital people are going to take "everything that he worked hard to earn". He has been offered orange card paperwork, social work referral, etc in the past but he says that he filled out the orange card form two years ago and that we lost it and told him it was out of date which he says is totally and completely false. Discussed with patient that the orange card was something that needed to be reapplied for. Without the orange card it would be expensive for him to see wound care. However he is unhappy with his experience in the clinic during his last visit and says that a terrible job was done wrapping his leg. He says having nothing done to it would have been better than having it wrapped. His plan right now is to go to the wound care center and see how expensive it is. If he is unable to afford it he might then consider reapplying for the orange card but right now he feels like it would not do any good since it didn't help him last time.   Discussed that I am worried about how his leg looks and that ideally it would be evaluated in person by a medical professional whether that was the wound care center, our clinic, an urgent care, or the ER because if he was developing signs of infection it could be life threatening if not treated. He is understanding and says that he will try to get seen at wound care. Discussed return precautions or urgent reasons to go to the ER including fever, chills, increasing redness, increasing wound drainage, foul odor, or nausea and vomiting. Patient understanding. - wound care referral placed - continue offering orange card paperwork and social work referrals

## 2022-05-30 NOTE — Telephone Encounter (Signed)
Call from patient c/o of wound on left ankle. Has had for a while. Continues to drain clear to blood tinged mucous.  Is painful has to take pain meds for.  Soaks in water and diluted Clorox water and redresses everyday.  Would like to get referral to [facility] as wound has worsened and painful.  Unable to come in for an appointment but stated when asked that he will arrange to get to the [facility].  Site per patient is dark in color.  Patient given TeleHealth appointment to discuss referral.

## 2022-05-30 NOTE — Telephone Encounter (Signed)
I agree

## 2022-05-31 ENCOUNTER — Other Ambulatory Visit: Payer: Self-pay | Admitting: Internal Medicine

## 2022-05-31 DIAGNOSIS — N400 Enlarged prostate without lower urinary tract symptoms: Secondary | ICD-10-CM

## 2022-05-31 NOTE — Telephone Encounter (Signed)
Pt calling to f/u with his medication refill  doxazosin (CARDURA) 8 MG tablet  [facility] - [facility] [place], [place] - [place]

## 2022-06-07 NOTE — Progress Notes (Signed)
Internal Medicine Clinic Attending ° °Case discussed with Dr. DeMaio  At the time of the visit.  We reviewed the resident’s history and exam and pertinent patient test results.  I agree with the assessment, diagnosis, and plan of care documented in the resident’s note. ° ° °

## 2022-06-18 ENCOUNTER — Other Ambulatory Visit (HOSPITAL_COMMUNITY): Payer: Self-pay

## 2022-06-18 ENCOUNTER — Other Ambulatory Visit: Payer: Self-pay | Admitting: Student

## 2022-06-18 ENCOUNTER — Other Ambulatory Visit: Payer: Self-pay | Admitting: Internal Medicine

## 2022-06-18 DIAGNOSIS — E119 Type 2 diabetes mellitus without complications: Secondary | ICD-10-CM

## 2022-06-18 NOTE — Telephone Encounter (Signed)
Last rx written 05/17/22. Last OV  05/30/22. Next OV - has not been scheduled. UDS  01/19/22.

## 2022-06-19 MED ORDER — JANUMET 50-500 MG PO TABS
1.0000 | ORAL_TABLET | Freq: Every day | ORAL | 5 refills | Status: DC
  Filled 2022-06-19: qty 30, 30d supply, fill #0
  Filled 2022-07-16 (×2): qty 30, 30d supply, fill #1
  Filled 2022-08-15: qty 30, 30d supply, fill #2
  Filled 2022-09-13: qty 30, 30d supply, fill #3
  Filled 2022-10-12: qty 30, 30d supply, fill #4
  Filled 2022-11-12: qty 30, 30d supply, fill #5

## 2022-06-20 ENCOUNTER — Other Ambulatory Visit (HOSPITAL_COMMUNITY): Payer: Self-pay

## 2022-06-20 ENCOUNTER — Other Ambulatory Visit: Payer: Self-pay | Admitting: Internal Medicine

## 2022-07-16 ENCOUNTER — Other Ambulatory Visit (HOSPITAL_COMMUNITY): Payer: Self-pay

## 2022-07-19 ENCOUNTER — Other Ambulatory Visit: Payer: Self-pay | Admitting: Internal Medicine

## 2022-07-19 NOTE — Telephone Encounter (Signed)
Last ToxAssure 01/19/2022.  Last appointment 05/30/2022.  No future appointments scheduled.

## 2022-07-19 NOTE — Telephone Encounter (Signed)
oxyCODONE-acetaminophen (PERCOCET) 7.5-325 MG tablet, REFILL REQUEST @ [place], [place] - [place].

## 2022-08-15 ENCOUNTER — Other Ambulatory Visit (HOSPITAL_COMMUNITY): Payer: Self-pay

## 2022-08-16 ENCOUNTER — Other Ambulatory Visit (HOSPITAL_COMMUNITY): Payer: Self-pay

## 2022-08-21 ENCOUNTER — Other Ambulatory Visit: Payer: Self-pay | Admitting: Student

## 2022-08-21 NOTE — Telephone Encounter (Signed)
Last rx written 07/19/22. Last OV 05/30/22. Next OV - has not been scheduled. [place] 01/19/22.

## 2022-09-13 ENCOUNTER — Other Ambulatory Visit (HOSPITAL_COMMUNITY): Payer: Self-pay

## 2022-09-24 ENCOUNTER — Other Ambulatory Visit: Payer: Self-pay | Admitting: Internal Medicine

## 2022-09-24 NOTE — Telephone Encounter (Signed)
Last rx written  08/21/22. Last OV 05/30/22. Next OV has not been scheduled. Tox  01/19/22.

## 2022-09-25 ENCOUNTER — Telehealth: Payer: Self-pay

## 2022-09-25 NOTE — Telephone Encounter (Signed)
oxyCODONE-acetaminophen (PERCOCET) 7.5-325 MG tablet, refill request@ [facility], [place] - [place].

## 2022-09-25 NOTE — Telephone Encounter (Signed)
This Rx was sent to requested Pharmacy on 09/24/22.

## 2022-10-12 ENCOUNTER — Other Ambulatory Visit (HOSPITAL_COMMUNITY): Payer: Self-pay

## 2022-10-24 ENCOUNTER — Ambulatory Visit (INDEPENDENT_AMBULATORY_CARE_PROVIDER_SITE_OTHER): Payer: Self-pay | Admitting: Student

## 2022-10-24 ENCOUNTER — Encounter: Payer: Self-pay | Admitting: Dietician

## 2022-10-24 ENCOUNTER — Encounter: Payer: Self-pay | Admitting: Student

## 2022-10-24 VITALS — BP 115/74 | HR 93 | Temp 97.6°F | Ht 68.0 in | Wt 206.5 lb

## 2022-10-24 DIAGNOSIS — E119 Type 2 diabetes mellitus without complications: Secondary | ICD-10-CM

## 2022-10-24 DIAGNOSIS — G8929 Other chronic pain: Secondary | ICD-10-CM

## 2022-10-24 DIAGNOSIS — M545 Low back pain, unspecified: Secondary | ICD-10-CM

## 2022-10-24 DIAGNOSIS — I10 Essential (primary) hypertension: Secondary | ICD-10-CM

## 2022-10-24 DIAGNOSIS — S91002A Unspecified open wound, left ankle, initial encounter: Secondary | ICD-10-CM

## 2022-10-24 DIAGNOSIS — E785 Hyperlipidemia, unspecified: Secondary | ICD-10-CM

## 2022-10-24 LAB — POCT GLYCOSYLATED HEMOGLOBIN (HGB A1C): Hemoglobin A1C: 5.6 % (ref 4.0–5.6)

## 2022-10-24 LAB — GLUCOSE, CAPILLARY: Glucose-Capillary: 126 mg/dL — ABNORMAL HIGH (ref 70–99)

## 2022-10-24 MED ORDER — OXYCODONE-ACETAMINOPHEN 7.5-325 MG PO TABS: ORAL_TABLET | ORAL | 0 refills | Status: DC

## 2022-10-24 MED ORDER — LISINOPRIL 10 MG PO TABS: 10.0000 mg | ORAL_TABLET | Freq: Every day | ORAL | 3 refills | Status: DC

## 2022-10-24 MED ORDER — PRAVASTATIN SODIUM 20 MG PO TABS: 20.0000 mg | ORAL_TABLET | Freq: Every day | ORAL | 1 refills | Status: DC

## 2022-10-24 NOTE — Assessment & Plan Note (Signed)
Patient with HTN, well controlled with lisinopril '10mg'$  daily. BP today 115/74.  -continue lisinopril (refilled today)

## 2022-10-24 NOTE — Assessment & Plan Note (Signed)
Lab Results  Component Value Date   HGBA1C 5.6 10/24/2022   Patient with longstanding T2DM, well controlled with Janumet 50-'500mg'$  daily. He had an A1c of 10.9% in 2019, but since that time his subsequent A1c levels have persistently been <6%. A1c today 5.6%. Will liberalize A1c checks to every 6 months.  Plan: -continue janumet -next A1c level in 6 months

## 2022-10-24 NOTE — Patient Instructions (Signed)
[name] Acosta,  It was a pleasure seeing you in the clinic today.   I have refilled your pain medicine, lisinopril, and pravastatin and sent it to the [facility]. Please pick these up and take them as prescribed. Please come back for a follow up visit in 3 months.  Please call our clinic at [phone] if you have any questions or concerns. The best time to call is Monday-Friday from 9am-4pm, but there is someone available 24/7 at the same number. If you need medication refills, please notify your pharmacy one week in advance and they will send [place] a request.   Thank you for letting us take part in your care. We look forward to seeing you next time!

## 2022-10-24 NOTE — Progress Notes (Signed)
   CC: f/u HTN, T2DM  HPI:  [name] Acosta is a 62 y.o. male with history listed below presenting to the [facility] for f/u HTN, T2DM. Please see individualized problem based charting for full HPI.  Past Medical History:  Diagnosis Date   Accidental fall from ladder 12/10/2018   Acute pain of right shoulder 11/26/2018   Arthritis    Bilateral leg pain 09/24/2019   Bilateral lower extremity edema 06/05/2018   Chest pain 05/14/2016   Chest pain    Chronic pain of left ankle 08/01/2011   S/p reconstruction surgery in 1999.    Dark stools 01/12/2020   DM (diabetes mellitus) ([place])    Dyslipidemia 10/18/2006       Dyspnea on exertion 06/05/2018   Dyspnea on exertion   Eye redness 02/12/2019   Hematuria 08/01/2011   Check UA with next visit. Urine cytology in 2009 and 2012- negative. 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 0.3 mm  renal calculus in lower pole of right kidney right kidney seen in renal ultrasound in 2009. History of renal calculus,s/p lithotripsy in 2002 Cystoscopy in 2006 was negative.     Hyperlipidemia    Hypertension    Left leg swelling 01/24/2016   Left leg swelling/pain   Polydipsia 09/07/2020   Prostate disorder    Renal calculus    Right hip pain 12/12/2015   Sleep apnea    Strain of lumbar paraspinal muscle 12/15/2020    Review of Systems:  Negative aside from that listed in individualized problem based charting.  Physical Exam:  Vitals:   10/24/22 0854  BP: 115/74  Pulse: 93  Temp: 97.6 F (36.4 C)  TempSrc: Oral  SpO2: 98%  Weight: 206 lb 8 oz (93.7 kg)   Physical Exam Constitutional:      Appearance: He is obese.     Comments: Disheveled appearing male, appears older than stated age.  HENT:     Mouth/Throat:     Mouth: Mucous membranes are moist.     Pharynx: Oropharynx is clear. No oropharyngeal exudate.  Eyes:     Extraocular Movements: Extraocular movements intact.     Conjunctiva/sclera: Conjunctivae normal.     Pupils: Pupils are equal, round, and  reactive to light.  Cardiovascular:     Rate and Rhythm: Normal rate and regular rhythm.     Pulses: Normal pulses.     Heart sounds: Normal heart sounds. No murmur heard.    No friction rub. No gallop.  Pulmonary:     Effort: Pulmonary effort is normal.     Breath sounds: Normal breath sounds. No wheezing, rhonchi or rales.  Abdominal:     General: Bowel sounds are normal. There is no distension.     Palpations: Abdomen is soft.     Tenderness: There is no abdominal tenderness.  Musculoskeletal:     Comments: Refused assessment of lower extremities.  Skin:    General: Skin is warm and dry.     Comments: Refused assessment of lower extremities.  Neurological:     General: No focal deficit present.     Mental Status: He is alert and oriented to person, place, and time.  Psychiatric:        Mood and Affect: Mood normal.        Behavior: Behavior normal.      Assessment & Plan:   See Encounters Tab for problem based charting.  Patient discussed with Dr. Philipp Ovens

## 2022-10-24 NOTE — Assessment & Plan Note (Signed)
Patient with chronic bilateral low back pain without sciatica and chronic OA of bilateral knees, presenting for medication refill of percocet 7.5-'325mg'$  q8 prn. PDMP reviewed and appropriate. Refilled today for 30-day supply. ToxAssure done in January is appropriate for oxycodone but not showing cymbalta or cyclobenzaprine. Does not appear that patient is taking either of these so will discontinue at this time.  Plan: -refilled percocet for 30-day supply -discontinued cymbalta and cyclobenzaprine

## 2022-10-24 NOTE — Assessment & Plan Note (Addendum)
Patient with chronic L ankle wound, dating back to 02/2022. He has not gone to wound care clinic and would not like active treatment at this time. He is using home remedies for treatment as he states that treatment strategies from providers are only making the wounds worse. He refused assessment of both lower extremities today, but states that he is having less drainage from wounds and he believes they are getting better. He denies any systemic signs of infection. Of note, patient still has not completed orange card paperwork and thus services that can be offered are currently limited as they are cost-prohibitive.  Plan: -attempt to reassess lower extremities at next visit

## 2022-10-30 NOTE — Progress Notes (Signed)
Internal Medicine Clinic Attending  Case discussed with Dr. Jinwala  At the time of the visit.  We reviewed the resident's history and exam and pertinent patient test results.  I agree with the assessment, diagnosis, and plan of care documented in the resident's note.  

## 2022-11-12 ENCOUNTER — Other Ambulatory Visit (HOSPITAL_COMMUNITY): Payer: Self-pay

## 2022-11-19 ENCOUNTER — Other Ambulatory Visit: Payer: Self-pay | Admitting: Student

## 2022-11-19 ENCOUNTER — Other Ambulatory Visit: Payer: Self-pay | Admitting: Internal Medicine

## 2022-11-19 ENCOUNTER — Telehealth: Payer: Self-pay

## 2022-11-19 DIAGNOSIS — M545 Low back pain, unspecified: Secondary | ICD-10-CM

## 2022-11-19 NOTE — Telephone Encounter (Signed)
oxyCODONE-acetaminophen (PERCOCET) 7.5-325 MG tablet, and Doxazosin '8mg'$  @ [place], [place] - [place].

## 2022-11-19 NOTE — Telephone Encounter (Signed)
Refills requests sent to PCP in separate Refill encounters.

## 2022-11-20 ENCOUNTER — Other Ambulatory Visit: Payer: Self-pay | Admitting: Internal Medicine

## 2022-11-20 DIAGNOSIS — G8929 Other chronic pain: Secondary | ICD-10-CM

## 2022-11-20 NOTE — Telephone Encounter (Signed)
Last rx written 10/24/22. Last OV  10/24/22. Next OV 01/25/23 with Dr Allyson Sabal. TOx 01/19/22.

## 2022-11-20 NOTE — Telephone Encounter (Signed)
Pt would like to get his medication filled by today. Please call pt back.

## 2022-11-21 ENCOUNTER — Other Ambulatory Visit: Payer: Self-pay | Admitting: Student

## 2022-11-21 DIAGNOSIS — M545 Low back pain, unspecified: Secondary | ICD-10-CM

## 2022-11-21 MED ORDER — DOXAZOSIN MESYLATE 8 MG PO TABS: 8.0000 mg | ORAL_TABLET | Freq: Every day | ORAL | 1 refills | Status: DC

## 2022-11-21 MED ORDER — OXYCODONE-ACETAMINOPHEN 7.5-325 MG PO TABS: ORAL_TABLET | ORAL | 0 refills | Status: DC

## 2022-11-21 NOTE — Progress Notes (Signed)
Patient called after hours number as he is requesting a refill of his percocet and doxysin. Last refill was 10/24/22 and PDMP is appropriate.

## 2022-12-07 ENCOUNTER — Other Ambulatory Visit: Payer: Self-pay | Admitting: Internal Medicine

## 2022-12-07 ENCOUNTER — Other Ambulatory Visit (HOSPITAL_COMMUNITY): Payer: Self-pay

## 2022-12-07 DIAGNOSIS — E119 Type 2 diabetes mellitus without complications: Secondary | ICD-10-CM

## 2022-12-10 ENCOUNTER — Other Ambulatory Visit (HOSPITAL_COMMUNITY): Payer: Self-pay

## 2022-12-10 MED ORDER — JANUMET 50-500 MG PO TABS
1.0000 | ORAL_TABLET | Freq: Every day | ORAL | 5 refills | Status: DC
  Filled 2022-12-10: qty 30, 30d supply, fill #0
  Filled 2023-01-09: qty 30, 30d supply, fill #1
  Filled 2023-02-08: qty 30, 30d supply, fill #2
  Filled 2023-03-11: qty 30, 30d supply, fill #3
  Filled 2023-04-08: qty 30, 30d supply, fill #4

## 2022-12-11 ENCOUNTER — Other Ambulatory Visit (HOSPITAL_COMMUNITY): Payer: Self-pay

## 2022-12-13 ENCOUNTER — Other Ambulatory Visit: Payer: Self-pay

## 2022-12-13 DIAGNOSIS — M545 Low back pain, unspecified: Secondary | ICD-10-CM

## 2022-12-13 MED ORDER — OXYCODONE-ACETAMINOPHEN 7.5-325 MG PO TABS: ORAL_TABLET | ORAL | 0 refills | Status: DC

## 2023-01-09 ENCOUNTER — Other Ambulatory Visit (HOSPITAL_COMMUNITY): Payer: Self-pay

## 2023-01-21 ENCOUNTER — Other Ambulatory Visit: Payer: Self-pay

## 2023-01-21 DIAGNOSIS — M545 Low back pain, unspecified: Secondary | ICD-10-CM

## 2023-01-21 MED ORDER — OXYCODONE-ACETAMINOPHEN 7.5-325 MG PO TABS: ORAL_TABLET | ORAL | 0 refills | Status: DC

## 2023-01-25 ENCOUNTER — Ambulatory Visit (INDEPENDENT_AMBULATORY_CARE_PROVIDER_SITE_OTHER): Payer: Self-pay | Admitting: Student

## 2023-01-25 ENCOUNTER — Encounter: Payer: Self-pay | Admitting: Student

## 2023-01-25 VITALS — BP 118/63 | HR 102 | Temp 97.6°F | Ht 68.0 in | Wt 198.8 lb

## 2023-01-25 DIAGNOSIS — F17210 Nicotine dependence, cigarettes, uncomplicated: Secondary | ICD-10-CM

## 2023-01-25 DIAGNOSIS — E119 Type 2 diabetes mellitus without complications: Secondary | ICD-10-CM

## 2023-01-25 DIAGNOSIS — S91002A Unspecified open wound, left ankle, initial encounter: Secondary | ICD-10-CM

## 2023-01-25 DIAGNOSIS — Z7984 Long term (current) use of oral hypoglycemic drugs: Secondary | ICD-10-CM

## 2023-01-25 DIAGNOSIS — I10 Essential (primary) hypertension: Secondary | ICD-10-CM

## 2023-01-25 NOTE — Assessment & Plan Note (Signed)
Patient living with HTN, on lisinopril '10mg'$  daily. BP 118/63 today, at goal. Will check BMP today given it has been over a year since last check to assess potassium while on lisinopril therapy.  Plan: -continue lisinopril -f/u BMP

## 2023-01-25 NOTE — Assessment & Plan Note (Signed)
Assess L ankle during foot exam. Skin is discolored over sites of previous wounds but has healed well with no open lesions/wounds noted today.

## 2023-01-25 NOTE — Patient Instructions (Signed)
[name] Acosta,  It was a pleasure seeing you in the clinic today.   Your blood pressure looks great today. We are checking a lab today to make sure your electrolytes and kidney function are stable. I will call you with results. Please come back in 3 months for your next visit.  Please call our clinic at [phone] if you have any questions or concerns. The best time to call is Monday-Friday from 9am-4pm, but there is someone available 24/7 at the same number. If you need medication refills, please notify your pharmacy one week in advance and they will send [place] a request.   Thank you for letting us take part in your care. We look forward to seeing you next time!

## 2023-01-25 NOTE — Assessment & Plan Note (Addendum)
Patient with well-controlled T2DM, on janumet 50-'500mg'$  daily. His A1c levels have persistently been <6% over the past couple of years, indicating great glycemic control. Will check next A1c in 3 months. Foot exam completed today.  -f/u in 3 months for repeat A1c -completed foot exam

## 2023-01-25 NOTE — Progress Notes (Signed)
CC: f/u T2DM, HTN  HPI:  [name] Acosta is a 63 y.o. male with history listed below presenting to the [facility] for f/u HTN, T2DM. Please see individualized problem based charting for full HPI.  Past Medical History:  Diagnosis Date   Accidental fall from ladder 12/10/2018   Acute pain of right shoulder 11/26/2018   Arthritis    Bilateral leg pain 09/24/2019   Bilateral lower extremity edema 06/05/2018   Chest pain 05/14/2016   Chest pain    Chronic pain of left ankle 08/01/2011   S/p reconstruction surgery in 1999.    Dark stools 01/12/2020   DM (diabetes mellitus) ([place])    Dyslipidemia 10/18/2006       Dyspnea on exertion 06/05/2018   Dyspnea on exertion   Eye redness 02/12/2019   Hematuria 08/01/2011   Check UA with next visit. Urine cytology in 2009 and 2012- negative. 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 0.3 mm  renal calculus in lower pole of right kidney right kidney seen in renal ultrasound in 2009. History of renal calculus,s/p lithotripsy in 2002 Cystoscopy in 2006 was negative.     Hyperlipidemia    Hypertension    Left leg swelling 01/24/2016   Left leg swelling/pain   Polydipsia 09/07/2020   Prostate disorder    Renal calculus    Right hip pain 12/12/2015   Sleep apnea    Strain of lumbar paraspinal muscle 12/15/2020    Review of Systems:  Negative aside from that listed in individualized problem based charting.  Physical Exam:  Vitals:   01/25/23 1026 01/25/23 1027  BP:  118/63  Pulse:  (!) 102  Temp:  97.6 F (36.4 C)  TempSrc:  Oral  SpO2:  97%  Weight: 198 lb 12.8 oz (90.2 kg) 198 lb 12.8 oz (90.2 kg)  Height: '5\' 8"'$  (1.727 m) '5\' 8"'$  (1.727 m)   Physical Exam Constitutional:      Appearance: Normal appearance. He is obese. He is not ill-appearing.  HENT:     Mouth/Throat:     Mouth: Mucous membranes are moist.     Pharynx: Oropharynx is clear.  Eyes:     Extraocular Movements: Extraocular movements intact.     Conjunctiva/sclera: Conjunctivae normal.      Pupils: Pupils are equal, round, and reactive to light.  Cardiovascular:     Rate and Rhythm: Normal rate and regular rhythm.     Heart sounds: Normal heart sounds. No murmur heard.    No gallop.  Pulmonary:     Effort: Pulmonary effort is normal.     Breath sounds: Normal breath sounds. No wheezing, rhonchi or rales.  Abdominal:     General: Bowel sounds are normal. There is no distension.     Palpations: Abdomen is soft.     Tenderness: There is no abdominal tenderness.  Musculoskeletal:        General: No swelling. Normal range of motion.  Skin:    General: Skin is warm and dry.     Comments: Chronic skin discoloration around left ankle overlying area of previous ankle wound. No open lesions or wounds noted. Skin has healed well.  Neurological:     Mental Status: He is alert and oriented to person, place, and time. Mental status is at baseline.     Comments: Lack of sensation on plantar surface of base of toes in left foot. Normal sensation in R foot.  Psychiatric:        Mood and Affect: Mood  normal.        Behavior: Behavior normal.      Assessment & Plan:   See Encounters Tab for problem based charting.  Patient discussed with Dr. Evette Doffing

## 2023-01-28 NOTE — Progress Notes (Signed)
Patient called.  Patient aware. Stable kidney function and electrolytes on lisinopril therapy.

## 2023-01-28 NOTE — Progress Notes (Signed)
Internal Medicine Clinic Attending  Case discussed with Dr. Jinwala  At the time of the visit.  We reviewed the resident's history and exam and pertinent patient test results.  I agree with the assessment, diagnosis, and plan of care documented in the resident's note.  

## 2023-01-30 LAB — BMP8+ANION GAP
Anion Gap: 18 mmol/L (ref 10.0–18.0)
BUN/Creatinine Ratio: 17 (ref 10–24)
BUN: 14 mg/dL (ref 8–27)
CO2: 21 mmol/L (ref 20–29)
Calcium: 9.2 mg/dL (ref 8.6–10.2)
Chloride: 105 mmol/L (ref 96–106)
Creatinine, Ser: 0.82 mg/dL (ref 0.76–1.27)
Glucose: 96 mg/dL (ref 70–99)
Potassium: 4.3 mmol/L (ref 3.5–5.2)
Sodium: 144 mmol/L (ref 134–144)
eGFR: 99 mL/min/{1.73_m2} (ref >59)

## 2023-02-08 ENCOUNTER — Other Ambulatory Visit (HOSPITAL_COMMUNITY): Payer: Self-pay

## 2023-02-11 ENCOUNTER — Other Ambulatory Visit (HOSPITAL_COMMUNITY): Payer: Self-pay

## 2023-02-18 ENCOUNTER — Other Ambulatory Visit: Payer: Self-pay | Admitting: Student

## 2023-02-18 DIAGNOSIS — G8929 Other chronic pain: Secondary | ICD-10-CM

## 2023-03-11 ENCOUNTER — Other Ambulatory Visit (HOSPITAL_COMMUNITY): Payer: Self-pay

## 2023-03-19 ENCOUNTER — Other Ambulatory Visit: Payer: Self-pay | Admitting: Student

## 2023-03-19 DIAGNOSIS — M545 Low back pain, unspecified: Secondary | ICD-10-CM

## 2023-03-19 NOTE — Telephone Encounter (Signed)
Last rx written 02/18/23. Last OV 01/25/23. Next OV 04/26/23. [place] 01/19/22.

## 2023-04-08 ENCOUNTER — Other Ambulatory Visit (HOSPITAL_COMMUNITY): Payer: Self-pay

## 2023-04-19 ENCOUNTER — Other Ambulatory Visit: Payer: Self-pay | Admitting: Student

## 2023-04-19 ENCOUNTER — Other Ambulatory Visit: Payer: Self-pay

## 2023-04-19 DIAGNOSIS — M545 Low back pain, unspecified: Secondary | ICD-10-CM

## 2023-04-26 ENCOUNTER — Other Ambulatory Visit (HOSPITAL_COMMUNITY): Payer: Self-pay

## 2023-04-26 ENCOUNTER — Ambulatory Visit (INDEPENDENT_AMBULATORY_CARE_PROVIDER_SITE_OTHER): Payer: Self-pay | Admitting: Student

## 2023-04-26 ENCOUNTER — Encounter: Payer: Self-pay | Admitting: Student

## 2023-04-26 VITALS — BP 110/73 | HR 91 | Temp 97.8°F | Wt 196.5 lb

## 2023-04-26 DIAGNOSIS — I10 Essential (primary) hypertension: Secondary | ICD-10-CM

## 2023-04-26 DIAGNOSIS — E785 Hyperlipidemia, unspecified: Secondary | ICD-10-CM

## 2023-04-26 DIAGNOSIS — Z7984 Long term (current) use of oral hypoglycemic drugs: Secondary | ICD-10-CM

## 2023-04-26 DIAGNOSIS — M545 Low back pain, unspecified: Secondary | ICD-10-CM

## 2023-04-26 DIAGNOSIS — G8929 Other chronic pain: Secondary | ICD-10-CM

## 2023-04-26 DIAGNOSIS — F17210 Nicotine dependence, cigarettes, uncomplicated: Secondary | ICD-10-CM

## 2023-04-26 DIAGNOSIS — F17200 Nicotine dependence, unspecified, uncomplicated: Secondary | ICD-10-CM

## 2023-04-26 DIAGNOSIS — E119 Type 2 diabetes mellitus without complications: Secondary | ICD-10-CM

## 2023-04-26 LAB — POCT GLYCOSYLATED HEMOGLOBIN (HGB A1C): Hemoglobin A1C: 5.7 % — AB (ref 4.0–5.6)

## 2023-04-26 LAB — GLUCOSE, CAPILLARY: Glucose-Capillary: 109 mg/dL — ABNORMAL HIGH (ref 70–99)

## 2023-04-26 MED ORDER — METFORMIN HCL ER 500 MG PO TB24
500.0000 mg | ORAL_TABLET | Freq: Every day | ORAL | 11 refills | Status: DC
Start: 2023-04-26 — End: 2024-04-16
  Filled 2023-04-26: qty 30, 30d supply, fill #0
  Filled 2023-05-28: qty 30, 30d supply, fill #1
  Filled 2023-06-27: qty 30, 30d supply, fill #2
  Filled 2023-07-29: qty 30, 30d supply, fill #3
  Filled 2023-08-27: qty 30, 30d supply, fill #4
  Filled 2023-09-23: qty 30, 30d supply, fill #5
  Filled 2023-10-22: qty 30, 30d supply, fill #6
  Filled 2023-11-20: qty 30, 30d supply, fill #7
  Filled 2023-12-18: qty 30, 30d supply, fill #8
  Filled 2024-01-20: qty 30, 30d supply, fill #9
  Filled 2024-02-14: qty 30, 30d supply, fill #10
  Filled 2024-03-18: qty 30, 30d supply, fill #11

## 2023-04-26 MED ORDER — NICOTINE 21 MG/24HR TD PT24
21.0000 mg | MEDICATED_PATCH | TRANSDERMAL | 0 refills | Status: AC
Start: 2023-04-26 — End: ?
  Filled 2023-04-26: qty 14, 14d supply, fill #0

## 2023-04-26 NOTE — Patient Instructions (Signed)
[name],  It was a pleasure seeing you in the clinic today.   Your A1c looks great again today. I have stopped your janumet because you do not need the sitagliptan. I have prescribed only metformin at your previous dose. Please pick this up and take it once a day. I have prescribed nicotine patches for you. Please come back in 3 months for your next visit.  Please call our clinic at [phone] if you have any questions or concerns. The best time to call is Monday-Friday from 9am-4pm, but there is someone available 24/7 at the same number. If you need medication refills, please notify your pharmacy one week in advance and they will send [place] a request.   Thank you for letting us take part in your care. We look forward to seeing you next time!

## 2023-04-26 NOTE — Assessment & Plan Note (Signed)
Well controlled on janumet 50-500mg  daily. A1c has remained <6% for the past couple of years. A1c today 5.7%. Will stop janumet and continue metformin-xr monotherapy at 500mg  daily. Next A1c check in 6 months.  Plan: -stop janumet -start metformin-xr 500mg  daily -next A1c check in 6 months

## 2023-04-26 NOTE — Assessment & Plan Note (Signed)
Continues to smoke about 1 pack/day. Not ready to quit. Granddaughter's graduation is coming up in [place] and he needs to sit through a 12-hour car ride. Requesting nicotine patch to help prevent cravings during this trip. Will provide nicotine patches.

## 2023-04-26 NOTE — Progress Notes (Signed)
   CC: f/u HTN, T2DM, tobacco use  HPI:  [name] is a 63 y.o. male with history listed below presenting to the [facility] for f/u HTN, T2DM, tobacco use. Please see individualized problem based charting for full HPI.  Past Medical History:  Diagnosis Date   Accidental fall from ladder 12/10/2018   Acute pain of right shoulder 11/26/2018   Arthritis    Bilateral leg pain 09/24/2019   Bilateral lower extremity edema 06/05/2018   Chest pain 05/14/2016   Chest pain    Chronic pain of left ankle 08/01/2011   S/p reconstruction surgery in 1999.    Dark stools 01/12/2020   DM (diabetes mellitus) ([facility])    Dyslipidemia 10/18/2006       Dyspnea on exertion 06/05/2018   Dyspnea on exertion   Eye redness 02/12/2019   Hematuria 08/01/2011   Check UA with next visit. Urine cytology in 2009 and 2012- negative. 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 0.3 mm  renal calculus in lower pole of right kidney right kidney seen in renal ultrasound in 2009. History of renal calculus,s/p lithotripsy in 2002 Cystoscopy in 2006 was negative.     Hyperlipidemia    Hypertension    Left leg swelling 01/24/2016   Left leg swelling/pain   Polydipsia 09/07/2020   Prostate disorder    Renal calculus    Right hip pain 12/12/2015   Sleep apnea    Strain of lumbar paraspinal muscle 12/15/2020    Review of Systems:  Negative aside from that listed in individualized problem based charting.  Physical Exam:  Vitals:   04/26/23 1100  BP: 110/73  Pulse: 91  Temp: 97.8 F (36.6 C)  TempSrc: Oral  SpO2: 99%  Weight: 196 lb 8 oz (89.1 kg)   Physical Exam Constitutional:      Comments: Unkempt elderly male, sitting in wheelchair, NAD.  HENT:     Mouth/Throat:     Mouth: Mucous membranes are moist.     Pharynx: Oropharynx is clear. No oropharyngeal exudate.  Eyes:     General: No scleral icterus.    Extraocular Movements: Extraocular movements intact.     Conjunctiva/sclera: Conjunctivae normal.     Pupils: Pupils are  equal, round, and reactive to light.  Cardiovascular:     Rate and Rhythm: Normal rate and regular rhythm.     Heart sounds: Normal heart sounds. No murmur heard.    No friction rub. No gallop.  Pulmonary:     Effort: Pulmonary effort is normal.     Breath sounds: Normal breath sounds. No wheezing, rhonchi or rales.  Abdominal:     General: Bowel sounds are normal. There is no distension.     Palpations: Abdomen is soft.     Tenderness: There is no abdominal tenderness. There is no guarding or rebound.  Musculoskeletal:        General: No swelling. Normal range of motion.  Skin:    General: Skin is warm and dry.  Neurological:     Mental Status: He is alert and oriented to person, place, and time. Mental status is at baseline.  Psychiatric:        Mood and Affect: Mood normal.        Behavior: Behavior normal.      Assessment & Plan:   See Encounters Tab for problem based charting.  Patient discussed with Dr. Mikey Bussing

## 2023-04-26 NOTE — Assessment & Plan Note (Signed)
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 Reports doing well with this dose and is more functional at home. Does not appear high risk for misuse. PDMP reviewed and appropriate, last filled 04/20/2023.   -continue percocet

## 2023-04-26 NOTE — Assessment & Plan Note (Signed)
BP 110/73, on lisinopril 10mg  daily. Recent BMP with stable kidney function and electrolytes.  -continue lisinopril

## 2023-04-26 NOTE — Assessment & Plan Note (Signed)
On pravastatin 20mg  qhs at home. Unable to afford lipid panel today (self-pay). Does need lipid panel checked in the near future once able to afford in order to assess lipid control.

## 2023-05-01 ENCOUNTER — Other Ambulatory Visit: Payer: Self-pay | Admitting: Student

## 2023-05-01 DIAGNOSIS — E785 Hyperlipidemia, unspecified: Secondary | ICD-10-CM

## 2023-05-01 NOTE — Progress Notes (Signed)
Internal Medicine Clinic Attending  Case discussed with the resident at the time of the visit.  We reviewed the resident's history and exam and pertinent patient test results.  I agree with the assessment, diagnosis, and plan of care documented in the resident's note.  

## 2023-05-17 ENCOUNTER — Other Ambulatory Visit: Payer: Self-pay | Admitting: Student

## 2023-05-17 DIAGNOSIS — G8929 Other chronic pain: Secondary | ICD-10-CM

## 2023-05-28 ENCOUNTER — Other Ambulatory Visit (HOSPITAL_COMMUNITY): Payer: Self-pay

## 2023-05-29 ENCOUNTER — Other Ambulatory Visit (HOSPITAL_COMMUNITY): Payer: Self-pay

## 2023-06-13 ENCOUNTER — Other Ambulatory Visit: Payer: Self-pay | Admitting: Student

## 2023-06-13 DIAGNOSIS — M545 Low back pain, unspecified: Secondary | ICD-10-CM

## 2023-06-19 ENCOUNTER — Telehealth: Payer: Self-pay | Admitting: Student

## 2023-06-19 DIAGNOSIS — G8929 Other chronic pain: Secondary | ICD-10-CM

## 2023-06-19 MED ORDER — OXYCODONE-ACETAMINOPHEN 7.5-325 MG PO TABS
1.0000 | ORAL_TABLET | Freq: Three times a day (TID) | ORAL | 0 refills | Status: DC | PRN
Start: 2023-06-19 — End: 2023-08-20

## 2023-06-19 NOTE — Telephone Encounter (Signed)
Patient would a to have his Percocet refill. Last refill 5/17. Refill sent to [facility] pharmacy today

## 2023-06-24 ENCOUNTER — Telehealth: Payer: Self-pay | Admitting: Student

## 2023-06-24 NOTE — Telephone Encounter (Signed)
Pt Requesting a call back about taking the following medications.  Pt states he is drinking 2-3 cases of water per week.  Pt states he can not get enough liquids in him.  Patient states since taking the following medications he stays thirsty and urinates as soon as he drinks.   metFORMIN (GLUCOPHAGE-XR) 500 MG 24 hr tablet

## 2023-06-25 NOTE — Telephone Encounter (Signed)
Patient calling back. States he is "putting out twice as much water as I'm taking in." Does not take CBGs at home 2/2 cost of strips. Denies any recent weight loss. Appt given for tomorrow at 3:15. He will call around for transportation and call back to cancel if he is not able to find someone. He is advised to head to UC if he is unable to locate transportation for appt. He is hesitant to do that. Stressed importance of being evaluated as soon as possible.

## 2023-06-26 ENCOUNTER — Encounter: Payer: Self-pay | Admitting: Student

## 2023-06-26 NOTE — Telephone Encounter (Signed)
Patient called back stating his ride cannot bring him to today's appt. He is advised to go to UC today when his ride can take him. States he will try to find someone with a glucometer to check his CBG. If it is high, he will go to UC. He was transferred back to FO to schedule first available appt (next week).   He also mentioned that he has had painful blisters on his hands for 4 years and this has never been addressed. Would like to discuss this at next OV.

## 2023-06-27 ENCOUNTER — Other Ambulatory Visit (HOSPITAL_COMMUNITY): Payer: Self-pay

## 2023-07-02 ENCOUNTER — Encounter: Payer: Self-pay | Admitting: Student

## 2023-07-26 ENCOUNTER — Ambulatory Visit (INDEPENDENT_AMBULATORY_CARE_PROVIDER_SITE_OTHER): Payer: Self-pay | Admitting: Student

## 2023-07-26 ENCOUNTER — Encounter: Payer: Self-pay | Admitting: Student

## 2023-07-26 VITALS — BP 136/82 | HR 98 | Temp 98.0°F | Wt 192.2 lb

## 2023-07-26 DIAGNOSIS — Z5989 Other problems related to housing and economic circumstances: Secondary | ICD-10-CM

## 2023-07-26 DIAGNOSIS — R21 Rash and other nonspecific skin eruption: Secondary | ICD-10-CM

## 2023-07-26 DIAGNOSIS — R631 Polydipsia: Secondary | ICD-10-CM

## 2023-07-26 DIAGNOSIS — I10 Essential (primary) hypertension: Secondary | ICD-10-CM

## 2023-07-26 NOTE — Progress Notes (Unsigned)
CC: polydipsia, medication request  HPI:  [name] Acosta is a 63 y.o. male living with a history stated below and presents today for polydipsia, medication request. Please see problem based assessment and plan for additional details.  Past Medical History:  Diagnosis Date   Accidental fall from ladder 12/10/2018   Acute pain of right shoulder 11/26/2018   Arthritis    Bilateral leg pain 09/24/2019   Bilateral lower extremity edema 06/05/2018   Chest pain 05/14/2016   Chest pain    Chronic pain of left ankle 08/01/2011   S/p reconstruction surgery in 1999.    Dark stools 01/12/2020   DM (diabetes mellitus) ([facility])    Dyslipidemia 10/18/2006       Dyspnea on exertion 06/05/2018   Dyspnea on exertion   Eye redness 02/12/2019   Hematuria 08/01/2011   Check UA with next visit. Urine cytology in 2009 and 2012- negative. 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 0.3 mm  renal calculus in lower pole of right kidney right kidney seen in renal ultrasound in 2009. History of renal calculus,s/p lithotripsy in 2002 Cystoscopy in 2006 was negative.     Hyperlipidemia    Hypertension    Left leg swelling 01/24/2016   Left leg swelling/pain   Polydipsia 09/07/2020   Prostate disorder    Renal calculus    Right hip pain 12/12/2015   Sleep apnea    Strain of lumbar paraspinal muscle 12/15/2020    Current Outpatient Medications on File Prior to Visit  Medication Sig Dispense Refill   Cholecalciferol (VITAMIN D-3) 1000 units CAPS Take 1 capsule by mouth daily.     doxazosin (CARDURA) 8 MG tablet TAKE ONE TABLET BY MOUTH ONCE A DAY WITH BREAKFAST. 90 tablet 1   esomeprazole (NEXIUM) 40 MG capsule Take 40 mg by mouth once a week.     glucose blood (ACCU-CHEK AVIVA) test strip Test blood glucose up to twice daily before meals. The patient is not insulin requiring, ICD 10 code E11.9. 100 each 11   guaifenesin (HUMIBID E) 400 MG TABS tablet Take 400 mg by mouth 2 (two) times daily.     lisinopril (ZESTRIL) 10 MG  tablet Take 1 tablet (10 mg total) by mouth daily. 90 tablet 3   loperamide (IMODIUM) 2 MG capsule Take 2 mg by mouth as needed for diarrhea or loose stools.     loratadine (CLARITIN) 10 MG tablet Take 10 mg by mouth 2 (two) times daily.     metFORMIN (GLUCOPHAGE-XR) 500 MG 24 hr tablet Take 1 tablet (500 mg total) by mouth daily with breakfast. 30 tablet 11   nicotine (NICODERM CQ - DOSED IN MG/24 HOURS) 21 mg/24hr patch Place 1 patch (21 mg total) onto the skin daily. 14 patch 0   nitroGLYCERIN (NITROSTAT) 0.4 MG SL tablet Place 1 tablet (0.4 mg total) under the tongue every 5 (five) minutes as needed for chest pain. (Patient not taking: Reported on 02/26/2020) 100 tablet 3   oxyCODONE-acetaminophen (PERCOCET) 7.5-325 MG tablet Take 1 tablet by mouth every 8 (eight) hours as needed for severe pain. 90 tablet 0   pravastatin (PRAVACHOL) 20 MG tablet TAKE ONE TABLET BY MOUTH AT BEDTIME 90 tablet 2   SUMAtriptan (IMITREX) 6 MG/0.5ML SOLN injection INJECT 0.5 ML (6 MG TOTAL) INTO THE SKINEVERY 2 HOURS AS NEEDED FOR MIGRAINE OR HEADACHE. MAY REPEAT IN 2 HOURS IF HEADACHE PERSISTS OR RECU (Patient taking differently: as needed. ) 2.5 mL 2   No current facility-administered  medications on file prior to visit.   Review of Systems: ROS negative except for what is noted on the assessment and plan.  Vitals:   07/26/23 1038  BP: 136/82  Pulse: 98  Temp: 98 F (36.7 C)  TempSrc: Oral  SpO2: 98%  Weight: 192 lb 3.2 oz (87.2 kg)   Physical Exam: Constitutional: alert, chronically ill-appearing male sitting in wheelchair, in no acute distress HENT: normocephalic atraumatic Cardiovascular: regular rate and rhythm Pulmonary/Chest: normal work of breathing on room air Skin: healing excoriations over hands without bleeding or drainage Psych: agitated mood  Assessment & Plan:   Essential hypertension BP fairly controlled today at 136/82 on lisinopril 10 mg daily. Tolerating and reports fair  adherence. BMP in January was WNL.   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  States was on DDAVP for few months after surgery but unable to continue after that. Continues to endorse polydipsia drinking 2-3 cases of water a week and case of diet pepsi and gatorade. Recent BMP in January appears WNL. No signs of hypernatremia if suspicion for diabetes insipidus. Discussed with patient about UA today but he declined stating unable to void at this time. Declined further assessment due to financial constraints at this time.   Does not have health insurance States no coverage at this time. Filed financial assistance before but patient states paperwork was lost. Declined further assistance at this time with financial assistance. Declined any additional resources at this time. States he does not want to try again.   Rash and nonspecific skin eruption Prior OV discussed plan for further evaluation for concern of porphyria tarda. However, patient remains uninsured and states will not complete any further paperwork for assistance at this time. Discussed costs and encouraged patient to obtain financial assistance which patient remains somewhat reluctant on. Will reassess at next OV regarding orange card for further labs.    Patient discussed with Dr. Nida Boatman, D.O. [facility], PGY-2 Phone: [phone] Date 07/26/2023 Time 10:45 AM

## 2023-07-26 NOTE — Patient Instructions (Addendum)
Thank you, Mr.Jaleel H Makris for allowing [place] to provide your care today. Today we discussed:  -Blood pressure looks good today. Continue lisinopril. -Your last blood work for electrolytes looked normal.  -Will recheck your diabetes at next visit.  -Unable to urinate today but can check urine at next visit.  -If you are interested in applying financial assistance, please let us know.   Follow up:  2-3 month    Should you have any questions or concerns please call the internal medicine clinic at [phone].    Rana Snare, D.O. [facility]

## 2023-07-27 DIAGNOSIS — Z5989 Other problems related to housing and economic circumstances: Secondary | ICD-10-CM | POA: Insufficient documentation

## 2023-07-27 NOTE — Assessment & Plan Note (Signed)
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  States was on DDAVP for few months after surgery but unable to continue after that. Continues to endorse polydipsia drinking 2-3 cases of water a week and case of diet pepsi and gatorade. Recent BMP in January appears WNL. No signs of hypernatremia if suspicion for diabetes insipidus. Discussed with patient about UA today but he declined stating unable to void at this time. Declined further assessment due to financial constraints at this time.

## 2023-07-27 NOTE — Assessment & Plan Note (Signed)
States no coverage at this time. Filed financial assistance before but patient states paperwork was lost. Declined further assistance at this time with financial assistance. Declined any additional resources at this time. States he does not want to try again.

## 2023-07-27 NOTE — Assessment & Plan Note (Signed)
BP fairly controlled today at 136/82 on lisinopril 10 mg daily. Tolerating and reports fair adherence. BMP in January was WNL.   Plan -Continue lisinopril

## 2023-07-28 NOTE — Assessment & Plan Note (Signed)
Prior OV discussed plan for further evaluation for concern of porphyria tarda. However, patient remains uninsured and states will not complete any further paperwork for assistance at this time. Discussed costs and encouraged patient to obtain financial assistance which patient remains somewhat reluctant on. Will reassess at next OV regarding orange card for further labs.

## 2023-07-29 ENCOUNTER — Other Ambulatory Visit (HOSPITAL_COMMUNITY): Payer: Self-pay

## 2023-07-29 NOTE — Progress Notes (Signed)
 Internal Medicine Clinic Attending  Case discussed with the resident physician at the time of the visit.  We reviewed the patient's history, exam, and pertinent patient test results.  I agree with the assessment, diagnosis, and plan of care documented in the resident's note.

## 2023-08-20 ENCOUNTER — Other Ambulatory Visit: Payer: Self-pay

## 2023-08-20 DIAGNOSIS — G8929 Other chronic pain: Secondary | ICD-10-CM

## 2023-08-21 MED ORDER — OXYCODONE-ACETAMINOPHEN 7.5-325 MG PO TABS
1.0000 | ORAL_TABLET | Freq: Three times a day (TID) | ORAL | 0 refills | Status: DC | PRN
Start: 2023-08-21 — End: 2023-09-18

## 2023-08-27 ENCOUNTER — Other Ambulatory Visit: Payer: Self-pay

## 2023-08-27 ENCOUNTER — Other Ambulatory Visit (HOSPITAL_COMMUNITY): Payer: Self-pay

## 2023-09-18 ENCOUNTER — Other Ambulatory Visit: Payer: Self-pay | Admitting: Student

## 2023-09-18 DIAGNOSIS — G8929 Other chronic pain: Secondary | ICD-10-CM

## 2023-09-23 ENCOUNTER — Other Ambulatory Visit (HOSPITAL_COMMUNITY): Payer: Self-pay

## 2023-10-17 ENCOUNTER — Other Ambulatory Visit: Payer: Self-pay | Admitting: Student

## 2023-10-17 DIAGNOSIS — M545 Low back pain, unspecified: Secondary | ICD-10-CM

## 2023-10-22 ENCOUNTER — Other Ambulatory Visit (HOSPITAL_COMMUNITY): Payer: Self-pay

## 2023-10-25 ENCOUNTER — Other Ambulatory Visit: Payer: Self-pay

## 2023-10-25 DIAGNOSIS — I10 Essential (primary) hypertension: Secondary | ICD-10-CM

## 2023-10-25 MED ORDER — DOXAZOSIN MESYLATE 8 MG PO TABS: 8.0000 mg | ORAL_TABLET | Freq: Every day | ORAL | 1 refills | Status: DC

## 2023-10-25 MED ORDER — LISINOPRIL 10 MG PO TABS: 10.0000 mg | ORAL_TABLET | Freq: Every day | ORAL | 3 refills | Status: DC

## 2023-10-28 ENCOUNTER — Ambulatory Visit (INDEPENDENT_AMBULATORY_CARE_PROVIDER_SITE_OTHER): Payer: Self-pay | Admitting: Student

## 2023-10-28 ENCOUNTER — Encounter: Payer: Self-pay | Admitting: Student

## 2023-10-28 ENCOUNTER — Other Ambulatory Visit: Payer: Self-pay

## 2023-10-28 VITALS — BP 139/83 | HR 93 | Temp 98.1°F | Resp 28 | Ht 69.0 in | Wt 182.9 lb

## 2023-10-28 DIAGNOSIS — E119 Type 2 diabetes mellitus without complications: Secondary | ICD-10-CM

## 2023-10-28 DIAGNOSIS — Z5971 Insufficient health insurance coverage: Secondary | ICD-10-CM

## 2023-10-28 DIAGNOSIS — R21 Rash and other nonspecific skin eruption: Secondary | ICD-10-CM

## 2023-10-28 DIAGNOSIS — Z7984 Long term (current) use of oral hypoglycemic drugs: Secondary | ICD-10-CM

## 2023-10-28 DIAGNOSIS — R631 Polydipsia: Secondary | ICD-10-CM

## 2023-10-28 LAB — GLUCOSE, CAPILLARY: Glucose-Capillary: 123 mg/dL — ABNORMAL HIGH (ref 70–99)

## 2023-10-28 LAB — POCT GLYCOSYLATED HEMOGLOBIN (HGB A1C): Hemoglobin A1C: 5.8 % — AB (ref 4.0–5.6)

## 2023-10-28 NOTE — Patient Instructions (Signed)
Thank you, JamesJaythen H Acosta for allowing [place] to provide your care today. Today we discussed your HTN, diabetes, polydipsia (increased thirst), and your rash.    I have ordered the following labs for you:   Lab Orders         Microalbumin / Creatinine Urine Ratio         Glucose, capillary         POC Hbg A1C      Tests ordered today:  - none  Referrals ordered today:   Referral Orders  No referral(s) requested today     I have ordered the following medication/changed the following medications:   Stop the following medications: Medications Discontinued During This Encounter  Medication Reason   esomeprazole (NEXIUM) 40 MG capsule      Start the following medications: No orders of the defined types were placed in this encounter.    Follow up: 1- month    Remember:   - We will refer you to social work/case management to help discuss any other financial assistance programs you may not have tried in the past.   - We will call you with more information regarding lab work for your urination problems as well as your ongoing rash.   - We will refer you to dermatology to help with the rash, and hopefully work on getting some of the labs before your appointment.   Should you have any questions or concerns please call the internal medicine clinic at [phone].     [name], MD PGY-1 Internal Medicine Teaching Progam [facility]

## 2023-10-28 NOTE — Progress Notes (Signed)
Established Patient Office Visit  Subjective   Patient ID: [name] Acosta, male    DOB: 1960/07/01  Age: 63 y.o. MRN: [id number]  Chief Complaint  Patient presents with   Follow-up    Patient is a 63 yo with a past medical history stated below who presents today for follow-up for HTN, T2DM, polydipsia, and rash/non-specific skin eruption. Please see problem based assessment and plan for additional details.      Past Medical History:  Diagnosis Date   Accidental fall from ladder 12/10/2018   Acute pain of right shoulder 11/26/2018   Arthritis    Bilateral leg pain 09/24/2019   Bilateral lower extremity edema 06/05/2018   Chest pain 05/14/2016   Chest pain    Chronic pain of left ankle 08/01/2011   S/p reconstruction surgery in 1999.    Dark stools 01/12/2020   DM (diabetes mellitus) ([facility])    Dyslipidemia 10/18/2006       Dyspnea on exertion 06/05/2018   Dyspnea on exertion   Eye redness 02/12/2019   Hematuria 08/01/2011   Check UA with next visit. Urine cytology in 2009 and 2012- negative. 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 0.3 mm  renal calculus in lower pole of right kidney right kidney seen in renal ultrasound in 2009. History of renal calculus,s/p lithotripsy in 2002 Cystoscopy in 2006 was negative.     Hyperlipidemia    Hypertension    Left leg swelling 01/24/2016   Left leg swelling/pain   Polydipsia 09/07/2020   Prostate disorder    Renal calculus    Right hip pain 12/12/2015   Sleep apnea    Strain of lumbar paraspinal muscle 12/15/2020      ROS    Objective:     BP 139/83 (BP Location: Left Arm, Patient Position: Sitting, Cuff Size: Normal)   Pulse 93   Temp 98.1 F (36.7 C) (Oral)   Resp (!) 28   Ht 5\' 9"  (1.753 m)   Wt 182 lb 14.4 oz (83 kg)   SpO2 96% Comment: room air  BMI 27.01 kg/m  BP Readings from Last 3 Encounters:  10/28/23 139/83  07/26/23 136/82  04/26/23 110/73   Wt Readings from Last 3 Encounters:  10/28/23 182 lb 14.4 oz (83 kg)  07/26/23  192 lb 3.2 oz (87.2 kg)  04/26/23 196 lb 8 oz (89.1 kg)      Physical Exam   Results for orders placed or performed in visit on 10/28/23  Glucose, capillary  Result Value Ref Range   Glucose-Capillary 123 (H) 70 - 99 mg/dL  POC Hbg U2V  Result Value Ref Range   Hemoglobin A1C 5.8 (A) 4.0 - 5.6 %   HbA1c POC (<> result, manual entry)     HbA1c, POC (prediabetic range)     HbA1c, POC (controlled diabetic range)      The 10-year ASCVD risk score (Arnett DK, et al., 2019) is: 34.6%    Assessment & Plan:   Problem List Items Addressed This Visit       Endocrine   Controlled diabetes mellitus type II without complication (HCC) - Primary   Relevant Orders   Microalbumin / Creatinine Urine Ratio   POC Hbg A1C (Completed)   AMB Referral [facility] Care Management     Musculoskeletal and Integument   Rash and nonspecific skin eruption   Relevant Orders   AMB Referral [facility] Care Management     Other   Polydipsia   Does not have health insurance  Relevant Orders   AMB Referral [facility] Care Management    Return in about 4 weeks (around 11/25/2023) for Labs for rash, polydipsia, SW/insurance follow up.  Patient seen with Dr. Lafonda Mosses.   Fredi Geiler Colbert Coyer, MD

## 2023-10-29 NOTE — Assessment & Plan Note (Signed)
Patient not open to further discussion regarding Orange Card application but willing to discuss other options further with social Designer, industrial/product. Placed ambulatory referral today.

## 2023-10-29 NOTE — Assessment & Plan Note (Signed)
Patient with chronic polydipsia/polyuria following pituitary adenoma/transphenoidal resection in 1988. States DDAVP therapy was helpful. Currently patient is managing polyuria by limiting his water intake. During discussion of self pay costs and need for further workup, patient seemed more interested in prioritizing workup for rash at this time. Will follow up again at upcoming visit once patient has been able to discuss other financial support options, including BMP (last one 01/25/23 WNL), and UA.

## 2023-10-29 NOTE — Assessment & Plan Note (Addendum)
Patient's Hgb A1c 5.8% today. Started on metformin monotherapy at [facility] visit on 04/26/23. Has been taking metformin 500 mg daily consistently, tolerating well.  - continue metformin (glucophage-xr) 500 mg daily

## 2023-10-29 NOTE — Progress Notes (Addendum)
Internal Medicine Clinic Attending  I was physically present during the key portions of the resident provided service and participated in the medical decision making of patient's management care. I reviewed pertinent patient test results.  The assessment, diagnosis, and plan were formulated together and I agree with the documentation in the resident's note.  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 Dr Justin Mend worked to find the cost of HCV, HIV, and porphyria testing, and patient will work on a payment plan to get these done.  If total serum or urine porphyrins are elevated, then I think he will need Derm or Allergy to help guide additional testing. If not elevated, this would rule out porphyria.  I don't want to start phlebotomy or hydroxychloroquine without confirming dx.  Appreciate [facility]BC[employer] assistance in finding community resources for this patient to help with getting him health insurance

## 2023-10-29 NOTE — Assessment & Plan Note (Addendum)
Patient with history of rash that seems to be photosensitive. 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 Discussed possible lab work and dermatology referral (for biopsy) with patient, including self pay costs for HCV/HIV ($107 + $168), and serum/urine porphyrins ($304 + $124). Patient previously HCV negative 9 years ago. Patient agreed to discuss possible financial assistance options with SW. Will follow up in 1 month, at which point patient may decide to proceed with lab work. Patient instructed to maintain wounds clean, dry, and out of the sun for now.  - Referral to [facility] care management SW for help with financial assistance/insurance coverage - Defer lab workup and dermatology referral until discussion with patient at next visit

## 2023-11-15 ENCOUNTER — Other Ambulatory Visit: Payer: Self-pay | Admitting: Student

## 2023-11-15 DIAGNOSIS — M545 Low back pain, unspecified: Secondary | ICD-10-CM

## 2023-11-20 ENCOUNTER — Other Ambulatory Visit (HOSPITAL_COMMUNITY): Payer: Self-pay

## 2023-11-21 ENCOUNTER — Other Ambulatory Visit: Payer: Self-pay | Admitting: Student

## 2023-11-21 DIAGNOSIS — Z5971 Insufficient health insurance coverage: Secondary | ICD-10-CM

## 2023-12-02 ENCOUNTER — Encounter: Payer: Self-pay | Admitting: Student

## 2023-12-02 ENCOUNTER — Ambulatory Visit (INDEPENDENT_AMBULATORY_CARE_PROVIDER_SITE_OTHER): Payer: Self-pay | Admitting: Student

## 2023-12-02 ENCOUNTER — Other Ambulatory Visit: Payer: Self-pay

## 2023-12-02 VITALS — BP 128/64 | HR 110 | Temp 97.8°F | Ht 69.0 in | Wt 195.2 lb

## 2023-12-02 DIAGNOSIS — I10 Essential (primary) hypertension: Secondary | ICD-10-CM

## 2023-12-02 DIAGNOSIS — E119 Type 2 diabetes mellitus without complications: Secondary | ICD-10-CM

## 2023-12-02 DIAGNOSIS — R21 Rash and other nonspecific skin eruption: Secondary | ICD-10-CM

## 2023-12-02 NOTE — Progress Notes (Signed)
CC: urine, rash follow up  HPI:  [name] is a 63 y.o. male living with a history stated below and presents today for rash follow up. Please see problem based assessment and plan for additional details.  Past Medical History:  Diagnosis Date   Accidental fall from ladder 12/10/2018   Acute pain of right shoulder 11/26/2018   Arthritis    Bilateral leg pain 09/24/2019   Bilateral lower extremity edema 06/05/2018   Chest pain 05/14/2016   Chest pain    Chronic pain of left ankle 08/01/2011   S/p reconstruction surgery in 1999.    Dark stools 01/12/2020   DM (diabetes mellitus) (HCC)    Dyslipidemia 10/18/2006       Dyspnea on exertion 06/05/2018   Dyspnea on exertion   Eye redness 02/12/2019   Hematuria 08/01/2011   Check UA with next visit. Urine cytology in 2009 and 2012- negative. 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 0.3 mm  renal calculus in lower pole of right kidney right kidney seen in renal ultrasound in 2009. History of renal calculus,s/p lithotripsy in 2002 Cystoscopy in 2006 was negative.     Hyperlipidemia    Hypertension    Left leg swelling 01/24/2016   Left leg swelling/pain   Polydipsia 09/07/2020   Prostate disorder    Renal calculus    Right hip pain 12/12/2015   Sleep apnea    Strain of lumbar paraspinal muscle 12/15/2020    Current Outpatient Medications on File Prior to Visit  Medication Sig Dispense Refill   Cholecalciferol (VITAMIN D-3) 1000 units CAPS Take 1 capsule by mouth daily.     doxazosin (CARDURA) 8 MG tablet Take 1 tablet (8 mg total) by mouth daily with breakfast. 90 tablet 1   glucose blood (ACCU-CHEK AVIVA) test strip Test blood glucose up to twice daily before meals. The patient is not insulin requiring, ICD 10 code E11.9. 100 each 11   guaifenesin (HUMIBID E) 400 MG TABS tablet Take 400 mg by mouth 2 (two) times daily.     lisinopril (ZESTRIL) 10 MG tablet Take 1 tablet (10 mg total) by mouth daily. 90 tablet 3   loperamide (IMODIUM) 2 MG  capsule Take 2 mg by mouth as needed for diarrhea or loose stools.     loratadine (CLARITIN) 10 MG tablet Take 10 mg by mouth 2 (two) times daily.     metFORMIN (GLUCOPHAGE-XR) 500 MG 24 hr tablet Take 1 tablet (500 mg total) by mouth daily with breakfast. 30 tablet 11   nicotine (NICODERM CQ - DOSED IN MG/24 HOURS) 21 mg/24hr patch Place 1 patch (21 mg total) onto the skin daily. 14 patch 0   nitroGLYCERIN (NITROSTAT) 0.4 MG SL tablet Place 1 tablet (0.4 mg total) under the tongue every 5 (five) minutes as needed for chest pain. (Patient not taking: Reported on 02/26/2020) 100 tablet 3   oxyCODONE-acetaminophen (PERCOCET) 7.5-325 MG tablet Take 1 tablet by mouth every 8 (eight) hours as needed for severe pain (pain score 7-10). 90 tablet 0   pravastatin (PRAVACHOL) 20 MG tablet TAKE ONE TABLET BY MOUTH AT BEDTIME 90 tablet 2   SUMAtriptan (IMITREX) 6 MG/0.5ML SOLN injection INJECT 0.5 ML (6 MG TOTAL) INTO THE SKINEVERY 2 HOURS AS NEEDED FOR MIGRAINE OR HEADACHE. MAY REPEAT IN 2 HOURS IF HEADACHE PERSISTS OR RECU (Patient taking differently: as needed. ) 2.5 mL 2   No current facility-administered medications on file prior to visit.   Family History  Problem  Relation Age of Onset   Diabetes Mother    Breast cancer Mother    Stroke Sister    Diabetes Maternal Grandmother    Heart disease Maternal Grandmother    Breast cancer Maternal Grandmother    Social History   Socioeconomic History   Marital status: Legally Separated    Spouse name: Not on file   Number of children: 1   Years of education: Not on file   Highest education level: Not on file  Occupational History   Occupation: [employer] tech  Tobacco Use   Smoking status: Every Day    Current packs/day: 1.00    Average packs/day: 1 pack/day for 44.0 years (44.0 ttl pk-yrs)    Types: Cigarettes   Smokeless tobacco: Never   Tobacco comments:    1pk per day  Vaping Use   Vaping status: Never Used  Substance and Sexual Activity    Alcohol use: No    Alcohol/week: 0.0 standard drinks of alcohol    Comment: Quit drinking in 1988   Drug use: Not on file   Sexual activity: Not on file  Other Topics Concern   Not on file  Social History Narrative   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[employer], Lobbyist   Lives by himself   Smoker   No drugs or alcohol   Social Determinants of Health   Financial Resource Strain: High Risk (07/06/2021)   Overall Financial Resource Strain (CARDIA)    Difficulty of Paying Living Expenses: Very hard  Food Insecurity: No Food Insecurity (07/06/2021)   Hunger Vital Sign    Worried About Running Out of Food in the Last Year: Never true    Ran Out of Food in the Last Year: Never true  Transportation Needs: No Transportation Needs (07/06/2021)   PRAPARE - Administrator, Civil Service (Medical): No    Lack of Transportation (Non-Medical): No  Physical Activity: Not on file  Stress: Stress Concern Present (12/06/2020)   [employer] of Occupational Health - Occupational Stress Questionnaire    Feeling of Stress : To some extent  Social Connections: Not on file  Intimate Partner Violence: Not on file   Review of Systems: ROS negative except for what is noted on the assessment and plan.  Vitals:   12/02/23 1030  BP: 128/64  Pulse: (!) 110  Temp: 97.8 F (36.6 C)  TempSrc: Oral  SpO2: 95%  Weight: 195 lb 3.2 oz (88.5 kg)  Height: 5\' 9"  (1.753 m)   Physical Exam: Constitutional: in work clothes, no acute distress Cardiovascular: regular rate and rhythm, no m/r/g; no LE edema Pulmonary/Chest: CTA bilaterally, normal respiratory effort Abdominal: soft, non-tender, non-distended MSK: normal bulk and tone Neurological: alert & oriented x 3, no focal deficits Skin: scattered scabs along dorsal aspects of the hands and forearms up to the elbows, see media tab. These are the locations of previous blisters that have broken. Dusky appearance of the skin is mostly due to visible dirt from  working. No lesions or rash above the elbows.   Assessment & Plan:   Patient seen with Dr. Sol Blazing  Rash and nonspecific skin eruption Patient has a long-standing history of a rash in photosensitive areas, predominantly on the dorsal aspect of his hands and forearms up to the elbows.  He states that it itches and hurts occasionally, blistering up and then scabbing over.  I uploaded new images to the media tab. It began ~4 years ago. He has previously tried topical steroids on it, which  he reports did not help. He now just uses rubbing alcohol, which he feels helps a little bit. He has also been using bandages and antibiotic ointment on his leg lesions, which he states are improved but would not let me examine. He denies any systemic symptoms, recent illnesses, injection drug use. He has been sexually active but denies any known exposures.   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  He states that he has saved up $300 to pay for tests today, so we will go ahead and do HIV/hepatitis C tests. His last HIV test was ~15 years ago, Hepatitis C test was ~9 years ago, both negative. I feel this would be the best use of his money, as we could directly treat the root issue if these are positive. We will hold off on urine porphyrin tests for now given the cost. I have encouraged him to apply for medicaid and have given him the instructions to do so. We will follow up on this at his next visit. Of note, he had previous negative experiences with the [facility] card/financial assistance through our clinic, so I would not recommend bringing that up to him in the future.   Essential hypertension Blood pressure within normal limits today, at 128/64. He is taking lisinopril 10 mg daily. No concerns at this time. Will continue lisinopril 10 mg daily and recheck BP at next visit.   Controlled diabetes  mellitus type II without complication (HCC) A1c at the end of October was 5.8%. He is taking metformin 500 mg daily with no concerns or side effects. Will continue metformin monotherapy at this dose and recheck A1c at next visit.   Annett Fabian, MD  [facility] Internal Medicine, PGY-1 Phone: [phone] Date 12/02/2023 Time 2:24 PM

## 2023-12-02 NOTE — Assessment & Plan Note (Addendum)
Patient has a long-standing history of a rash in photosensitive areas, predominantly on the dorsal aspect of his hands and forearms up to the elbows.  He states that it itches and hurts occasionally, blistering up and then scabbing over.  I uploaded new images to the media tab. It began ~4 years ago. He has previously tried topical steroids on it, which he reports did not help. He now just uses rubbing alcohol, which he feels helps a little bit. He has also been using bandages and antibiotic ointment on his leg lesions, which he states are improved but would not let me examine. He denies any systemic symptoms, recent illnesses, injection drug use. He has been sexually active but denies any known exposures.   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  He states that he has saved up $300 to pay for tests today, so we will go ahead and do HIV/hepatitis C tests. His last HIV test was ~15 years ago, Hepatitis C test was ~9 years ago, both negative. I feel this would be the best use of his money, as we could directly treat the root issue if these are positive. We will hold off on urine porphyrin tests for now given the cost. I have encouraged him to apply for medicaid and have given him the instructions to do so. We will follow up on this at his next visit. Of note, he had previous negative experiences with the [facility] card/financial assistance through our clinic, so I would not recommend bringing that up to him in the future.

## 2023-12-02 NOTE — Assessment & Plan Note (Signed)
A1c at the end of October was 5.8%. He is taking metformin 500 mg daily with no concerns or side effects. Will continue metformin monotherapy at this dose and recheck A1c at next visit.

## 2023-12-02 NOTE — Patient Instructions (Signed)
Thank you, [name] for allowing [place] to provide your care today. Today we discussed your rash.  As we discussed, we will collect an HIV and Hepatitis C test today. I will call you as soon as we have the results.   I highly recommend applying for Medicaid, as it will cover most of your healthcare costs moving forward. Please feel free to call our clinic if you have any questions about the application process.     I have ordered the following labs for you:   Lab Orders         HIV antibody (with reflex)         Hepatitis C antibody, reflex       Follow up: 3 months   We look forward to seeing you next time. Please call our clinic at [phone] if you have any questions or concerns. The best time to call is Monday-Friday from 9am-4pm, but there is someone available 24/7. If after hours or the weekend, call the main hospital number and ask for the Internal Medicine Resident On-Call. If you need medication refills, please notify your pharmacy one week in advance and they will send [place] a request.   Thank you for trusting me with your care. Wishing you the best!   Annett Fabian, MD [facility] Internal Medicine Center

## 2023-12-02 NOTE — Assessment & Plan Note (Signed)
Blood pressure within normal limits today, at 128/64. He is taking lisinopril 10 mg daily. No concerns at this time. Will continue lisinopril 10 mg daily and recheck BP at next visit.

## 2023-12-03 LAB — HCV AB W REFLEX TO QUANT PCR: HCV Ab: NONREACTIVE

## 2023-12-03 LAB — HIV ANTIBODY (ROUTINE TESTING W REFLEX): HIV Screen 4th Generation wRfx: NONREACTIVE

## 2023-12-03 LAB — HCV INTERPRETATION

## 2023-12-10 NOTE — Progress Notes (Signed)
Internal Medicine Clinic Attending  I was physically present during the key portions of the resident provided service and participated in the medical decision making of patient's management care. I reviewed pertinent patient test results.  The assessment, diagnosis, and plan were formulated together and I agree with the documentation in the resident's note.  Evaluation limited by patient's financial situation. We have discussed this in detail with him today and offered testing. Through shared decision-making we have decided to move forward with HIV and hep C testing. Patient is agreeable to start the Medicaid application process after which we will hopefully be able to refer to dermatology, obtain biopsy, further testing, etc.   Dickie La, MD

## 2023-12-13 ENCOUNTER — Other Ambulatory Visit: Payer: Self-pay | Admitting: Internal Medicine

## 2023-12-13 DIAGNOSIS — M545 Low back pain, unspecified: Secondary | ICD-10-CM

## 2023-12-16 ENCOUNTER — Other Ambulatory Visit: Payer: Self-pay

## 2023-12-16 DIAGNOSIS — G8929 Other chronic pain: Secondary | ICD-10-CM

## 2023-12-16 MED ORDER — OXYCODONE-ACETAMINOPHEN 7.5-325 MG PO TABS: 1.0000 | ORAL_TABLET | Freq: Three times a day (TID) | ORAL | 0 refills | Status: DC | PRN

## 2023-12-16 NOTE — Telephone Encounter (Signed)
Patient called requesting a rx refill on his pain medication since we will be closed during the holidays, he wants to make sure he has his medications.

## 2023-12-18 ENCOUNTER — Other Ambulatory Visit (HOSPITAL_COMMUNITY): Payer: Self-pay

## 2023-12-30 ENCOUNTER — Ambulatory Visit (INDEPENDENT_AMBULATORY_CARE_PROVIDER_SITE_OTHER): Payer: Self-pay | Admitting: Licensed Clinical Social Worker

## 2023-12-30 DIAGNOSIS — F1190 Opioid use, unspecified, uncomplicated: Secondary | ICD-10-CM

## 2023-12-30 NOTE — BH Specialist Note (Signed)
Integrated Behavioral Health via Telemedicine Visit  12/30/2023 CAMBER CHOUDHURY [id number]  Number of Integrated Behavioral Health Clinician visits: 1- Initial Visit  Session Start time: 1420   Session End time: 1425  Total time in minutes: 5   Referring Provider: PCP Patient/Family location: Home [facility] Provider location: Office All persons participating in visit: [facility] and Paitent Types of Service: Introduction only  I connected with [name] via  Telephone or  and verified that I am speaking with the correct person using two identifiers. Discussed confidentiality: Yes   I discussed the limitations of telemedicine and the availability of in person appointments.  Discussed there is a possibility of technology failure and discussed alternative modes of communication if that failure occurs.  I discussed that engaging in this telemedicine visit, they consent to the provision of behavioral healthcare and the services will be billed under their insurance.  Patient and/or legal guardian expressed understanding and consented to Telemedicine visit: Yes   Presenting Concerns: Patient and/or family reports the following symptoms/concerns: [facility] introduced self, explained roll and benefits of BHC .Patient declined wanting services at this time. Patient advised he is doing well and "holding his on". [facility] advised patient to contact office if anything changes and patient agreed.   Christen Butter, MSW, LCSW-A She/Her Behavioral Health Clinician [facility]  Internal Medicine Center Direct Dial:[phone]  Fax [phone] Main Office Phone: [phone] [address] [place] [place], [place] [address] Website: [facility] Internal Medicine [facility]  [place], [place]  [place]

## 2024-01-08 NOTE — Telephone Encounter (Signed)
 Dr Sherrilee Gilles this is an old refill request and I do not have the authority to refuse it so it will no longer be on triage's workque. Can you refuse this request?

## 2024-01-20 ENCOUNTER — Other Ambulatory Visit (HOSPITAL_COMMUNITY): Payer: Self-pay

## 2024-01-20 ENCOUNTER — Other Ambulatory Visit: Payer: Self-pay | Admitting: Student

## 2024-01-20 DIAGNOSIS — M545 Low back pain, unspecified: Secondary | ICD-10-CM

## 2024-01-21 ENCOUNTER — Other Ambulatory Visit: Payer: Self-pay

## 2024-01-21 DIAGNOSIS — E785 Hyperlipidemia, unspecified: Secondary | ICD-10-CM

## 2024-01-21 DIAGNOSIS — G8929 Other chronic pain: Secondary | ICD-10-CM

## 2024-01-21 MED ORDER — PRAVASTATIN SODIUM 20 MG PO TABS: 20.0000 mg | ORAL_TABLET | Freq: Every day | ORAL | 3 refills | Status: DC

## 2024-01-21 MED ORDER — OXYCODONE-ACETAMINOPHEN 7.5-325 MG PO TABS: 1.0000 | ORAL_TABLET | Freq: Three times a day (TID) | ORAL | 0 refills | Status: DC | PRN

## 2024-01-21 NOTE — Telephone Encounter (Signed)
Oxycodone rx was signed today. I do not have authorization to refuse this refuse request; sending to PCP. Thanks

## 2024-02-14 ENCOUNTER — Other Ambulatory Visit (HOSPITAL_COMMUNITY): Payer: Self-pay

## 2024-02-18 ENCOUNTER — Other Ambulatory Visit: Payer: Self-pay | Admitting: Student

## 2024-02-18 DIAGNOSIS — G8929 Other chronic pain: Secondary | ICD-10-CM

## 2024-02-18 NOTE — Telephone Encounter (Signed)
Last rx written - 01/21/24. Last OV -12/02/23. Next OV - 03/02/24. TOX - 01/19/22.

## 2024-03-02 ENCOUNTER — Encounter: Payer: Self-pay | Admitting: Student

## 2024-03-05 ENCOUNTER — Other Ambulatory Visit: Payer: Self-pay

## 2024-03-05 ENCOUNTER — Ambulatory Visit: Payer: Self-pay | Admitting: Student

## 2024-03-05 ENCOUNTER — Encounter: Payer: Self-pay | Admitting: Student

## 2024-03-05 VITALS — BP 114/95 | HR 105 | Temp 97.7°F | Ht 69.0 in | Wt 187.2 lb

## 2024-03-05 DIAGNOSIS — E119 Type 2 diabetes mellitus without complications: Secondary | ICD-10-CM

## 2024-03-05 DIAGNOSIS — Z7984 Long term (current) use of oral hypoglycemic drugs: Secondary | ICD-10-CM

## 2024-03-05 DIAGNOSIS — I10 Essential (primary) hypertension: Secondary | ICD-10-CM

## 2024-03-05 DIAGNOSIS — R310 Gross hematuria: Secondary | ICD-10-CM

## 2024-03-05 DIAGNOSIS — Z0000 Encounter for general adult medical examination without abnormal findings: Secondary | ICD-10-CM

## 2024-03-05 DIAGNOSIS — N400 Enlarged prostate without lower urinary tract symptoms: Secondary | ICD-10-CM

## 2024-03-05 LAB — POCT GLYCOSYLATED HEMOGLOBIN (HGB A1C): Hemoglobin A1C: 6.1 % — AB (ref 4.0–5.6)

## 2024-03-05 LAB — GLUCOSE, CAPILLARY: Glucose-Capillary: 111 mg/dL — ABNORMAL HIGH (ref 70–99)

## 2024-03-05 MED ORDER — TAMSULOSIN HCL 0.4 MG PO CAPS: 0.4000 mg | ORAL_CAPSULE | Freq: Every day | ORAL | 2 refills | Status: DC

## 2024-03-05 NOTE — Patient Instructions (Addendum)
 Thank you, Mr.Jaizon H Kellenberger for allowing [place] to provide your care today.   As we discussed, we can try a different medication for your enlarged prostate and urinary symptoms called tamsulosin.  Start by taking 1 of these once a day for the next few weeks and see how it helps your nighttime urinary symptoms.  If you do not notice any improvement, you can start taking 2 of the tablets once daily.  Stop taking your doxazosin for now.   Our main goal is to apply for Medicaid so that you have access to more healthcare options.  Please let us know once you have done this.  I have ordered the following labs for you:  Lab Orders         Glucose, capillary         POC Hbg A1C      I have ordered the following medication/changed the following medications:   Stop the following medications: Medications Discontinued During This Encounter  Medication Reason   doxazosin (CARDURA) 8 MG tablet Change in therapy    Start the following medications: Meds ordered this encounter  Medications   tamsulosin (FLOMAX) 0.4 MG CAPS capsule    Sig: Take 1 capsule (0.4 mg total) by mouth daily.    Dispense:  90 capsule    Refill:  2     Follow up: 3 months   We look forward to seeing you next time. Please call our clinic at [phone] if you have any questions or concerns. The best time to call is Monday-Friday from 9am-4pm, but there is someone available 24/7. If after hours or the weekend, call the main hospital number and ask for the Internal Medicine Resident On-Call. If you need medication refills, please notify your pharmacy one week in advance and they will send [place] a request.   Thank you for trusting me with your care. Wishing you the best!   Annett Fabian, MD [facility] Internal Medicine Center

## 2024-03-05 NOTE — Assessment & Plan Note (Addendum)
 He lost his Medicaid paperwork, so still is a self-pay patient.  He does not want to get any unnecessary labs or treatments.  Spoke with him about the importance of getting Medicaid given his various ailments.  He needs a dermatology referral for his skin, a urology referral given his BPH and ongoing hematuria, and routine healthcare maintenance including cancer screenings.  He is quite distrustful of the medical establishment and physicians.  I reiterated the importance of him applying to Medicaid so that we have access to more laboratory and treatment options for him.  Medicaid paperwork given.

## 2024-03-05 NOTE — Progress Notes (Signed)
 CC: Routine Follow Up for T2DM, rash after last office visit 12/2023  HPI:  [name] Acosta is a 64 y.o. male with the PMH listed below who presents to the clinic for follow up. Please see assessment and plan below for further details.  Past Medical History:  Diagnosis Date   Accidental fall from ladder 12/10/2018   Acute pain of right shoulder 11/26/2018   Arthritis    Bilateral leg pain 09/24/2019   Bilateral lower extremity edema 06/05/2018   Chest pain 05/14/2016   Chest pain    Chronic pain of left ankle 08/01/2011   S/p reconstruction surgery in 1999.    Dark stools 01/12/2020   DM (diabetes mellitus) (HCC)    Dyslipidemia 10/18/2006       Dyspnea on exertion 06/05/2018   Dyspnea on exertion   Eye redness 02/12/2019   Hematuria 08/01/2011   Check UA with next visit. Urine cytology in 2009 and 2012- negative. 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 0.3 mm  renal calculus in lower pole of right kidney right kidney seen in renal ultrasound in 2009. History of renal calculus,s/p lithotripsy in 2002 Cystoscopy in 2006 was negative.     Hyperlipidemia    Hypertension    Left leg swelling 01/24/2016   Left leg swelling/pain   Polydipsia 09/07/2020   Prostate disorder    Renal calculus    Right hip pain 12/12/2015   Sleep apnea    Strain of lumbar paraspinal muscle 12/15/2020   Review of Systems:   Pertinent items noted in HPI and/or A&P.  Physical Exam:  Vitals:   03/05/24 1036  BP: (!) 114/95  Pulse: (!) 105  Temp: 97.7 F (36.5 C)  TempSrc: Oral  SpO2: 95%  Weight: 187 lb 3.2 oz (84.9 kg)  Height: 5\' 9"  (1.753 m)   Fatigued appearing older man in no acute distress Tachycardic, regular rhythm, no murmurs Lungs clear to auscultation bilaterally Rash on bilateral arms largely unchanged from previously (see media pics) No focal neurological deficits  Assessment & Plan:   Controlled diabetes mellitus type II without complication (HCC) Lab Results  Component Value Date    HGBA1C 6.1 (A) 03/05/2024   HGBA1C 5.8 (A) 10/28/2023   HGBA1C 5.7 (A) 04/26/2023    Prediabetes.  He is on metformin 500 mg daily with no concerns.  Since his hemoglobin A1c has been steady, we will spread out the frequency with which recheck it especially given his financial limitations.  No medication changes at this time.  We can consider increasing metformin at his next follow-up visit.  Essential hypertension BP Readings from Last 3 Encounters:  03/05/24 (!) 114/95  12/02/23 128/64  10/28/23 139/83    Blood pressure on the lower side today.  Taking lisinopril 10 mg daily.  Also on doxazosin which may be contributing.  No medication changes at this time.  No new labs given financial limitations.  Healthcare maintenance He lost his Medicaid paperwork, so still is a self-pay patient.  He does not want to get any unnecessary labs or treatments.  Spoke with him about the importance of getting Medicaid given his various ailments.  He needs a dermatology referral for his skin, a urology referral given his BPH and ongoing hematuria, and routine healthcare maintenance including cancer screenings.  He is quite distrustful of the medical establishment and physicians.  I reiterated the importance of him applying to Medicaid so that we have access to more laboratory and treatment options for him.  Medicaid paperwork given.  BPH (benign prostatic hyperplasia) He continues to have sensation of incomplete emptying as well as nocturia, waking up sometimes 8 times in the middle of the night to pee.  Previous history of BPH, started on doxazosin 8 mg daily for this and blood pressure control.  Blood pressure is now on the low end.  His symptoms have persisted despite the doxazosin.  Will switch him over to Flomax 0.4 mg daily, tapering up to 0.8 mg daily based on symptom relief.  Once he has Medicaid, we will refer him to urology, as he will need a prostate eval and possible repeat cystoscopy given his ongoing  hematuria.   Patient discussed with Dr. Marney Doctor, MD Internal Medicine Center Internal Medicine Resident PGY-1 Clinic Phone: [phone] Pager: [phone]

## 2024-03-05 NOTE — Assessment & Plan Note (Addendum)
 Lab Results  Component Value Date   HGBA1C 6.1 (A) 03/05/2024   HGBA1C 5.8 (A) 10/28/2023   HGBA1C 5.7 (A) 04/26/2023    Prediabetes.  He is on metformin 500 mg daily with no concerns.  Since his hemoglobin A1c has been steady, we will spread out the frequency with which recheck it especially given his financial limitations.  No medication changes at this time.  We can consider increasing metformin at his next follow-up visit.

## 2024-03-05 NOTE — Assessment & Plan Note (Signed)
 He continues to have sensation of incomplete emptying as well as nocturia, waking up sometimes 8 times in the middle of the night to pee.  Previous history of BPH, started on doxazosin 8 mg daily for this and blood pressure control.  Blood pressure is now on the low end.  His symptoms have persisted despite the doxazosin.  Will switch him over to Flomax 0.4 mg daily, tapering up to 0.8 mg daily based on symptom relief.  Once he has Medicaid, we will refer him to urology, as he will need a prostate eval and possible repeat cystoscopy given his ongoing hematuria.

## 2024-03-05 NOTE — Assessment & Plan Note (Addendum)
 BP Readings from Last 3 Encounters:  03/05/24 (!) 114/95  12/02/23 128/64  10/28/23 139/83    Blood pressure on the lower side today.  Taking lisinopril 10 mg daily.  Also on doxazosin which may be contributing.  No medication changes at this time.  No new labs given financial limitations.

## 2024-03-06 NOTE — Progress Notes (Signed)
 Internal Medicine Clinic Attending  Case discussed with the resident at the time of the visit.  We reviewed the resident's history and exam and pertinent patient test results.  I agree with the assessment, diagnosis, and plan of care documented in the resident's note.  Continued limitations in diagnostic evaluation given patient's uninsured status. Dr. Versie Starks has discussed the need for additional testing and associated cost, Mr. Capell wishes to avoid further costs at this time. We have encouraged him to apply for Medicaid as soon as possible.

## 2024-03-17 ENCOUNTER — Other Ambulatory Visit: Payer: Self-pay | Admitting: Student

## 2024-03-17 DIAGNOSIS — M545 Low back pain, unspecified: Secondary | ICD-10-CM

## 2024-03-18 ENCOUNTER — Other Ambulatory Visit (HOSPITAL_COMMUNITY): Payer: Self-pay

## 2024-04-15 ENCOUNTER — Other Ambulatory Visit: Payer: Self-pay | Admitting: Student

## 2024-04-15 DIAGNOSIS — M545 Low back pain, unspecified: Secondary | ICD-10-CM

## 2024-04-16 ENCOUNTER — Other Ambulatory Visit: Payer: Self-pay | Admitting: Student

## 2024-04-16 ENCOUNTER — Other Ambulatory Visit (HOSPITAL_COMMUNITY): Payer: Self-pay

## 2024-04-16 DIAGNOSIS — E119 Type 2 diabetes mellitus without complications: Secondary | ICD-10-CM

## 2024-04-16 MED ORDER — METFORMIN HCL ER 500 MG PO TB24
500.0000 mg | ORAL_TABLET | Freq: Every day | ORAL | 11 refills | Status: AC
  Filled 2024-04-16: qty 30, 30d supply, fill #0
  Filled 2024-05-13: qty 30, 30d supply, fill #1
  Filled 2024-06-05 – 2024-06-08 (×2): qty 30, 30d supply, fill #2
  Filled 2024-07-10: qty 30, 30d supply, fill #3
  Filled 2024-08-07: qty 30, 30d supply, fill #4
  Filled 2024-09-07: qty 30, 30d supply, fill #5
  Filled 2024-10-06: qty 30, 30d supply, fill #6
  Filled 2024-11-10: qty 30, 30d supply, fill #7
  Filled 2024-12-08: qty 30, 30d supply, fill #8
  Filled 2025-01-04: qty 30, 30d supply, fill #9
  Filled 2025-02-04: qty 30, 30d supply, fill #10

## 2024-04-20 ENCOUNTER — Telehealth: Payer: Self-pay | Admitting: *Deleted

## 2024-04-20 NOTE — Telephone Encounter (Signed)
 Copied from CRM [id number]. Topic: General - Other >> Apr 20, 2024 11:08 AM Carrielelia G wrote: Reason for CRM: Pt [name] Acosta would like to go back to taking the medication : doxazosin  (CARDURA ) 8 MG tablet [Pharmacy Med Name: DOXAZOSIN  MESYLATE 8MG  TABLET he states the medication  tamsulosin  (FLOMAX ) 0.4 MG CAPS capsule is not working for him.   Please advise

## 2024-04-21 ENCOUNTER — Ambulatory Visit: Payer: Self-pay

## 2024-04-21 NOTE — Telephone Encounter (Signed)
 Pharmacy called regarding patient's tamsulosin  (FLOMAX ) 0.4 mg prescription. Current prescription directions state to take 1 capsule daily; however, patient is reporting they were instructed to take 2 capsules daily.    Pharmacy is requesting provider clarification or change of direction

## 2024-04-21 NOTE — Telephone Encounter (Signed)
 Pt called and informed "Doxazosin  was stopped due to low blood pressure readings." Per Dr Hartwell Linea. I asked pt if he wanted to schedule an appt  to discuss; he said it takes him 7 days to get a ride. Pt already has an appt scheduled 5/2; he wait until this appt.

## 2024-04-22 ENCOUNTER — Other Ambulatory Visit: Payer: Self-pay | Admitting: Student

## 2024-04-22 DIAGNOSIS — N400 Enlarged prostate without lower urinary tract symptoms: Secondary | ICD-10-CM

## 2024-04-22 MED ORDER — TAMSULOSIN HCL 0.4 MG PO CAPS: 0.8000 mg | ORAL_CAPSULE | Freq: Every day | ORAL | 2 refills | Status: DC

## 2024-04-22 NOTE — Telephone Encounter (Signed)
 Pt was called / informed of new rx for Flomax  2 caps daily.

## 2024-04-22 NOTE — Telephone Encounter (Signed)
 Call from pt who stated he's out of Flomax . Stated he has been taking 2 tabs a day instead once a day.  Stated it's not working as well as Doxazosin  but it better than nothing. I told pt I will ask his doctor if he can change the rx.

## 2024-05-01 ENCOUNTER — Encounter: Payer: Self-pay | Admitting: Student

## 2024-05-13 ENCOUNTER — Other Ambulatory Visit (HOSPITAL_COMMUNITY): Payer: Self-pay

## 2024-05-15 ENCOUNTER — Other Ambulatory Visit: Payer: Self-pay | Admitting: Student

## 2024-05-15 DIAGNOSIS — G8929 Other chronic pain: Secondary | ICD-10-CM

## 2024-06-05 ENCOUNTER — Ambulatory Visit: Payer: Self-pay | Admitting: Student

## 2024-06-05 ENCOUNTER — Encounter: Payer: Self-pay | Admitting: Student

## 2024-06-05 ENCOUNTER — Other Ambulatory Visit (HOSPITAL_COMMUNITY): Payer: Self-pay

## 2024-06-05 ENCOUNTER — Other Ambulatory Visit: Payer: Self-pay

## 2024-06-05 VITALS — BP 133/71 | HR 88 | Temp 98.0°F | Ht 69.0 in | Wt 189.4 lb

## 2024-06-05 DIAGNOSIS — F17200 Nicotine dependence, unspecified, uncomplicated: Secondary | ICD-10-CM

## 2024-06-05 DIAGNOSIS — E119 Type 2 diabetes mellitus without complications: Secondary | ICD-10-CM

## 2024-06-05 DIAGNOSIS — F17210 Nicotine dependence, cigarettes, uncomplicated: Secondary | ICD-10-CM

## 2024-06-05 DIAGNOSIS — N401 Enlarged prostate with lower urinary tract symptoms: Secondary | ICD-10-CM

## 2024-06-05 DIAGNOSIS — I10 Essential (primary) hypertension: Secondary | ICD-10-CM

## 2024-06-05 DIAGNOSIS — F1190 Opioid use, unspecified, uncomplicated: Secondary | ICD-10-CM

## 2024-06-05 DIAGNOSIS — Z7984 Long term (current) use of oral hypoglycemic drugs: Secondary | ICD-10-CM

## 2024-06-05 DIAGNOSIS — Z5971 Insufficient health insurance coverage: Secondary | ICD-10-CM

## 2024-06-05 MED ORDER — DOXAZOSIN MESYLATE 8 MG PO TABS: 4.0000 mg | ORAL_TABLET | Freq: Every day | ORAL | 5 refills | Status: DC

## 2024-06-05 NOTE — Patient Instructions (Addendum)
 Thank you, [name] Acosta for allowing us  to provide your care today. Today we discussed   -Blood pressure good today. Continue lisinopril   -Will switch back to Doxazosin , start at 4 mg once a day then we can work towards 8 mg if needed  -Work on cutting back on smoking, that can help with your overall health.  -Work on getting Medicard!   Follow up: 1-3 months   Should you have any questions or concerns please call the internal medicine clinic at [phone].    Virlee Stroschein, D.O. [facility]

## 2024-06-05 NOTE — Assessment & Plan Note (Signed)
 Smokes 1-2 ppd for several years. Declined any NRT or medication at this time. Wants to quit on his own. Will continue smoking cessation counseling.

## 2024-06-05 NOTE — Assessment & Plan Note (Signed)
 Chronic. On percocet 7.5-325 mg Q8H PRN. No acute worsening or recent change. PDMP reviewed and appropriate. Last ToxAssure appropriate on 12/2021. Will need to obtain new ToxAssure at next visit if able to void.

## 2024-06-05 NOTE — Assessment & Plan Note (Signed)
 Status: controlled. T2DM with last A1c of 6.1 in 02/2024. Currently taking metformin  500 mg daily.   Plan -Continue metformin  -A1c every 6 months (~08/2024) -Ophthalmology referral: not insured -Urine ACR not insured (last time elevated, on ACEI) -LDL 107 in 2023, on pravastatin  20 mg

## 2024-06-05 NOTE — Assessment & Plan Note (Signed)
 BP 133/71. Reports taking lisinopril  10 mg daily. Last BMP 12/2022 with normal electrolytes and renal function. Declined repeat BMP today. Uninsured but applying for Medicaid and provided financial assistance application today.   Plan -Continue lisinopril   -BMP at future visit

## 2024-06-05 NOTE — Progress Notes (Signed)
 CC: HTN, T2DM  HPI:  [name] is a 64 y.o. male living with a history stated below and presents today for HTN, T2DM. Please see problem based assessment and plan for additional details.  Past Medical History:  Diagnosis Date   Accidental fall from ladder 12/10/2018   Acute pain of right shoulder 11/26/2018   Arthritis    Bilateral leg pain 09/24/2019   Bilateral lower extremity edema 06/05/2018   Chest pain 05/14/2016   Chest pain    Chronic pain of left ankle 08/01/2011   S/p reconstruction surgery in 1999.    Dark stools 01/12/2020   DM (diabetes mellitus) (HCC)    Dyslipidemia 10/18/2006       Dyspnea on exertion 06/05/2018   Dyspnea on exertion   Eye redness 02/12/2019   Hematuria 08/01/2011   Check UA with next visit. Urine cytology in 2009 and 2012- negative. 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 0.3 mm  renal calculus in lower pole of right kidney right kidney seen in renal ultrasound in 2009. History of renal calculus,s/p lithotripsy in 2002 Cystoscopy in 2006 was negative.     Hyperlipidemia    Hypertension    Left leg swelling 01/24/2016   Left leg swelling/pain   Polydipsia 09/07/2020   Prostate disorder    Renal calculus    Right hip pain 12/12/2015   Sleep apnea    Strain of lumbar paraspinal muscle 12/15/2020    Current Outpatient Medications on File Prior to Visit  Medication Sig Dispense Refill   Cholecalciferol (VITAMIN D -3) 1000 units CAPS Take 1 capsule by mouth daily.     glucose blood (ACCU-CHEK AVIVA) test strip Test blood glucose up to twice daily before meals. The patient is not insulin requiring, ICD 10 code E11.9. 100 each 11   guaifenesin (HUMIBID E) 400 MG TABS tablet Take 400 mg by mouth 2 (two) times daily.     lisinopril  (ZESTRIL ) 10 MG tablet Take 1 tablet (10 mg total) by mouth daily. 90 tablet 3   loperamide (IMODIUM) 2 MG capsule Take 2 mg by mouth as needed for diarrhea or loose stools.     loratadine (CLARITIN) 10 MG tablet Take 10 mg by mouth  2 (two) times daily.     metFORMIN  (GLUCOPHAGE -XR) 500 MG 24 hr tablet Take 1 tablet (500 mg total) by mouth daily with breakfast. 30 tablet 11   nicotine  (NICODERM CQ  - DOSED IN MG/24 HOURS) 21 mg/24hr patch Place 1 patch (21 mg total) onto the skin daily. 14 patch 0   nitroGLYCERIN  (NITROSTAT ) 0.4 MG SL tablet Place 1 tablet (0.4 mg total) under the tongue every 5 (five) minutes as needed for chest pain. (Patient not taking: Reported on 02/26/2020) 100 tablet 3   oxyCODONE -acetaminophen  (PERCOCET) 7.5-325 MG tablet Take 1 tablet by mouth every 8 (eight) hours as needed for severe pain (pain score 7-10). 90 tablet 0   pravastatin  (PRAVACHOL ) 20 MG tablet Take 1 tablet (20 mg total) by mouth at bedtime. 90 tablet 3   SUMAtriptan  (IMITREX ) 6 MG/0.5ML SOLN injection INJECT 0.5 ML (6 MG TOTAL) INTO THE SKINEVERY 2 HOURS AS NEEDED FOR MIGRAINE OR HEADACHE. MAY REPEAT IN 2 HOURS IF HEADACHE PERSISTS OR RECU (Patient taking differently: as needed. ) 2.5 mL 2   No current facility-administered medications on file prior to visit.    Family History  Problem Relation Age of Onset   Diabetes Mother    Breast cancer Mother    Stroke Sister  Diabetes Maternal Grandmother    Heart disease Maternal Grandmother    Breast cancer Maternal Grandmother     Social History   Socioeconomic History   Marital status: Legally Separated    Spouse name: Not on file   Number of children: 1   Years of education: Not on file   Highest education level: Not on file  Occupational History   Occupation: [employer] tech  Tobacco Use   Smoking status: Every Day    Current packs/day: 1.00    Average packs/day: 1 pack/day for 44.0 years (44.0 ttl pk-yrs)    Types: Cigarettes   Smokeless tobacco: Never   Tobacco comments:    1pk per day  Vaping Use   Vaping status: Never Used  Substance and Sexual Activity   Alcohol use: No    Alcohol/week: 0.0 standard drinks of alcohol    Comment: Quit drinking in 1988   Drug use:  Not on file   Sexual activity: Not on file  Other Topics Concern   Not on file  Social History Narrative   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[employer], Lobbyist   Lives by himself   Smoker   No drugs or alcohol   Social Drivers of Corporate investment banker Strain: High Risk (07/06/2021)   Overall Financial Resource Strain (CARDIA)    Difficulty of Paying Living Expenses: Very hard  Food Insecurity: No Food Insecurity (07/06/2021)   Hunger Vital Sign    Worried About Running Out of Food in the Last Year: Never true    Ran Out of Food in the Last Year: Never true  Transportation Needs: No Transportation Needs (07/06/2021)   PRAPARE - Administrator, Civil Service (Medical): No    Lack of Transportation (Non-Medical): No  Physical Activity: Not on file  Stress: Stress Concern Present (12/06/2020)   [employer] of Occupational Health - Occupational Stress Questionnaire    Feeling of Stress : To some extent  Social Connections: Not on file  Intimate Partner Violence: Not on file    Review of Systems: ROS negative except for what is noted on the assessment and plan.  Vitals:   06/05/24 1034 06/05/24 1107  BP: 133/71   Pulse: (!) 102 88  Temp: 98 F (36.7 C)   TempSrc: Oral   SpO2: 97%   Weight: 189 lb 6.4 oz (85.9 kg)   Height: 5\' 9"  (1.753 m)    Physical Exam: Constitutional: alert, chronically ill appearing male sitting in chair comfortably, in no acute distress Cardiovascular: regular rate and rhythm Pulmonary/Chest: normal work of breathing on room air, lungs clear to auscultation bilaterally Neurological: alert & oriented x 3  Assessment & Plan:   Essential hypertension BP 133/71. Reports taking lisinopril  10 mg daily. Last BMP 12/2022 with normal electrolytes and renal function. Declined repeat BMP today. Uninsured but applying for Medicaid and provided financial assistance application today.   Plan -Continue lisinopril   -BMP at future visit   Controlled  diabetes mellitus type II without complication (HCC) Status: controlled. T2DM with last A1c of 6.1 in 02/2024. Currently taking metformin  500 mg daily.   Plan -Continue metformin  -A1c every 6 months (~08/2024) -Ophthalmology referral: not insured -Urine ACR not insured (last time elevated, on ACEI) -LDL 107 in 2023, on pravastatin  20 mg   Tobacco use disorder Smokes 1-2 ppd for several years. Declined any NRT or medication at this time. Wants to quit on his own. Will continue smoking cessation counseling.   BPH (benign prostatic hyperplasia)  States the Flomax  does not help and has more side effects than taking doxazosin . States when he took doxazosin , denies any lightheadedness or dizziness. States tolerated doxazosin  well for numerous years. Does not want to continue Flomax . Discussed switch back to doxazosin  but lower dose first. BP 133/71 today.   Plan -Stop tamsulosin  -Restart doxazosin  4 mg daily, if needed titrate up to 8 mg with caution  -At some point will need urology referral pending Medicaid vs financial assistance app  Does not have health insurance Patient agreeable to financial assistance application today. Will follow up on that at future visit and about Medicaid status.   Chronic, continuous use of opioids Chronic. On percocet 7.5-325 mg Q8H PRN. No acute worsening or recent change. PDMP reviewed and appropriate. Last ToxAssure appropriate on 12/2021. Will need to obtain new ToxAssure at next visit if able to void.    Patient discussed with Dr. Juanna Norman, D.O. [facility] Internal Medicine, PGY-2 Phone: [phone] Date 06/05/2024 Time 1:27 PM

## 2024-06-05 NOTE — Assessment & Plan Note (Signed)
 States the Flomax  does not help and has more side effects than taking doxazosin . States when he took doxazosin , denies any lightheadedness or dizziness. States tolerated doxazosin  well for numerous years. Does not want to continue Flomax . Discussed switch back to doxazosin  but lower dose first. BP 133/71 today.   Plan -Stop tamsulosin  -Restart doxazosin  4 mg daily, if needed titrate up to 8 mg with caution  -At some point will need urology referral pending Medicaid vs financial assistance app

## 2024-06-05 NOTE — Assessment & Plan Note (Signed)
 Patient agreeable to financial assistance application today. Will follow up on that at future visit and about Medicaid status.

## 2024-06-08 ENCOUNTER — Other Ambulatory Visit (HOSPITAL_COMMUNITY): Payer: Self-pay

## 2024-06-08 ENCOUNTER — Telehealth: Payer: Self-pay | Admitting: Student

## 2024-06-08 NOTE — Telephone Encounter (Unsigned)
 Copied from CRM [id number]. Topic: Clinical - Prescription Issue >> Jun 08, 2024 10:19 AM Adrianna P wrote: Reason for CRM: Patient did not get a 90 day supply of doxazosin  (CARDURA ) 8 MG tablet. Also it only says take half on the pill on the prescription

## 2024-06-10 ENCOUNTER — Telehealth: Payer: Self-pay | Admitting: *Deleted

## 2024-06-10 NOTE — Progress Notes (Signed)
 Internal Medicine Clinic Attending  Case discussed with the resident at the time of the visit.  We reviewed the resident's history and exam and pertinent patient test results.  I agree with the assessment, diagnosis, and plan of care documented in the resident's note.

## 2024-06-10 NOTE — Telephone Encounter (Signed)
 Copied from CRM [id number]. Topic: Clinical - Medication Question >> Jun 10, 2024 11:32 AM Adrianna P wrote: Reason for CRM: Please call patient to discuss doxazosin  (CARDURA ) 8 MG tablet. The best number to reach him is [phone]

## 2024-06-11 MED ORDER — DOXAZOSIN MESYLATE 8 MG PO TABS: 8.0000 mg | ORAL_TABLET | Freq: Every day | ORAL | 3 refills | Status: AC

## 2024-06-15 ENCOUNTER — Other Ambulatory Visit: Payer: Self-pay | Admitting: Student

## 2024-06-15 DIAGNOSIS — M545 Low back pain, unspecified: Secondary | ICD-10-CM

## 2024-07-10 ENCOUNTER — Other Ambulatory Visit (HOSPITAL_COMMUNITY): Payer: Self-pay

## 2024-07-13 ENCOUNTER — Other Ambulatory Visit: Payer: Self-pay | Admitting: Student

## 2024-07-13 DIAGNOSIS — G8929 Other chronic pain: Secondary | ICD-10-CM

## 2024-07-13 NOTE — Telephone Encounter (Signed)
 Copied from CRM [id number]. Topic: Clinical - Medication Refill >> Jul 13, 2024 11:06 AM Carrielelia G wrote: Medication: oxyCODONE -acetaminophen  (PERCOCET) 7.5-325 MG tablet  Has the patient contacted their pharmacy? Yes (Agent: If no, request that the patient contact the pharmacy for the refill. If patient does not wish to contact the pharmacy document the reason why and proceed with request.) (Agent: If yes, when and what did the pharmacy advise?)  This is the patient's preferred pharmacy:  [facility] - [place], [place] - [address] [place] [address] Phone: [phone] Fax: [phone]   Is this the correct pharmacy for this prescription? Yes If no, delete pharmacy and type the correct one.    Is the patient out of the medication? No  (four days left)   Has the patient been seen for an appointment in the last year OR does the patient have an upcoming appointment? Yes  Can we respond through MyChart? No  Agent: Please be advised that Rx refills may take up to 3 business days. We ask that you follow-up with your pharmacy.

## 2024-07-13 NOTE — Telephone Encounter (Signed)
 Last rx written - 06/15/24. Last OV - 06/05/24. Next OV - 09/04/24. TOX - 01/19/22.

## 2024-08-07 ENCOUNTER — Other Ambulatory Visit (HOSPITAL_COMMUNITY): Payer: Self-pay

## 2024-08-11 ENCOUNTER — Other Ambulatory Visit: Payer: Self-pay | Admitting: Student

## 2024-08-11 DIAGNOSIS — G8929 Other chronic pain: Secondary | ICD-10-CM

## 2024-08-11 DIAGNOSIS — M545 Low back pain, unspecified: Secondary | ICD-10-CM

## 2024-09-04 ENCOUNTER — Other Ambulatory Visit: Payer: Self-pay | Admitting: Internal Medicine

## 2024-09-04 ENCOUNTER — Ambulatory Visit: Payer: Self-pay

## 2024-09-04 VITALS — BP 120/72 | HR 90 | Temp 98.4°F | Ht 69.0 in | Wt 183.4 lb

## 2024-09-04 DIAGNOSIS — I10 Essential (primary) hypertension: Secondary | ICD-10-CM

## 2024-09-04 DIAGNOSIS — Z7984 Long term (current) use of oral hypoglycemic drugs: Secondary | ICD-10-CM

## 2024-09-04 DIAGNOSIS — E119 Type 2 diabetes mellitus without complications: Secondary | ICD-10-CM

## 2024-09-04 DIAGNOSIS — F17210 Nicotine dependence, cigarettes, uncomplicated: Secondary | ICD-10-CM

## 2024-09-04 DIAGNOSIS — N400 Enlarged prostate without lower urinary tract symptoms: Secondary | ICD-10-CM

## 2024-09-04 DIAGNOSIS — Z5971 Insufficient health insurance coverage: Secondary | ICD-10-CM

## 2024-09-04 DIAGNOSIS — M545 Low back pain, unspecified: Secondary | ICD-10-CM

## 2024-09-04 LAB — POCT GLYCOSYLATED HEMOGLOBIN (HGB A1C): HbA1c, POC (controlled diabetic range): 5.6 % (ref 0.0–7.0)

## 2024-09-04 LAB — BASIC METABOLIC PANEL WITH GFR
Anion gap: 9 (ref 5–15)
BUN: 14 mg/dL (ref 8–23)
CO2: 25 mmol/L (ref 22–32)
Calcium: 9.2 mg/dL (ref 8.9–10.3)
Chloride: 107 mmol/L (ref 98–111)
Creatinine, Ser: 1.05 mg/dL (ref 0.61–1.24)
GFR, Estimated: >60 mL/min (ref >60)
Glucose, Bld: 121 mg/dL — ABNORMAL HIGH (ref 70–99)
Potassium: 3.7 mmol/L (ref 3.5–5.1)
Sodium: 141 mmol/L (ref 135–145)

## 2024-09-04 LAB — GLUCOSE, CAPILLARY: Glucose-Capillary: 124 mg/dL — ABNORMAL HIGH (ref 70–99)

## 2024-09-04 MED ORDER — OXYCODONE-ACETAMINOPHEN 7.5-325 MG PO TABS: 1.0000 | ORAL_TABLET | Freq: Three times a day (TID) | ORAL | 0 refills | Status: DC | PRN

## 2024-09-04 NOTE — Progress Notes (Signed)
 Established Patient Office Visit  Subjective   Patient ID: [name] Acosta, male    DOB: [birth year]  Age: 64 y.o. MRN: [id number]  Chief Complaint  Patient presents with   Back Pain   Knee Pain   Diabetes   [name] Acosta is a 64 year old male with a past medical history of HTN, T2DM, HLD, and BPH who presents today for an office follow up. Please see problem-based assessment and plan below for details.    Review of Systems  Constitutional: Negative.   HENT: Negative.    Respiratory: Negative.    Cardiovascular: Negative.   Gastrointestinal: Negative.   Genitourinary: Negative.   Musculoskeletal:  Positive for back pain.  Neurological: Negative.   Psychiatric/Behavioral: Negative.        Objective:    BP 120/72 (BP Location: Right Arm, Patient Position: Sitting)   Pulse 90   Temp 98.4 F (36.9 C) (Oral)   Ht 5' 9 (1.753 m)   Wt 183 lb 6.4 oz (83.2 kg)   SpO2 95%   BMI 27.08 kg/m  Physical Exam Constitutional:      Appearance: He is not ill-appearing.     Comments: Chronically thin-appearing  HENT:     Mouth/Throat:     Mouth: Mucous membranes are moist.  Cardiovascular:     Rate and Rhythm: Normal rate and regular rhythm.     Pulses: Normal pulses.     Heart sounds: Normal heart sounds.  Pulmonary:     Effort: Pulmonary effort is normal. No respiratory distress.     Breath sounds: Normal breath sounds.  Musculoskeletal:        General: No swelling or tenderness.  Skin:    General: Skin is warm and dry.     Findings: Lesion present.     Comments: Old excoriations and scabbing over the bilateral forearms and hands, all in different stages of healing.   Neurological:     General: No focal deficit present.     Mental Status: He is alert and oriented to person, place, and time.  Psychiatric:        Mood and Affect: Mood normal.        Behavior: Behavior normal.    The 10-year ASCVD risk score (Arnett DK, et al., 2019) is: 29.1%   Assessment & Plan:    Patient discussed with Dr. Mliss Pouch.   Essential Hypertension BP today 120/72. Patient is taking lisinopril  10mg  daily. Declined BMP at last OV due to cost. Will need BMP today given ACEI use, of which the patient is amenable to. He would like to ensure his kidney function is adequate given his concern of a remote history of a pituitary tumor removed in 1988 that affected his vasopressin levels at that time. - BMP today - Continue Lisinopril  10mg  daily  Type 2 Diabetes Mellitus, controlled Last A1c 6.1 in 02/2024. Patient due for A1c recheck today. Currently on Metformin  500mg  daily. Patient unable to get updated lipid panel, microalbumin/creatinine urine, or ophthalmology diabetic eye exam due to cost. Patient is currently on Pravastatin  20mg  for his HLD. - Continue Metformin  500mg  daily - Continue Pravastatin  20mg  daily  Tobacco Use Disorder Patient attempting to quit smoking on his own. Denied NRT or medication during last office visit. Has a history of 1-2ppd for over 40 years. He has been able to reduce cigarette intake to about 3-4 a day.  BPH Patient switched to Doxazosin  from Flomax  during last visit. Started at 4mg  daily, has since titrated  up to 8mg  daily. The patient does not report any side effects including hypotension, dry mouth, or syncopal episodes and states the medication helps him more than the Flomax  did. - Continue Doxazosin  8mg  daily  No Health Insurance Patient was given orange card application in 05/2024 during his last office visit. He was also working to obtain Medicaid coverage at that time. States he does not want to get the orange card despite it being financially beneficial to him due to a history of his application being outdated when it truly was not. He states the previous agent who worked with him to fill it out told him this and it was upsetting to the patient. He is still working on obtaining Medicaid but needs to finish collecting the paperwork in  order to send it in. He has, however, gotten a letter from social security that has informed him he will be eligible for Medicare once he turns 65 in 2026. I offered three times to attempt to help him with the orange card application so he can have some assistance with his payments, but the patient politely declined.   Chronic, continuous use of Opioids Last ToxAssure in 12/2021. Patient needs to have updated ToxAssure, however due to cost, we were unable to get one today. Percocet 7.5-325mg  Q8H PRN. PDMP reviewed and appropriate. Discussed with Dr. Trudy and will send Percocet for this month to prevent any withdrawal symptoms. - Percocet 7.5-325mg  daily for chronic moderate pain   [name], DO Internal Medicine Resident, PGY-1 [facility] 2:03 PM 09/04/2024

## 2024-09-04 NOTE — Patient Instructions (Addendum)
 Thank you, Mr.Nadia H Byard for allowing us  to provide your care today. Today we discussed your diabetes, blood pressure, and your prostate concerns.     Lab Orders         Glucose, capillary         Basic metabolic panel with GFR         POC Hbg A1C       I will call if any are abnormal. All of your labs can be accessed through My Chart.   My Chart Access: [email or web address]  Please follow-up in: 3 months for your A1c to be rechecked    We look forward to seeing you next time. Please call our clinic at [phone] if you have any questions or concerns. The best time to call is Monday-Friday from 9am-4pm, but there is someone available 24/7. If after hours or the weekend, call the main hospital number and ask for the Internal Medicine Resident On-Call. If you need medication refills, please notify your pharmacy one week in advance and they will send us  a request.   Thank you for letting us  take part in your care. Wishing you the best!  Sarayu Prevost, DO 09/04/2024, 2:09 PM [facility]

## 2024-09-07 ENCOUNTER — Other Ambulatory Visit (HOSPITAL_COMMUNITY): Payer: Self-pay

## 2024-09-09 ENCOUNTER — Other Ambulatory Visit (HOSPITAL_COMMUNITY): Payer: Self-pay

## 2024-09-14 ENCOUNTER — Telehealth: Payer: Self-pay | Admitting: *Deleted

## 2024-09-14 NOTE — Telephone Encounter (Unsigned)
 Copied from CRM [id number]. Topic: Clinical - Lab/Test Results >> Sep 14, 2024  3:35 PM Laurier C wrote: Reason for CRM: Patient would like a call back at [phone] to discuss his lab results

## 2024-09-18 NOTE — Telephone Encounter (Signed)
 Discussed normal BMP with the patient. He expressed wanting to discuss getting back on DDAVP after he was on this following pituitary surgery in the 1980s. 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 Discussed his prior attempts to apply for insurance and he does not want to try this again. He would rather wait until he is 68 and gets Medicaid. I offered an additional appointment to discuss the issues above and he would like to wait until his regular follow up in about 3 months.

## 2024-09-26 NOTE — Progress Notes (Signed)
 Internal Medicine Clinic Attending  Case discussed with the resident at the time of the visit.  We reviewed the resident's history and exam and pertinent patient test results.  I agree with the assessment, diagnosis, and plan of care documented in the resident's note.

## 2024-10-05 ENCOUNTER — Other Ambulatory Visit: Payer: Self-pay | Admitting: Student

## 2024-10-05 DIAGNOSIS — I10 Essential (primary) hypertension: Secondary | ICD-10-CM

## 2024-10-05 NOTE — Telephone Encounter (Signed)
 Medication sent to pharmacy

## 2024-10-06 ENCOUNTER — Other Ambulatory Visit (HOSPITAL_COMMUNITY): Payer: Self-pay

## 2024-10-09 ENCOUNTER — Other Ambulatory Visit: Payer: Self-pay | Admitting: Student

## 2024-10-09 DIAGNOSIS — M545 Low back pain, unspecified: Secondary | ICD-10-CM

## 2024-10-09 NOTE — Telephone Encounter (Unsigned)
 Copied from CRM [id number]. Topic: Clinical - Medication Refill >> Oct 09, 2024  1:24 PM [name] Acosta wrote: Medication: oxyCODONE -acetaminophen  (PERCOCET) 7.5-325 MG tablet  Has the patient contacted their pharmacy? Yes (Agent: If no, request that the patient contact the pharmacy for the refill. If patient does not wish to contact the pharmacy document the reason why and proceed with request.) (Agent: If yes, when and what did the pharmacy advise?) Pharmacy advised they sent a request with no response  This is the patient's preferred pharmacy:  [facility] - [place], [place] - [address] [place] [address] Phone: [phone] Fax: [phone]  Is this the correct pharmacy for this prescription? Yes If no, delete pharmacy and type the correct one.   Has the prescription been filled recently? No  Is the patient out of the medication? No  Has the patient been seen for an appointment in the last year OR does the patient have an upcoming appointment? Yes  Can we respond through MyChart? No  Agent: Please be advised that Rx refills may take up to 3 business days. We ask that you follow-up with your pharmacy.

## 2024-10-12 MED ORDER — OXYCODONE-ACETAMINOPHEN 7.5-325 MG PO TABS: 1.0000 | ORAL_TABLET | Freq: Three times a day (TID) | ORAL | 0 refills | Status: DC | PRN

## 2024-10-12 NOTE — Telephone Encounter (Signed)
 Patient called to check status. States has not received response. Is waiting until this is filled to pick up all his medication at one time because it costs him money to go ick up for transportation. Would like to be notified once this is complete. Advised up to 3 days TAT Thank You

## 2024-11-10 ENCOUNTER — Other Ambulatory Visit (HOSPITAL_COMMUNITY): Payer: Self-pay

## 2024-11-11 ENCOUNTER — Other Ambulatory Visit: Payer: Self-pay | Admitting: Student

## 2024-11-11 DIAGNOSIS — G8929 Other chronic pain: Secondary | ICD-10-CM

## 2024-12-08 ENCOUNTER — Other Ambulatory Visit: Payer: Self-pay

## 2024-12-08 ENCOUNTER — Other Ambulatory Visit (HOSPITAL_COMMUNITY): Payer: Self-pay

## 2024-12-08 DIAGNOSIS — M545 Low back pain, unspecified: Secondary | ICD-10-CM

## 2025-01-04 ENCOUNTER — Other Ambulatory Visit (HOSPITAL_COMMUNITY): Payer: Self-pay

## 2025-01-04 DIAGNOSIS — E785 Hyperlipidemia, unspecified: Secondary | ICD-10-CM

## 2025-01-04 DIAGNOSIS — M545 Low back pain, unspecified: Secondary | ICD-10-CM

## 2025-01-18 ENCOUNTER — Telehealth: Payer: Self-pay | Admitting: *Deleted

## 2025-01-18 NOTE — Telephone Encounter (Signed)
 Call from patient about getting a letter to verify that he cannot go in for [employer].   Patient has done a letter and submitted but also need to get a verification from his doctor.  Patient  has multiple issues that he is unable to climb stairs or sit for long periods.  Patient would like for the letter to be sent to the [facility].  Attention Jury Exclusion.  Patient's [place] # is [id number].  Address to the [facility].  [place] [employer] at [employer] [address], [place].  [id number].  Can also be sent per fax [facility] [email or web address] Patient has [employer]  02/08/2025.  Is unable to come in for an appointment or do a Televisit .

## 2025-01-18 NOTE — Telephone Encounter (Unsigned)
 Copied from CRM [id number]. Topic: General - Other >> Jan 18, 2025 11:15 AM [name] Acosta wrote: Reason for CRM: Patient called. Received [employer]. His request to not participate was denied due to did not give them enough information. Needs letter from Dr stating he cannot sit or walk for long periods of time with knee and back how it is. Thank You

## 2025-01-18 NOTE — Telephone Encounter (Signed)
 I called and spoke with pt regarding a letter for jury duty. Advised [name] Acosta to bring in the letter from jury duty, but pt states he has no transportation. Pt states he just need a letter to excused him from jury duty. I offered an appt, pt states he cannot come in. Pt asked to speak with Triage nurse. I transferred [name] Acosta to [name].

## 2025-01-19 NOTE — Telephone Encounter (Signed)
 I contacted pt to let him know is letter is ready for pick up. Pt states he will have someone to pick up the letter for him. Letter will be at the front desk. Advised pt to call back if he needs further assistance.

## 2025-01-19 NOTE — Telephone Encounter (Signed)
 Once letter is done and signed.  Give to [place] or [facility].

## 2025-01-19 NOTE — Telephone Encounter (Signed)
 Call to patient to inform him that a letter has been done  and will be faxed or mailed about his [name] Acosta/

## 2025-02-04 ENCOUNTER — Other Ambulatory Visit: Payer: Self-pay

## 2025-02-04 ENCOUNTER — Other Ambulatory Visit (HOSPITAL_COMMUNITY): Payer: Self-pay

## 2025-02-05 ENCOUNTER — Other Ambulatory Visit: Payer: Self-pay | Admitting: Student

## 2025-02-05 DIAGNOSIS — G8929 Other chronic pain: Secondary | ICD-10-CM
